# Patient Record
Sex: Female | Born: 1935 | Race: White | Hispanic: No | Marital: Single | Smoking: Never smoker
Health system: Southern US, Community
[De-identification: ages and names within clinical notes are randomized; demographics above are authoritative.]

## PROBLEM LIST (undated history)

## (undated) DIAGNOSIS — IMO0002 Reserved for concepts with insufficient information to code with codable children: Secondary | ICD-10-CM

## (undated) DIAGNOSIS — L982 Febrile neutrophilic dermatosis [Sweet]: Secondary | ICD-10-CM

## (undated) DIAGNOSIS — I4892 Unspecified atrial flutter: Secondary | ICD-10-CM

## (undated) DIAGNOSIS — C9210 Chronic myeloid leukemia, BCR/ABL-positive, not having achieved remission: Secondary | ICD-10-CM

## (undated) DIAGNOSIS — I5032 Chronic diastolic (congestive) heart failure: Secondary | ICD-10-CM

## (undated) DIAGNOSIS — J189 Pneumonia, unspecified organism: Secondary | ICD-10-CM

## (undated) DIAGNOSIS — E871 Hypo-osmolality and hyponatremia: Secondary | ICD-10-CM

## (undated) DIAGNOSIS — J849 Interstitial pulmonary disease, unspecified: Secondary | ICD-10-CM

## (undated) DIAGNOSIS — D75839 Thrombocytosis, unspecified: Secondary | ICD-10-CM

## (undated) DIAGNOSIS — D509 Iron deficiency anemia, unspecified: Secondary | ICD-10-CM

## (undated) DIAGNOSIS — D473 Essential (hemorrhagic) thrombocythemia: Secondary | ICD-10-CM

## (undated) HISTORY — PX: OTHER SURGICAL HISTORY: SHX169

## (undated) HISTORY — DX: Unspecified atrial flutter: I48.92

## (undated) HISTORY — DX: Pneumonia, unspecified organism: J18.9

## (undated) HISTORY — DX: Thrombocytosis, unspecified: D75.839

## (undated) HISTORY — DX: Iron deficiency anemia, unspecified: D50.9

## (undated) HISTORY — DX: Chronic myeloid leukemia, BCR/ABL-positive, not having achieved remission: C92.10

## (undated) HISTORY — DX: Interstitial pulmonary disease, unspecified: J84.9

## (undated) HISTORY — DX: Chronic diastolic (congestive) heart failure: I50.32

## (undated) HISTORY — DX: Essential (hemorrhagic) thrombocythemia: D47.3

## (undated) HISTORY — DX: Febrile neutrophilic dermatosis (sweet): L98.2

## (undated) HISTORY — DX: Hypo-osmolality and hyponatremia: E87.1

## (undated) HISTORY — DX: Reserved for concepts with insufficient information to code with codable children: IMO0002

---

## 2008-07-24 ENCOUNTER — Emergency Department (HOSPITAL_COMMUNITY): Admission: EM | Admit: 2008-07-24 | Discharge: 2008-07-24 | Payer: Self-pay | Admitting: Emergency Medicine

## 2010-12-06 ENCOUNTER — Other Ambulatory Visit: Payer: Self-pay | Admitting: Family Medicine

## 2010-12-06 ENCOUNTER — Ambulatory Visit
Admission: RE | Admit: 2010-12-06 | Discharge: 2010-12-06 | Disposition: A | Discharge status: Home / Self Care | Payer: Medicare Other | Source: Ambulatory Visit | Attending: Family Medicine | Admitting: Family Medicine

## 2010-12-06 DIAGNOSIS — J189 Pneumonia, unspecified organism: Secondary | ICD-10-CM

## 2010-12-08 ENCOUNTER — Inpatient Hospital Stay (HOSPITAL_COMMUNITY): Payer: Medicare Other

## 2010-12-08 ENCOUNTER — Emergency Department (HOSPITAL_COMMUNITY): Payer: Medicare Other

## 2010-12-08 ENCOUNTER — Inpatient Hospital Stay (HOSPITAL_COMMUNITY)
Admission: EM | Admit: 2010-12-08 | Discharge: 2010-12-11 | Disposition: A | Discharge status: Home / Self Care | Payer: Medicare Other | Source: Home / Self Care | Attending: Cardiology | Admitting: Cardiology

## 2010-12-08 DIAGNOSIS — G929 Unspecified toxic encephalopathy: Secondary | ICD-10-CM | POA: Diagnosis present

## 2010-12-08 DIAGNOSIS — Z7901 Long term (current) use of anticoagulants: Secondary | ICD-10-CM

## 2010-12-08 DIAGNOSIS — I4891 Unspecified atrial fibrillation: Secondary | ICD-10-CM | POA: Diagnosis present

## 2010-12-08 DIAGNOSIS — I5033 Acute on chronic diastolic (congestive) heart failure: Secondary | ICD-10-CM | POA: Diagnosis present

## 2010-12-08 DIAGNOSIS — E871 Hypo-osmolality and hyponatremia: Secondary | ICD-10-CM | POA: Diagnosis present

## 2010-12-08 DIAGNOSIS — G92 Toxic encephalopathy: Secondary | ICD-10-CM | POA: Diagnosis present

## 2010-12-08 DIAGNOSIS — I509 Heart failure, unspecified: Secondary | ICD-10-CM | POA: Diagnosis present

## 2010-12-08 DIAGNOSIS — D72829 Elevated white blood cell count, unspecified: Secondary | ICD-10-CM | POA: Diagnosis not present

## 2010-12-08 DIAGNOSIS — J189 Pneumonia, unspecified organism: Secondary | ICD-10-CM | POA: Diagnosis present

## 2010-12-08 DIAGNOSIS — D509 Iron deficiency anemia, unspecified: Secondary | ICD-10-CM | POA: Diagnosis present

## 2010-12-08 DIAGNOSIS — I959 Hypotension, unspecified: Secondary | ICD-10-CM | POA: Diagnosis present

## 2010-12-08 DIAGNOSIS — D473 Essential (hemorrhagic) thrombocythemia: Secondary | ICD-10-CM | POA: Diagnosis present

## 2010-12-08 DIAGNOSIS — J8410 Pulmonary fibrosis, unspecified: Secondary | ICD-10-CM | POA: Diagnosis present

## 2010-12-08 DIAGNOSIS — I4892 Unspecified atrial flutter: Secondary | ICD-10-CM | POA: Diagnosis present

## 2010-12-08 DIAGNOSIS — L538 Other specified erythematous conditions: Secondary | ICD-10-CM | POA: Diagnosis not present

## 2010-12-08 LAB — HEPATIC FUNCTION PANEL
ALT: 13 U/L (ref 0–35)
Alkaline Phosphatase: 82 U/L (ref 39–117)
Indirect Bilirubin: 0.4 mg/dL (ref 0.3–0.9)
Total Protein: 7.4 g/dL (ref 6.0–8.3)

## 2010-12-08 LAB — CBC
HCT: 30.1 % — ABNORMAL LOW (ref 36.0–46.0)
MCV: 80.7 fL (ref 78.0–100.0)
RBC: 3.73 MIL/uL — ABNORMAL LOW (ref 3.87–5.11)
WBC: 23.1 10*3/uL — ABNORMAL HIGH (ref 4.0–10.5)

## 2010-12-08 LAB — POCT CARDIAC MARKERS
CKMB, poc: 1.2 ng/mL (ref 1.0–8.0)
Troponin i, poc: <0.05 ng/mL (ref 0.00–0.09)

## 2010-12-08 LAB — CARDIAC PANEL(CRET KIN+CKTOT+MB+TROPI): Troponin I: <0.3 ng/mL (ref <0.30)

## 2010-12-08 LAB — BASIC METABOLIC PANEL
BUN: 22 mg/dL (ref 6–23)
GFR calc non Af Amer: >60 mL/min (ref >60)
Potassium: 4.8 mEq/L (ref 3.5–5.1)
Sodium: 131 mEq/L — ABNORMAL LOW (ref 135–145)

## 2010-12-08 LAB — APTT: aPTT: 32 seconds (ref 24–37)

## 2010-12-08 LAB — PROTIME-INR: Prothrombin Time: 14.9 seconds (ref 11.6–15.2)

## 2010-12-09 ENCOUNTER — Inpatient Hospital Stay (HOSPITAL_COMMUNITY): Payer: Medicare Other

## 2010-12-09 LAB — BASIC METABOLIC PANEL
CO2: 23 mEq/L (ref 19–32)
Calcium: 8.7 mg/dL (ref 8.4–10.5)
Chloride: 97 mEq/L (ref 96–112)
Glucose, Bld: 101 mg/dL — ABNORMAL HIGH (ref 70–99)
Potassium: 4.4 mEq/L (ref 3.5–5.1)
Sodium: 131 mEq/L — ABNORMAL LOW (ref 135–145)

## 2010-12-09 LAB — CARDIAC PANEL(CRET KIN+CKTOT+MB+TROPI)
CK, MB: 1.3 ng/mL (ref 0.3–4.0)
Relative Index: INVALID (ref 0.0–2.5)
Total CK: 42 U/L (ref 7–177)

## 2010-12-09 LAB — CBC
HCT: 30.4 % — ABNORMAL LOW (ref 36.0–46.0)
Hemoglobin: 9.8 g/dL — ABNORMAL LOW (ref 12.0–15.0)
MCHC: 32.2 g/dL (ref 30.0–36.0)
RBC: 3.75 MIL/uL — ABNORMAL LOW (ref 3.87–5.11)

## 2010-12-09 LAB — PROTIME-INR: Prothrombin Time: 16.4 seconds — ABNORMAL HIGH (ref 11.6–15.2)

## 2010-12-09 LAB — T4, FREE: Free T4: 0.98 ng/dL (ref 0.80–1.80)

## 2010-12-09 LAB — HEPARIN LEVEL (UNFRACTIONATED): Heparin Unfractionated: 0.34 IU/mL (ref 0.30–0.70)

## 2010-12-10 LAB — BASIC METABOLIC PANEL
BUN: 11 mg/dL (ref 6–23)
Chloride: 99 mEq/L (ref 96–112)
Glucose, Bld: 91 mg/dL (ref 70–99)
Potassium: 4 mEq/L (ref 3.5–5.1)

## 2010-12-10 LAB — DIFFERENTIAL
Basophils Relative: 0 % (ref 0–1)
Eosinophils Relative: 1 % (ref 0–5)
Lymphs Abs: 1.6 10*3/uL (ref 0.7–4.0)
Monocytes Absolute: 0.2 10*3/uL (ref 0.1–1.0)

## 2010-12-10 LAB — CBC
HCT: 30.9 % — ABNORMAL LOW (ref 36.0–46.0)
MCHC: 32.7 g/dL (ref 30.0–36.0)
MCV: 81.1 fL (ref 78.0–100.0)
RDW: 15.9 % — ABNORMAL HIGH (ref 11.5–15.5)

## 2010-12-11 DIAGNOSIS — J189 Pneumonia, unspecified organism: Secondary | ICD-10-CM

## 2010-12-11 HISTORY — DX: Pneumonia, unspecified organism: J18.9

## 2010-12-14 ENCOUNTER — Inpatient Hospital Stay (HOSPITAL_COMMUNITY)
Admission: EM | Admit: 2010-12-14 | Discharge: 2010-12-29 | DRG: 193 | Disposition: A | Discharge status: Home Health | Payer: Medicare Other | Attending: Internal Medicine | Admitting: Internal Medicine

## 2010-12-14 ENCOUNTER — Emergency Department (HOSPITAL_COMMUNITY): Payer: Medicare Other

## 2010-12-14 ENCOUNTER — Encounter: Payer: Medicare Other | Admitting: *Deleted

## 2010-12-14 LAB — CK TOTAL AND CKMB (NOT AT ARMC): Relative Index: INVALID (ref 0.0–2.5)

## 2010-12-14 LAB — CBC
HCT: 28.7 % — ABNORMAL LOW (ref 36.0–46.0)
MCV: 80.4 fL (ref 78.0–100.0)
Platelets: 487 10*3/uL — ABNORMAL HIGH (ref 150–400)
RBC: 3.57 MIL/uL — ABNORMAL LOW (ref 3.87–5.11)
WBC: 26.6 10*3/uL — ABNORMAL HIGH (ref 4.0–10.5)

## 2010-12-14 LAB — COMPREHENSIVE METABOLIC PANEL
Albumin: 2.7 g/dL — ABNORMAL LOW (ref 3.5–5.2)
BUN: 12 mg/dL (ref 6–23)
Chloride: 94 mEq/L — ABNORMAL LOW (ref 96–112)
Creatinine, Ser: 0.67 mg/dL (ref 0.4–1.2)
GFR calc non Af Amer: >60 mL/min (ref >60)
Glucose, Bld: 102 mg/dL — ABNORMAL HIGH (ref 70–99)
Total Bilirubin: 0.3 mg/dL (ref 0.3–1.2)

## 2010-12-14 LAB — CARDIAC PANEL(CRET KIN+CKTOT+MB+TROPI)
Relative Index: INVALID (ref 0.0–2.5)
Total CK: 76 U/L (ref 7–177)
Troponin I: <0.3 ng/mL (ref <0.30)

## 2010-12-14 LAB — APTT: aPTT: 62 seconds — ABNORMAL HIGH (ref 24–37)

## 2010-12-14 LAB — TROPONIN I: Troponin I: <0.3 ng/mL (ref <0.30)

## 2010-12-15 DIAGNOSIS — D72829 Elevated white blood cell count, unspecified: Secondary | ICD-10-CM

## 2010-12-15 DIAGNOSIS — I4891 Unspecified atrial fibrillation: Secondary | ICD-10-CM

## 2010-12-15 LAB — CBC
HCT: 34.8 % — ABNORMAL LOW (ref 36.0–46.0)
MCH: 26.7 pg (ref 26.0–34.0)
MCHC: 33 g/dL (ref 30.0–36.0)
MCV: 80.7 fL (ref 78.0–100.0)
Platelets: 521 10*3/uL — ABNORMAL HIGH (ref 150–400)
RDW: 16.4 % — ABNORMAL HIGH (ref 11.5–15.5)

## 2010-12-15 LAB — CULTURE, BLOOD (ROUTINE X 2)
Culture  Setup Time: 201205191652
Culture: NO GROWTH

## 2010-12-15 LAB — COMPREHENSIVE METABOLIC PANEL
ALT: 32 U/L (ref 0–35)
AST: 50 U/L — ABNORMAL HIGH (ref 0–37)
Albumin: 3 g/dL — ABNORMAL LOW (ref 3.5–5.2)
CO2: 25 mEq/L (ref 19–32)
Chloride: 96 mEq/L (ref 96–112)
Creatinine, Ser: 0.71 mg/dL (ref 0.4–1.2)
GFR calc Af Amer: >60 mL/min (ref >60)
GFR calc non Af Amer: >60 mL/min (ref >60)
Sodium: 132 mEq/L — ABNORMAL LOW (ref 135–145)
Total Bilirubin: 0.3 mg/dL (ref 0.3–1.2)

## 2010-12-15 LAB — DIFFERENTIAL
Basophils Absolute: 0.2 10*3/uL — ABNORMAL HIGH (ref 0.0–0.1)
Basophils Relative: 1 % (ref 0–1)
Eosinophils Absolute: 0.1 10*3/uL (ref 0.0–0.7)
Eosinophils Relative: 0 % (ref 0–5)
Monocytes Absolute: 0.1 10*3/uL (ref 0.1–1.0)
Neutro Abs: 24.2 10*3/uL — ABNORMAL HIGH (ref 1.7–7.7)

## 2010-12-15 LAB — TSH: TSH: 2.202 u[IU]/mL (ref 0.350–4.500)

## 2010-12-15 LAB — SAVE SMEAR

## 2010-12-15 LAB — PRO B NATRIURETIC PEPTIDE: Pro B Natriuretic peptide (BNP): 1963 pg/mL — ABNORMAL HIGH (ref 0–450)

## 2010-12-15 LAB — VITAMIN B12: Vitamin B-12: >2000 pg/mL — ABNORMAL HIGH (ref 211–911)

## 2010-12-15 LAB — CARDIAC PANEL(CRET KIN+CKTOT+MB+TROPI): CK, MB: 1.9 ng/mL (ref 0.3–4.0)

## 2010-12-15 LAB — PHOSPHORUS: Phosphorus: 3.3 mg/dL (ref 2.3–4.6)

## 2010-12-16 DIAGNOSIS — I4892 Unspecified atrial flutter: Secondary | ICD-10-CM

## 2010-12-16 LAB — BASIC METABOLIC PANEL
BUN: 14 mg/dL (ref 6–23)
CO2: 28 mEq/L (ref 19–32)
Calcium: 8.4 mg/dL (ref 8.4–10.5)
Creatinine, Ser: 0.95 mg/dL (ref 0.4–1.2)
Glucose, Bld: 101 mg/dL — ABNORMAL HIGH (ref 70–99)

## 2010-12-16 LAB — CBC
HCT: 31.5 % — ABNORMAL LOW (ref 36.0–46.0)
MCHC: 32.1 g/dL (ref 30.0–36.0)
MCV: 80.6 fL (ref 78.0–100.0)
Platelets: 514 10*3/uL — ABNORMAL HIGH (ref 150–400)
RDW: 16.6 % — ABNORMAL HIGH (ref 11.5–15.5)

## 2010-12-16 LAB — DIFFERENTIAL
Basophils Absolute: 0.3 10*3/uL — ABNORMAL HIGH (ref 0.0–0.1)
Basophils Relative: 1 % (ref 0–1)
Eosinophils Absolute: 0.3 10*3/uL (ref 0.0–0.7)
Monocytes Absolute: 0.3 10*3/uL (ref 0.1–1.0)
Neutro Abs: 23.1 10*3/uL — ABNORMAL HIGH (ref 1.7–7.7)
Neutrophils Relative %: 92 % — ABNORMAL HIGH (ref 43–77)

## 2010-12-17 ENCOUNTER — Inpatient Hospital Stay (HOSPITAL_COMMUNITY): Payer: Medicare Other

## 2010-12-17 DIAGNOSIS — J849 Interstitial pulmonary disease, unspecified: Secondary | ICD-10-CM | POA: Insufficient documentation

## 2010-12-17 HISTORY — DX: Interstitial pulmonary disease, unspecified: J84.9

## 2010-12-17 LAB — BASIC METABOLIC PANEL
CO2: 31 mEq/L (ref 19–32)
Chloride: 95 mEq/L — ABNORMAL LOW (ref 96–112)
Creatinine, Ser: 0.98 mg/dL (ref 0.4–1.2)
GFR calc Af Amer: >60 mL/min (ref >60)
Glucose, Bld: 101 mg/dL — ABNORMAL HIGH (ref 70–99)

## 2010-12-17 LAB — CBC
HCT: 31.3 % — ABNORMAL LOW (ref 36.0–46.0)
MCH: 26 pg (ref 26.0–34.0)
MCV: 80.7 fL (ref 78.0–100.0)
RDW: 16.5 % — ABNORMAL HIGH (ref 11.5–15.5)
WBC: 24 10*3/uL — ABNORMAL HIGH (ref 4.0–10.5)

## 2010-12-17 NOTE — H&P (Signed)
[name], LOTTER NO.:  [id number]  MEDICAL RECORD NO.:  [id number]           PATIENT TYPE:  E  LOCATION:  [facility]                         FACILITY:  [facility]  PHYSICIAN:  Marinda Elk, M.D.DATE OF BIRTH:  1922-07-25  DATE OF ADMISSION:  12/14/2010 DATE OF DISCHARGE:                             HISTORY & PHYSICAL   PRIMARY CARE DOCTOR:  Paula Koirala, MD  CARDIOLOGIST:  Rollene Rotunda, MD, [facility]  CHIEF COMPLAINT:  Palpitations.  HISTORY OF PRESENT ILLNESS:  This is an 77 year old female with past medical history of pneumonia that was treated on Dec 11, 2010.  At that time, she had AFib with RVR and admitted by cardiologist at that time and was discharged.  Comes to the emergency room that was sent by her primary care doctor because he got an EKG because she was complaining of palpitations and found her to be tachycardic going up to 130s.  This morning, she noted some lightheadedness with ambulation, but otherwise felt well.  Denies any chest pain, shortness of breath, or swelling. Nausea, vomiting, or diarrhea, so we were asked to admit and further evaluate.  ALLERGIES:  No known drug allergies.  MEDICATIONS:  She is on, 1. Digoxin 0.125 mg p.o. daily. 2. Avelox 400 mg p.o. daily. 3. Diltiazem 240 mg p.o. daily. 4. Warfarin 5 mg daily. 5. Multivitamin 1 tab daily.  PAST MEDICAL HISTORY: 1. Community-acquired pneumonia. 2. AFib.  SOCIAL HISTORY:  The patient is single, lives alone in her house, she has no children.  She is retired Runner, broadcasting/film/video and has no history of tobacco, alcohol, or drugs.  The patient is fully functional with physical activity, eat healthy diet, and no herbal medications.  FAMILY HISTORY:  Healthy siblings with mother with coronary artery disease at the age of 50.  Her father died of heart attack at the age of 70.  REVIEW OF SYSTEMS:  Ten-point review of system done, pertinent positives per HPI.  CODE STATUS:  Full  code.  PHYSICAL EXAMINATION:  VITAL SIGNS:  Temperature 97, pulse of 123-108 with a blood pressure of 114/64, she was satting 96% on room air, deep breathing 18 times per minute. GENERAL:  She is awake, alert, and oriented x3 in no acute distress. HEENT:  Normocephalic, atraumatic.  Pupils are equally round and reactive to light.  Anicteric.  No pallor.  No carotid bruit.  No thyromegaly.  Head is atraumatic, normocephalic. LUNGS:  She has good air movement.  Clear to auscultation. CARDIOVASCULAR:  She has regular rate and rhythm but tachycardic with a positive S1, S2.  No murmurs, rubs, or gallops. ABDOMEN:  Positive bowel sounds, nontender, nondistended.  Soft. EXTREMITIES:  Positive pulses.  No clubbing, cyanosis, or edema. NEUROLOGIC:  She is awake, alert, and oriented x3 in no acute distress, but nonfocal. SKIN:  No rashes or ulcerations.  LABORATORY DATA:  On admission shows a white count of 26, hemoglobin of 9, platelet count of 487, and her INR is 4.8.  Her troponin is less than 0.30.  Her sodium was 129, potassium 4.2, chloride 194, bicarb 22, glucose of 102, BUN of 12,  and creatinine of 0.2.  LFTs are within normal limits except for her AST which is 53, albumin is 2.5.  Her CK-MB is 2.4.  EKG shows a flutter with variable AV block.  No ST segmental changes.  Also, her QRS of 100 with an incomplete right bundle branch block.  Chest x-ray shows interval improvement of congestive heart failure, probably residual bilateral pleural effusion.  ASSESSMENT/PLAN: 1. Atrial flutter with palpitations.  We will continue hydration, we     will start her on IV diltiazem and continue her Coumadin.  Her INR     is high, so we will hold her Coumadin for 1-2 days.  We will check     her dig level, monitor all electrolytes, we will check a TSH and a     B12.  We will order Coumadin per pharmacy.  We will cycle cardiac     enzymes in case she is having an myocardial infarction.  At this      time, she says she is chest painfree and no angina recorded with     acute coronary syndrome along with differential. 2. Hyponatremia.  We will check urinary sodium and urinary creatinine.     We will also check a urine osmolarity and serum osmolarity.  We     will start her on IV fluids.  Recheck her B-MET in the morning.     This is probably secondary to failed outpatient treatment     pneumonia. 3. Failed community-acquired pneumonia treatment.  She has been     complaining of cough.  She has been on Avelox.  Her white counts     continues to be high at 26.  Before she left the hospital, it was     23.  We will d/c her on Avelox and we will start her on     vancomycin and Zosyn.  If there is no improvement, we will get a CT     scan in the morning to further evaluate for any cough, signs of     abscess, or empyema.  She does have bilateral pleural effusions at     this time. Also in the differential Myeliproliferative disorder.     High Plt's and WBC and Low hbg.     Marinda Elk, M.D.     AF/MEDQ  D:  12/14/2010  T:  12/14/2010  Job:  [id number]  cc:   Darrow Bussing, MD  Electronically Signed by Marinda Elk M.D. on 12/17/2010 13:08:65 PM

## 2010-12-18 DIAGNOSIS — J84112 Idiopathic pulmonary fibrosis: Secondary | ICD-10-CM

## 2010-12-18 LAB — PROTIME-INR: INR: 2.56 — ABNORMAL HIGH (ref 0.00–1.49)

## 2010-12-18 LAB — BASIC METABOLIC PANEL
BUN: 18 mg/dL (ref 6–23)
Chloride: 95 mEq/L — ABNORMAL LOW (ref 96–112)
GFR calc Af Amer: >60 mL/min (ref >60)
Potassium: 3.8 mEq/L (ref 3.5–5.1)

## 2010-12-19 ENCOUNTER — Inpatient Hospital Stay (HOSPITAL_COMMUNITY): Payer: Medicare Other

## 2010-12-19 DIAGNOSIS — J84112 Idiopathic pulmonary fibrosis: Secondary | ICD-10-CM

## 2010-12-19 DIAGNOSIS — D72829 Elevated white blood cell count, unspecified: Secondary | ICD-10-CM

## 2010-12-19 LAB — IRON AND TIBC
Iron: 27 ug/dL — ABNORMAL LOW (ref 42–135)
Saturation Ratios: 16 % — ABNORMAL LOW (ref 20–55)
TIBC: 173 ug/dL — ABNORMAL LOW (ref 250–470)
UIBC: 146 ug/dL

## 2010-12-19 LAB — URINALYSIS, ROUTINE W REFLEX MICROSCOPIC
Glucose, UA: NEGATIVE mg/dL
Leukocytes, UA: NEGATIVE
Specific Gravity, Urine: 1.024 (ref 1.005–1.030)
Urobilinogen, UA: 1 mg/dL (ref 0.0–1.0)

## 2010-12-19 LAB — CREATININE, URINE, RANDOM: Creatinine, Urine: 93.32 mg/dL

## 2010-12-19 LAB — URINE MICROSCOPIC-ADD ON

## 2010-12-19 LAB — CBC
Hemoglobin: 9.9 g/dL — ABNORMAL LOW (ref 12.0–15.0)
MCH: 25.9 pg — ABNORMAL LOW (ref 26.0–34.0)
MCHC: 31.8 g/dL (ref 30.0–36.0)
MCV: 81.4 fL (ref 78.0–100.0)
RBC: 3.82 MIL/uL — ABNORMAL LOW (ref 3.87–5.11)

## 2010-12-19 LAB — OSMOLALITY, URINE: Osmolality, Ur: 502 mOsm/kg (ref 390–1090)

## 2010-12-19 LAB — RHEUMATOID FACTOR: Rhuematoid fact SerPl-aCnc: 59 IU/mL — ABNORMAL HIGH (ref <=14)

## 2010-12-19 LAB — PROTIME-INR: Prothrombin Time: 30.4 seconds — ABNORMAL HIGH (ref 11.6–15.2)

## 2010-12-19 LAB — PULMONARY FUNCTION TEST

## 2010-12-19 NOTE — Consult Note (Signed)
  [name], [name]               ACCOUNT NO.:  [id number]  MEDICAL RECORD NO.:  [id number]           PATIENT TYPE:  I  LOCATION:  4729                         FACILITY:  [facility]  PHYSICIAN:  Charlcie Cradle. Delford Field, MD, FCCPDATE OF BIRTH:  07/19/25  DATE OF CONSULTATION:  12/18/2010 DATE OF DISCHARGE:                                CONSULTATION   CHIEF COMPLAINT:  Cough, underlying interstitial disease.  HISTORY OF PRESENT ILLNESS:  An 77 year old previously healthy woman admitted between Dec 10, 2010 and Dec 11, 2010 for community-acquired pneumonia, new-onset atrial fibrillation, rapid ventricular response. She was discharged home and seen in followup on Dec 14, 2010, by her primary care physician with persistent cough.  Because of this, she was readmitted.  There was a question of heart failure on the chest x-ray. CT scan did show emphysema, bronchiectasis, and mild fibrosis.  She currently denies any recurrent respiratory complaints.  Her cough is now improved.  She is referred now for the evaluation of the interstitial disease.  Medications currently digoxin, diltiazem, multivitamins, Coumadin, Zosyn, and vancomycin.  PAST HISTORY:  History of atrial fibrillation, rapid ventricular response, now on Coumadin, history of community-acquired pneumonia, echocardiogram showing EF 60%, pericardial effusion, partial hysterectomy.  She denies chemical exposure, asbestos smoke exposure, or other exposure histories.  SOCIAL HISTORY:  Lives alone in [place], retired Editor, commissioning of middle school, never smoked.  FAMILY HISTORY:  Noncontributory.  REVIEW OF SYSTEMS:  Taken in detail and is negative for all systems reviewed except for aching of the chest wall.  She is a full code.  PHYSICAL EXAMINATION:  VITAL SIGNS:  Temperature 98, blood pressure 116/63, pulse 96, respirations 18, saturation 94% on room air. CHEST:  Dry rales at the bases with a picture of faint Velcro in  nature. NEUROLOGIC:  Awake and alert.  Cranial nerves II through XII intact. Strength 5/5. HEENT:  Normocephalic, atraumatic. NECK:  Supple.  No lymphadenopathy. CARDIOVASCULAR:  Normal S1 and S2.  No S3 or S4.  Irregular regular and rhythm.  No murmur, rub, or gallop. GI:  Abdomen soft, nontender.  Bowel sounds active. MUSCULOSKELETAL:  MAE.  No joint deformities.  RADIOLOGY:  Chest x-ray showed no active infiltrate, but the CT scan shows very fine interstitial changes, which were minimal and bronchiectasis and emphysematous changes.  Sodium 132, potassium 3.8, chloride 95, CO2 of 29, BUN 18, creatinine 0.8, blood sugar 101.  White count 24,000, hemoglobin 10.  Blood cultures were negative.  Impression is that of interstitial disease with emphysematous lung disease.  No real environmental exposure.  No smoking exposure except for perhaps passive exposure when she was younger.  Recent pneumonia now cleared.  May have reflux-induced coughing.  RECOMMENDATIONS:  Check autoimmune panel, check full pulmonary function studies, but no further diagnosis or sampling of tissue was needed, would avoid amiodarone in this patient.     Charlcie Cradle Delford Field, MD, [facility]     PEW/MEDQ  D:  12/18/2010  T:  12/18/2010  Job:  [id number]  cc:   Darrow Bussing, MD  Electronically Signed by Shan Levans MD FCCP on 12/19/2010 08:59:17 PM

## 2010-12-20 ENCOUNTER — Encounter: Payer: Self-pay | Admitting: Physician Assistant

## 2010-12-20 LAB — ANA: Anti Nuclear Antibody(ANA): NEGATIVE

## 2010-12-21 ENCOUNTER — Encounter: Payer: Medicare Other | Admitting: *Deleted

## 2010-12-21 LAB — CBC
HCT: 35.4 % — ABNORMAL LOW (ref 36.0–46.0)
Hemoglobin: 11.5 g/dL — ABNORMAL LOW (ref 12.0–15.0)
MCH: 26.5 pg (ref 26.0–34.0)
MCHC: 33 g/dL (ref 30.0–36.0)
MCV: 81.6 fL (ref 78.0–100.0)
Platelets: 617 10*3/uL — ABNORMAL HIGH (ref 150–400)
RBC: 4.34 MIL/uL (ref 3.87–5.11)
RDW: 16.9 % — ABNORMAL HIGH (ref 11.5–15.5)
WBC: 42.3 10*3/uL — ABNORMAL HIGH (ref 4.0–10.5)
WBC: 42.6 10*3/uL — ABNORMAL HIGH (ref 4.0–10.5)

## 2010-12-21 LAB — CULTURE, BLOOD (ROUTINE X 2)
Culture  Setup Time: 201205250136
Culture: NO GROWTH

## 2010-12-21 LAB — BASIC METABOLIC PANEL
CO2: 26 mEq/L (ref 19–32)
Calcium: 8.5 mg/dL (ref 8.4–10.5)
GFR calc Af Amer: >60 mL/min (ref >60)
GFR calc non Af Amer: 60 mL/min — ABNORMAL LOW (ref >60)
Glucose, Bld: 98 mg/dL (ref 70–99)
Potassium: 3.9 mEq/L (ref 3.5–5.1)
Sodium: 133 mEq/L — ABNORMAL LOW (ref 135–145)

## 2010-12-21 LAB — DIFFERENTIAL
Basophils Absolute: 0 10*3/uL (ref 0.0–0.1)
Basophils Relative: 0 % (ref 0–1)
Basophils Relative: 1 % (ref 0–1)
Eosinophils Absolute: 0 10*3/uL (ref 0.0–0.7)
Eosinophils Relative: 1 % (ref 0–5)
Eosinophils Relative: 0 % (ref 0–5)
Lymphocytes Relative: 3 % — ABNORMAL LOW (ref 12–46)
Lymphocytes Relative: 3 % — ABNORMAL LOW (ref 12–46)
Lymphs Abs: 1.3 10*3/uL (ref 0.7–4.0)
Monocytes Absolute: 0.4 10*3/uL (ref 0.1–1.0)
Monocytes Relative: 0 % — ABNORMAL LOW (ref 3–12)
Neutro Abs: 40.6 10*3/uL — ABNORMAL HIGH (ref 1.7–7.7)
Neutrophils Relative %: 95 % — ABNORMAL HIGH (ref 43–77)

## 2010-12-21 LAB — TECHNOLOGIST SMEAR REVIEW

## 2010-12-21 NOTE — Discharge Summary (Signed)
[name], [name] NO.:  [id number]  MEDICAL RECORD NO.:  [id number]           PATIENT TYPE:  I  LOCATION:  4729                         FACILITY:  [facility]  PHYSICIAN:  Marinda Elk, M.D.DATE OF BIRTH:  1922-12-26  DATE OF ADMISSION:  12/14/2010 DATE OF DISCHARGE:                              DISCHARGE SUMMARY   PRIMARY CARE PHYSICIAN:  Dibas Koirala, MD  CARDIOLOGIST:  Rollene Rotunda, MD, [facility]  PULMONOLOGIST:  Charlcie Cradle. Delford Field, MD, [facility]  ONCOLOGIST:  Rose Phi. Myna Hidalgo, MD  DISCHARGE DIAGNOSES: 1. Atrial fibrillation with atrial flutter, currently rate controlled     with therapeutic INR. 2. Interstitial lung disease. 3. Microcytic anemia. 4. Thrombocytosis. 5. Hyponatremia.  DISCHARGE MEDICATIONS: 1. Diltiazem 180 mg 1 tablet daily. 2. Digoxin 0.25 mg p.o. daily. 3. Multivitamin 1 tablet daily. 4. Warfarin 5 mg at bedtime.  PROCEDURES PERFORMED:  CT scan of the chest that showed chronic interstitial lung disease and T6 fracture.  Chest x-ray showed interval improvement in congestive heart failure, probable tiny residual bilateral pleural effusion.  CONSULTATIONS: 1. Rollene Rotunda, MD, [facility], Cardiology. 2. Charlcie Cradle. Delford Field, MD, [facility], pulmonologist. 3. Josph Macho, MD, hematologist.  BRIEF ADMITTING HISTORY AND PHYSICAL:  This is an 77 year old female with past medical history of pneumonia, treated on Dec 15, 2010.  At that time, she had AFib with RVR, admitted by a cardiologist and discharged home, comes to the emergency room.  She went to see her primary care doctor because he got an EKG and she was complaining of palpitations.  Her heart rate was up to 130 with some lightheadedness, so she was sent here for further evaluation.  Please refer to the dictation of Dec 14, 2010 for details.  ASSESSMENT AND PLAN: 1. Atrial fibrillation with atrial flutter.  At the beginning, she was      put on a diltiazem drip which and well rate  controlled.  Then she      was changed to 300 p.o. daily but this made her blood pressure go low,      then she was changed to a lower dose and switched     back to q.i.d. 30 which she tolerated well.  Regular heart rate     being borderline around 80-90.  She was changed to 180. She was sent      on diltiazem 180mg  once a day and she is currently therapeutic. 2. Interstitial lung disease.  On admission, her white count was 26.     She was started on vancomycin and Zosyn, thinking of failed her     community-acquired ammonia.  Her white count did not come down.  She     did not have any fevers.  A CT scan showed interstitial lung     disease.  Her vancomycin and Zosyn were stopped and     Pulmonology was consulted for autoimmune workup and PFTs which were     pending at the time of this dictation.  She will follow up on the     PFTs and autoimmune panel as an outpatient with [place]  pulmonologist. 3. Microcytic anemia.  Her hemoglobin remained stable.  This at the     beginning was thought to be related to her increasing white count  and platelet count.  Oncology was consulted.  They order labs for     probable myeloproliferative disorder.  These labs were pending at     the time of this dictation.  She will follow up with Dr. Myna Hidalgo as     an outpatient. 4. Thrombocytosis.  Dr. Myna Hidalgo will follow up as an outpatient.  Her     platelets have remained stable.  DISCHARGE VITAL SIGNS:  Temperature 97, pulse of 86, respirations 16, blood pressure 112/66, and she was saturating 94% on room air.  DISCHARGE LABORATORY DATA:  ESR of 90 and PT of 2.9.  Her white count remained high at 25, hemoglobin of 9.9, and platelet count 549.  Her urinary sodium was 69 and her creatinine was 93.  Her UA showed 7-10 red blood cells.     Marinda Elk, M.D.     AF/MEDQ  D:  12/19/2010  T:  12/19/2010  Job:  [id number]  cc:   Rollene Rotunda, MD, [facility] Charlcie Cradle. Delford Field, MD, [facility] Josph Macho, M.D.  Electronically Signed by Marinda Elk M.D. on 12/21/2010 06:51:11 AM

## 2010-12-22 ENCOUNTER — Other Ambulatory Visit: Payer: Self-pay | Admitting: Dermatology

## 2010-12-22 DIAGNOSIS — D72829 Elevated white blood cell count, unspecified: Secondary | ICD-10-CM

## 2010-12-22 LAB — BASIC METABOLIC PANEL
Calcium: 8.5 mg/dL (ref 8.4–10.5)
GFR calc Af Amer: >60 mL/min (ref >60)
GFR calc non Af Amer: 57 mL/min — ABNORMAL LOW (ref >60)
Glucose, Bld: 132 mg/dL — ABNORMAL HIGH (ref 70–99)
Sodium: 131 mEq/L — ABNORMAL LOW (ref 135–145)

## 2010-12-22 LAB — CBC
MCHC: 32.2 g/dL (ref 30.0–36.0)
Platelets: 649 10*3/uL — ABNORMAL HIGH (ref 150–400)
RDW: 17 % — ABNORMAL HIGH (ref 11.5–15.5)
WBC: 38.2 10*3/uL — ABNORMAL HIGH (ref 4.0–10.5)

## 2010-12-22 LAB — DIFFERENTIAL
Basophils Absolute: 0.4 10*3/uL — ABNORMAL HIGH (ref 0.0–0.1)
Eosinophils Absolute: 0.4 10*3/uL (ref 0.0–0.7)
Lymphocytes Relative: 4 % — ABNORMAL LOW (ref 12–46)
Monocytes Absolute: 0 10*3/uL — ABNORMAL LOW (ref 0.1–1.0)
Neutrophils Relative %: 94 % — ABNORMAL HIGH (ref 43–77)

## 2010-12-22 LAB — PROTIME-INR
INR: 2.89 — ABNORMAL HIGH (ref 0.00–1.49)
Prothrombin Time: 30.3 seconds — ABNORMAL HIGH (ref 11.6–15.2)

## 2010-12-22 LAB — PRO B NATRIURETIC PEPTIDE: Pro B Natriuretic peptide (BNP): 1518 pg/mL — ABNORMAL HIGH (ref 0–450)

## 2010-12-22 NOTE — Consult Note (Addendum)
[name], [name] NO.:  [id number]  MEDICAL RECORD NO.:  [id number]  LOCATION:  4729                         FACILITY:  [facility]  PHYSICIAN:  Hillis Range, MD       DATE OF BIRTH:  [birth year]  DATE OF CONSULTATION: DATE OF DISCHARGE:                                CONSULTATION   PRIMARY CARDIOLOGIST:  Rollene Rotunda, MD, [facility].  PRIMARY MEDICAL DOCTOR:  Dibas Koirala, MD  CHIEF COMPLAINT:  Cough.  HISTORY OF PRESENT ILLNESS:  [name] is a pleasant 77 year old fairly functional female with the history pertinent for recent diagnosis of AFib with RVR and setting of community-acquired pneumonia and leukocytosis who was admitted with a general complaint, no feeling well. She saw her primary care provider after having been discharged from the hospital for followup appointment and complained of general fatigue and persistent cough, which brought her in to the hospital the first time round. She was noted to have a heart rate of 153, and subsequently transferred to [facility].  Upon initial evaluation, she was found to be in atrial flutter and was treated with IV diltiazem for rate control.  She was subsequently changed to her home medications of p.o. diltiazem and digoxin with improvement of heart rate to the 70s-80s with some increased in her heart rate for 100-140 with activity.  She feels somewhat better since admission, but endorses good appetite, no chest pain, shortness of breath, palpitation, but so has the significant cough, which is especially aggravated by deep breathing.  She was also being concurrently evaluated by Hem/Onc for elevated white blood cell count. On admission her sodium level was down to 129, improved with IV fluids of 132.  PAST MEDICAL HISTORY: 1. AFib with RVR diagnosed last week, initiated Coumadin at that time. 2. Community acquired pneumonia diagnosed on Dec 02, 2010, and     initially treated with azithromycin,  subsequently discharged on     Avelox, currently on vanc and Zosyn. 3. EF of 60-65% with mild MR and trivial pericardial effusion by echo     on Dec 10, 2010.  MEDICATIONS: 1. Digoxin 0.125 mg daily. 2. Diltiazem 240 mg daily. 3. Multivitamin. 4. Zosyn 3.375 g IV q.8 hours. 5. Vancomycin and Coumadin.  ALLERGIES:  No known drug allergies.  SOCIAL HISTORY:  The patient is single.  She is retired Field seismologist. She denies 1  Patient single. She is retired Runner, broadcasting/film/video. She denies any tobacco, alcohol, or drug abuse.  FAMILY HISTORY:  Positive for coronary artery disease in her mother as well as in her father, who passed away in the 22s with an MI.  Her siblings are healthy.  REVIEW OF SYSTEMS:  No fevers, chills, bright red blood per rectum, melena, or hematemesis.  Please HPI for pertinent positives.  All other systems reviewed and otherwise negative.  LABORATORY DATA:  WBC 262, hemoglobin 11.75, hematocrit 34.8, platelet count 521.  Sodium 132, potassium 4.1, chloride 96, CO2 25, glucose 107, BUN 12, creatinine 0.71, AST 50, albumin 3.0, BNP 1953.  Cardiac enzymes negative x3.  INR 3.94, digoxin 0.5, decreased serum osmolality 273.  EKG, atrial flutter rate of 99 beats per minute  without acute changes.  STUDIES:  Chest x-ray showed interval improvement in CHF, probable findings residual bilateral pleural effusions.  PHYSICAL EXAMINATION:  VITAL SIGNS:  Temperature 97.5, pulse 78, respirations 20, blood pressure 109/63, pulse ox 93% on room air. GENERAL:  This is a pleasant elderly white female, in no acute distress. HEENT:  Normocephalic and atraumatic with extraocular movements intact. Clear sclerae.  Nares without discharge. NECK:  Supple without elevated JVP at 10 cm. HEART:  Auscultation of the heart reveals mostly regular rhythm, occasional regularity, likely due to atrial flutter with variable conduction.  No significant murmurs, rubs, or gallops. LUNGS:  Sounds are  bilateral crackles half way up without wheezes, rales, or rhonchi. ABDOMEN:  Soft, nontender, nondistended.  Positive bowel sounds. EXTREMITIES:  Warm and dry without edema. NEUROLOGICALLY:  She is alert and oriented x3, responds to questions appropriately with normal affect.  ASSESSMENT/PLAN:  The patient was seen and examined by Dr. Johney Frame and myself.  This is an 77 year old female with recent admission for AFib with RVR and the setting of recently diagnosed community-acquired pneumonia who returns with ongoing shortness of breath/chest pain.  She has reasonable rate control of her atrial fibrillation as well as newly diagnosed atrial flutter at this admission, but her heart rate still increases every so often.  At this time, we recommend to increase her Cardizem to 360 mg daily and continue Coumadin for goal INR of 2-3.  On exam, she is mildly volume overloaded with diffuse dry rales and diffuse interstitial opacities and chest x-ray considering for underlying ILD.  We gentle diuresed her for 40 mg IV Lasix and feels she improves.  We would also consider pulmonary consult for chronic lung disease.  Thank you for the opportunity to participate in the care of this patient.     Ronie Spies, P.A.C.   ______________________________ Hillis Range, MD    DD/MEDQ  D:  12/15/2010  T:  12/16/2010  Job:  [id number]  cc:   Rollene Rotunda, MD, [facility] Dibas Docia Chuck, MD  Electronically Signed by Hillis Range MD on 01/14/2011 04:49:21 PM Electronically Signed by Ronie Spies  on 01/16/2011 02:13:01 PM

## 2010-12-23 LAB — BASIC METABOLIC PANEL
CO2: 25 mEq/L (ref 19–32)
GFR calc non Af Amer: >60 mL/min (ref >60)
Glucose, Bld: 128 mg/dL — ABNORMAL HIGH (ref 70–99)
Potassium: 3.9 mEq/L (ref 3.5–5.1)
Sodium: 133 mEq/L — ABNORMAL LOW (ref 135–145)

## 2010-12-23 LAB — DIFFERENTIAL
Eosinophils Relative: 1 % (ref 0–5)
Monocytes Relative: 1 % — ABNORMAL LOW (ref 3–12)

## 2010-12-23 LAB — CBC
HCT: 30.6 % — ABNORMAL LOW (ref 36.0–46.0)
Hemoglobin: 9.7 g/dL — ABNORMAL LOW (ref 12.0–15.0)
RDW: 17.3 % — ABNORMAL HIGH (ref 11.5–15.5)
WBC: 39.3 10*3/uL — ABNORMAL HIGH (ref 4.0–10.5)

## 2010-12-23 LAB — PROTIME-INR
INR: 2.37 — ABNORMAL HIGH (ref 0.00–1.49)
Prothrombin Time: 26 seconds — ABNORMAL HIGH (ref 11.6–15.2)

## 2010-12-24 LAB — CBC
HCT: 30.7 % — ABNORMAL LOW (ref 36.0–46.0)
Hemoglobin: 9.7 g/dL — ABNORMAL LOW (ref 12.0–15.0)
MCHC: 31.6 g/dL (ref 30.0–36.0)
RBC: 3.74 MIL/uL — ABNORMAL LOW (ref 3.87–5.11)

## 2010-12-24 LAB — PROTIME-INR: INR: 1.94 — ABNORMAL HIGH (ref 0.00–1.49)

## 2010-12-25 ENCOUNTER — Encounter: Payer: Medicare Other | Admitting: Physician Assistant

## 2010-12-25 DIAGNOSIS — D469 Myelodysplastic syndrome, unspecified: Secondary | ICD-10-CM

## 2010-12-25 LAB — CBC
HCT: 31.4 % — ABNORMAL LOW (ref 36.0–46.0)
Hemoglobin: 10 g/dL — ABNORMAL LOW (ref 12.0–15.0)
MCV: 83.1 fL (ref 78.0–100.0)
RBC: 3.78 MIL/uL — ABNORMAL LOW (ref 3.87–5.11)
WBC: 52 10*3/uL (ref 4.0–10.5)

## 2010-12-25 LAB — COMPREHENSIVE METABOLIC PANEL
ALT: 15 U/L (ref 0–35)
Alkaline Phosphatase: 74 U/L (ref 39–117)
BUN: 24 mg/dL — ABNORMAL HIGH (ref 6–23)
Chloride: 102 mEq/L (ref 96–112)
Glucose, Bld: 95 mg/dL (ref 70–99)
Potassium: 4 mEq/L (ref 3.5–5.1)
Sodium: 136 mEq/L (ref 135–145)
Total Bilirubin: 0.3 mg/dL (ref 0.3–1.2)

## 2010-12-25 LAB — PROTIME-INR
INR: 2.67 — ABNORMAL HIGH (ref 0.00–1.49)
Prothrombin Time: 28.5 seconds — ABNORMAL HIGH (ref 11.6–15.2)

## 2010-12-25 LAB — DIFFERENTIAL
Blasts: 0 %
Metamyelocytes Relative: 0 %
Monocytes Relative: 3 % (ref 3–12)
Myelocytes: 0 %
Promyelocytes Absolute: 0 %
nRBC: 0 /100 WBC

## 2010-12-26 ENCOUNTER — Inpatient Hospital Stay (HOSPITAL_COMMUNITY): Payer: Medicare Other

## 2010-12-26 LAB — DIFFERENTIAL
Basophils Absolute: 0 10*3/uL (ref 0.0–0.1)
Basophils Relative: 0 % (ref 0–1)
Eosinophils Absolute: 0 10*3/uL (ref 0.0–0.7)
Eosinophils Relative: 0 % (ref 0–5)
Metamyelocytes Relative: 0 %
Myelocytes: 7 %
Neutro Abs: 46.3 10*3/uL — ABNORMAL HIGH (ref 1.7–7.7)
Neutrophils Relative %: 86 % — ABNORMAL HIGH (ref 43–77)
Promyelocytes Absolute: 0 %
nRBC: 0 /100 WBC

## 2010-12-26 LAB — PROTIME-INR: Prothrombin Time: 29.3 seconds — ABNORMAL HIGH (ref 11.6–15.2)

## 2010-12-26 LAB — CBC
Hemoglobin: 9.6 g/dL — ABNORMAL LOW (ref 12.0–15.0)
MCH: 26.1 pg (ref 26.0–34.0)
Platelets: 518 10*3/uL — ABNORMAL HIGH (ref 150–400)
RBC: 3.68 MIL/uL — ABNORMAL LOW (ref 3.87–5.11)
WBC: 49.3 10*3/uL — ABNORMAL HIGH (ref 4.0–10.5)

## 2010-12-27 ENCOUNTER — Other Ambulatory Visit: Payer: Self-pay | Admitting: Interventional Radiology

## 2010-12-27 ENCOUNTER — Inpatient Hospital Stay (HOSPITAL_COMMUNITY): Payer: Medicare Other

## 2010-12-27 LAB — CBC
Hemoglobin: 9.4 g/dL — ABNORMAL LOW (ref 12.0–15.0)
MCH: 26.2 pg (ref 26.0–34.0)
Platelets: 463 10*3/uL — ABNORMAL HIGH (ref 150–400)
RBC: 3.59 MIL/uL — ABNORMAL LOW (ref 3.87–5.11)
WBC: 40.9 10*3/uL — ABNORMAL HIGH (ref 4.0–10.5)

## 2010-12-27 LAB — COMPREHENSIVE METABOLIC PANEL
ALT: 18 U/L (ref 0–35)
AST: 35 U/L (ref 0–37)
Albumin: 2.6 g/dL — ABNORMAL LOW (ref 3.5–5.2)
Alkaline Phosphatase: 69 U/L (ref 39–117)
Calcium: 8.6 mg/dL (ref 8.4–10.5)
GFR calc Af Amer: >60 mL/min (ref >60)
Glucose, Bld: 104 mg/dL — ABNORMAL HIGH (ref 70–99)
Potassium: 4.4 mEq/L (ref 3.5–5.1)
Sodium: 139 mEq/L (ref 135–145)
Total Protein: 6.2 g/dL (ref 6.0–8.3)

## 2010-12-27 LAB — PROTIME-INR
INR: 3.03 — ABNORMAL HIGH (ref 0.00–1.49)
Prothrombin Time: 31.4 seconds — ABNORMAL HIGH (ref 11.6–15.2)

## 2010-12-28 LAB — URINALYSIS, ROUTINE W REFLEX MICROSCOPIC
Glucose, UA: NEGATIVE mg/dL
Ketones, ur: NEGATIVE mg/dL
Leukocytes, UA: NEGATIVE
Protein, ur: NEGATIVE mg/dL
pH: 5.5 (ref 5.0–8.0)

## 2010-12-28 LAB — DIFFERENTIAL
Band Neutrophils: 0 % (ref 0–10)
Basophils Absolute: 0 10*3/uL (ref 0.0–0.1)
Basophils Relative: 0 % (ref 0–1)
Blasts: 0 %
Myelocytes: 0 %
Neutro Abs: 31.8 10*3/uL — ABNORMAL HIGH (ref 1.7–7.7)
Neutrophils Relative %: 84 % — ABNORMAL HIGH (ref 43–77)
Promyelocytes Absolute: 0 %

## 2010-12-28 LAB — CBC
HCT: 28.9 % — ABNORMAL LOW (ref 36.0–46.0)
Hemoglobin: 9.2 g/dL — ABNORMAL LOW (ref 12.0–15.0)
RBC: 3.43 MIL/uL — ABNORMAL LOW (ref 3.87–5.11)

## 2010-12-28 LAB — URINE MICROSCOPIC-ADD ON

## 2010-12-28 LAB — BASIC METABOLIC PANEL
BUN: 33 mg/dL — ABNORMAL HIGH (ref 6–23)
CO2: 27 mEq/L (ref 19–32)
Calcium: 8.8 mg/dL (ref 8.4–10.5)
Creatinine, Ser: 0.72 mg/dL (ref 0.4–1.2)
Glucose, Bld: 124 mg/dL — ABNORMAL HIGH (ref 70–99)

## 2010-12-28 LAB — PROTIME-INR
INR: 2.79 — ABNORMAL HIGH (ref 0.00–1.49)
Prothrombin Time: 29.5 seconds — ABNORMAL HIGH (ref 11.6–15.2)

## 2010-12-28 NOTE — Consult Note (Signed)
NAMESHERALEE, QAZI NO.:  [id number]  MEDICAL RECORD NO.:  [id number]           PATIENT TYPE:  LOCATION:                                 FACILITY:  PHYSICIAN:  Josph Macho, M.D.  DATE OF BIRTH:  12/05/1922  DATE OF CONSULTATION: DATE OF DISCHARGE:                                CONSULTATION   REFERRING PHYSICIAN:  Marinda Elk, MD  REASON FOR CONSULTATION: 1. Chronic leukocytosis. 2. Community-acquired pneumonia. 3. Atrial fibrillation.  HISTORY OF PRESENT ILLNESS:  [name] is a very charming 77 year old white female.  She had been in good health until recently.  She apparently developed community-acquired pneumonia back in early May. She also went into atrial fibrillation.  She was treated with antibiotics with azithromycin.  She was put on anticoagulation.  Cardiology had been seeing her.  She was put on a Cardizem drip during her recent hospitalization.  When she was admitted on Dec 08, 2010, her white cell count was 23,000. Platelet count was 403.  Hemoglobin was 10.  She had a chest x-ray done.  Chest x-ray did not show anything unusual outside other than some interstitial prominence.  She did have a CT of the brain.  This was secondary to acute onset of dysphagia.  CT of the brain was unremarkable.  She was subsequently discharged on Dec 11, 2010.  When she was discharged, her white cell count was 23,000.  Hemoglobin was 10, platelet count 415.  She now is readmitted.  She is readmitted because of palpitations.  She had been on digoxin/Avelox/diltiazem/Coumadin.  She had lab work done which showed a white cell count of 26,000. Hemoglobin was 9 and platelet count was 47.  Her INR was somewhat on the high side of 4.8.  A chest x-ray showed interval improvement of congestive heart failure.  We were asked to see her because of the persistence of her white cell count.  She had blood cultures done back on Dec 09, 2010.  These  were all negative.  A metabolic panel done on Dec 15, 2010, was relatively unrevealing.  Her BUN was 12, creatinine 0.71.  Total protein 7.2 and albumin 3.0.  Her actual CBC on Dec 15, 2010, showed a white cell count of 26, hemoglobin 11.5, hematocrit 34.8, and platelet count of 521.  MCV was 80.  Her digoxin level was low at 0.5.  She had a pro-brain natriuretic peptide of 1963.  She did have TSH which was normal at 2.2.  Vitamin B12 was greater than 2000.  She really cannot give too much in the way of history.  She is really quite charming.  She denies really any past problems of infections.  She denies any abdominal pain.  There are no rashes.  She has had no pruritus.  She has had no double vision or blurred vision.  Her past medical history is remarkable for the new onset atrial fibrillation  Her allergies are none.  Her admission medications were Avelox 400 mg p.o. daily, diltiazem 100 mg IV drip, IV vancomycin per protocol, Zosyn per protocol.  Her social history is negative  for tobacco or alcohol use.  Family history is generally not revealing.  PHYSICAL EXAMINATION:  GENERAL:  This is an elderly white female in no obvious distress. VITAL SIGNS:  Temperature of 99, pulse 82, respiratory rate 18, blood pressure 108/58. HEAD AND NECK:  Normocephalic, atraumatic skull.  She has no ocular or oral lesions.  There is no thrush.  There is no obvious adenopathy in her neck. LUNGS:  With decreased breath sounds throughout in both lung fields. She has some scattered wheezes bilaterally. CARDIAC:  Somewhat tachycardic and regular.  She does have occasional extra beats.  I do not detect any murmur. ABDOMEN:  Soft, good bowel sounds.  There is no focal abdominal mass. There is no fluid wave.  There is no palpable hepatosplenomegaly. EXTREMITIES:  No clubbing, cyanosis, or edema. SKIN:  No rashes, ecchymoses, or petechia. NEUROLOGIC:  No focal neurological deficits.  Her  peripheral smear shows mild anisocytosis.  She has some polychromasia.  She had one nucleated red cell.  She had no teardrop cells.  I saw no schistocytes.  She had no rouleaux formation.  White cells were increased in number.  She had mature polys.  I do not see any immature myeloid cells.  There were no blasts.  I saw no atypical lymphocytes.  Platelets were increased in number.  She did have few large platelets.  IMPRESSION:  [name] is an 77 year old white female with leukocytosis.  She appears have good maturation of her white cell line.  I would really be hard pressed to say that she had a myeloproliferative shoulder.  Her blood smear certainly did not show anything that would suggest a myeloproliferative disorder.  I think that the most significant abnormality of the blood smear was the marked change in her platelets.  She had numerous large platelets.  She had a couple "mega" platelets.  I do not feel the spleen.  I do not feel any palpable lymph glands.  I do not see that she has anything that would represent chronic myeloid leukemia.  There is a very rare and chronic granulocytic or chronic neutrophilic leukemia.  This is a variant of chronic myeloid leukemia. I do not believe you have the same chromosome abnormality (i.e., Philadelphia chromosome) that you have with chronic myeloid leukemia.  [name] certainly is frail.  Performance status is not that good (ECOG 3).  I just would hate to put her through any type of invasive procedure right now.  I will have to believe that her blood counts will improve. Unfortunately, I do not have any old records to see how far back she goes with respect to high white cell count.  She is very pleasant.  She does not seem to be bothered by this.  She is not febrile with this.  We will just follow her along for right now.  I really do not have any specific recommendations for her.  Certainly, I would try to limit doing blood  work on her every day.  I would get a CBC on her every 3 days or so.  There is some degree of anemia.  Again, this might represent some iron deficiency.  Her MCV is a little on the low end of normal. Thrombocytosis is most likely related to iron deficiency anemia in [place].  I had a nice talk with [name] and her sister.  She seems to be old holding up quite well right now.     Josph Macho, M.D.  PRE/MEDQ  D:  12/15/2010  T:  12/16/2010  Job:  [id number]  cc:   Marinda Elk, M.D. Rollene Rotunda, MD, [facility] Docia Chuck, MD  Electronically Signed by Arlan Organ  on 12/28/2010 07:31:47 AM

## 2010-12-29 LAB — CBC
HCT: 29.4 % — ABNORMAL LOW (ref 36.0–46.0)
Hemoglobin: 9.2 g/dL — ABNORMAL LOW (ref 12.0–15.0)
MCHC: 31.3 g/dL (ref 30.0–36.0)
MCV: 85.7 fL (ref 78.0–100.0)
RDW: 20.2 % — ABNORMAL HIGH (ref 11.5–15.5)
WBC: 43 10*3/uL — ABNORMAL HIGH (ref 4.0–10.5)

## 2010-12-29 LAB — DIFFERENTIAL
Blasts: 0 %
Eosinophils Absolute: 0.9 10*3/uL — ABNORMAL HIGH (ref 0.0–0.7)
Eosinophils Relative: 2 % (ref 0–5)
Monocytes Absolute: 0.4 10*3/uL (ref 0.1–1.0)
Monocytes Relative: 1 % — ABNORMAL LOW (ref 3–12)
Neutro Abs: 37.4 10*3/uL — ABNORMAL HIGH (ref 1.7–7.7)
Neutrophils Relative %: 87 % — ABNORMAL HIGH (ref 43–77)
nRBC: 0 /100 WBC

## 2010-12-29 LAB — URINE CULTURE
Culture  Setup Time: 201206070839
Culture: NO GROWTH

## 2010-12-29 LAB — PATHOLOGIST SMEAR REVIEW

## 2010-12-29 LAB — PROTIME-INR: INR: 2.41 — ABNORMAL HIGH (ref 0.00–1.49)

## 2011-01-02 ENCOUNTER — Ambulatory Visit: Payer: Medicare Other | Admitting: Hematology & Oncology

## 2011-01-04 ENCOUNTER — Encounter: Payer: Medicare Other | Admitting: *Deleted

## 2011-01-04 ENCOUNTER — Encounter: Payer: Self-pay | Admitting: Critical Care Medicine

## 2011-01-06 NOTE — Discharge Summary (Signed)
[name], [name] NO.:  [id number]  MEDICAL RECORD NO.:  [id number]  LOCATION:  4729                         FACILITY:  [facility]  PHYSICIAN:  Erick Blinks, MD     DATE OF BIRTH:  1923/01/04  DATE OF ADMISSION:  12/14/2010 DATE OF DISCHARGE:  12/29/2010                              DISCHARGE SUMMARY   PRIMARY CARE PHYSICIAN:  Darrow Bussing, MD  CARDIOLOGIST:  Rollene Rotunda, MD, [facility]  PULMONOLOGIST:  Charlcie Cradle. Delford Field, MD, [facility]  ONCOLOGIST:  Rose Phi. Myna Hidalgo, MD  DISCHARGE DIAGNOSES: 1. Atrial fibrillation, rapid ventricular response, status post TEE-     guided cardioversion on December 26, 2010, anticoagulated with Coumadin. 2. 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 3. Chronic myelomonocytic leukemia, diagnosed on bone marrow biopsy. 4. Toxic-metabolic encephalopathy, improved. 5. Iron-deficiency anemia status post IV iron. 6. Thrombocytosis secondary to iron deficiency, improved. 7. Interstitial lung disease for outpatient followup. 8. Acute-on-chronic diastolic congestive heart failure. 9. Leukocytosis secondary to underlying leukemia.  DISCHARGE MEDICATIONS: 1. Diltiazem CD 240 mg 1 tablet p.o. daily. 2. Prednisone 50 mg p.o. daily. 3. Hydroxyurea 500 mg p.o. daily. 4. Multivitamins 1 tablet p.o. daily. 5. Coumadin 5 mg p.o. at bedtime. 6. Digoxin 0.125 mg p.o. daily. 7. Protonix 40 mg p.o. daily.  ADMISSION HISTORY:  This is an 77 year old female who presents to the emergency room with complaints of palpitation.  She was sent to the emergency room by her primary doctor when she was found to have a heart rate of 130, and was having palpitations.  In the ER she was noted to be in atrial fibrillation without ventricular response.  She was subsequently admitted for further evaluation.  For further details, please refer to the history and physical dictated by Dr. Brien Few on Dec 14, 2010.  HOSPITAL COURSE: 1. Atrial  fibrillation.  The patient was seen in consultation by     [facility] Cardiology.  She underwent transesophageal cardioversion on     December 26, 2010, and is currently maintaining sinus rhythm.  She has     been continued on anticoagulation as well as her diltiazem and     digoxin.  She will follow up with her cardiologist as an outpatient     for further management. 2. Skin rash.  The patient was found to have marked leukocytosis as     well as skin rash.  Initially, this was thought to be secondary to     allergic reaction.  She was seen in consultation by [facility]     Dermatology.  A punch skin biopsy was done, and she was started on     prednisone.  The skin rash continued to get worse.  Results of the     biopsy showed that there was neutrophilic dermatosis or Sweet     syndrome.  Her prednisone was increased from 0.5 mg/kg per day to 1     mg/kg per day.  This has significantly improved her rash.  She will     need to follow up with her fraction with [facility] Dermatology.     An appointment has been set up for her on Monday, likely she  will     need a prolonged taper over the next 4 weeks.  We will defer taper     to her dermatologist.  Due to the association of Sweet syndrome     with underlying malignancy Hematology/Oncology consult was     obtained.  The patient was seen in consultation by Dr. Myna Hidalgo, and     she underwent a bone marrow biopsy on December 27, 2010.  Results of     this were consistent with chronic monocytic leukemia.  The patient     has been started on Hydrea.  She is still having significant     leukocytosis.  She will need to follow up with the     Hematology/Oncology Service in the next 2 weeks at [facility].     She had an appointment with Dr. Arline Asp. 3. Toxic-metabolic encephalopathy.  The patient did have an episode of     confusion and hallucination.  This was thought to be secondary to     steroids, but this has since resolved and the patient is currently      her baseline. 4. Iron-deficiency anemia, thrombocytosis.  The patient was given a     dose of IV iron, and her thrombocytosis is continuing to resolve. 5. Acute-on-chronic diastolic congestive heart failure.  The patient     is clinically seeming to do well without Lasix, which is currently     held.  This can be readdressed by her cardiologist. 6. Interstitial lung disease.  The patient was initially thought to     have a possible pneumonia with an elevated white count, was started     on vancomycin and Zosyn.  She was seen in consultation by the     Pulmonary Service and after reviewing her CT scan, which showed     interstitial lung disease.  Her antibiotics were discontinued.  She     had pulmonary function test done and will follow up with [facility]     Pulmonology as an outpatient.  CONSULTATIONS: 1. Hematology/Oncology, Dr. Myna Hidalgo. 2. Dermatology, Dr. Bufford Buttner. 3. Cardiology, Dr. Antoine Poche. 4. Interventional Radiology.  IMAGING STUDIES: 1. CT chest without contrast showed chronic interstitial lung disease,     old compression fracture.  Chest x-ray of Dec 14, 2010, impression     showing interval improvement in congestive heart failure, probable     tiny residual bilateral pleural effusions. 2. A 2D echocardiogram shows an EF of 60-65%, trivial pericardial     effusion. 3. Transesophageal echocardiogram showed normal LV function with no     evidence of left atrial appendage clot.  DISCHARGE INSTRUCTIONS:  The patient should follow up with her primary care physician in the next 1-2 weeks.  She has scheduled followup appointments with the Cardiology Service, the Hematology/Oncology Service, Dermatology Service, and Pulmonary Service.  She should continue on a heart-healthy low-calorie diet, conduct her activity as tolerated.  She has been set up with home health physical therapy.  She is stable for discharge at this time.  CONDITION AT TIME OF DISCHARGE:   Improved.     Erick Blinks, MD     JM/MEDQ  D:  12/29/2010  T:  12/30/2010  Job:  [id number]  cc:   Darrow Bussing, MD Rollene Rotunda, MD, [facility] Charlcie Cradle. Delford Field, MD, [facility] Josph Macho, M.D.  Electronically Signed by Erick Blinks  on 01/06/2011 03:49:31 PM

## 2011-01-09 ENCOUNTER — Ambulatory Visit (INDEPENDENT_AMBULATORY_CARE_PROVIDER_SITE_OTHER): Payer: Medicare Other | Admitting: *Deleted

## 2011-01-09 ENCOUNTER — Encounter: Payer: Self-pay | Admitting: Physician Assistant

## 2011-01-09 ENCOUNTER — Ambulatory Visit (INDEPENDENT_AMBULATORY_CARE_PROVIDER_SITE_OTHER): Payer: Medicare Other | Admitting: Physician Assistant

## 2011-01-09 VITALS — BP 111/71 | HR 71 | Resp 18 | Ht 61.0 in | Wt 101.0 lb

## 2011-01-09 DIAGNOSIS — Z7901 Long term (current) use of anticoagulants: Secondary | ICD-10-CM

## 2011-01-09 DIAGNOSIS — L538 Other specified erythematous conditions: Secondary | ICD-10-CM

## 2011-01-09 DIAGNOSIS — IMO0002 Reserved for concepts with insufficient information to code with codable children: Secondary | ICD-10-CM

## 2011-01-09 DIAGNOSIS — I509 Heart failure, unspecified: Secondary | ICD-10-CM

## 2011-01-09 DIAGNOSIS — I4891 Unspecified atrial fibrillation: Secondary | ICD-10-CM | POA: Insufficient documentation

## 2011-01-09 DIAGNOSIS — L982 Febrile neutrophilic dermatosis [Sweet]: Secondary | ICD-10-CM | POA: Insufficient documentation

## 2011-01-09 DIAGNOSIS — J84115 Respiratory bronchiolitis interstitial lung disease: Secondary | ICD-10-CM

## 2011-01-09 DIAGNOSIS — I5032 Chronic diastolic (congestive) heart failure: Secondary | ICD-10-CM | POA: Insufficient documentation

## 2011-01-09 DIAGNOSIS — C9210 Chronic myeloid leukemia, BCR/ABL-positive, not having achieved remission: Secondary | ICD-10-CM

## 2011-01-09 DIAGNOSIS — J849 Interstitial pulmonary disease, unspecified: Secondary | ICD-10-CM

## 2011-01-09 LAB — POCT INR: INR: 7.6

## 2011-01-09 NOTE — Assessment & Plan Note (Signed)
She is on prednisone.  She has follow up with dermatology next month.  Her rash is improving.

## 2011-01-09 NOTE — Assessment & Plan Note (Signed)
She sees pulmonology next month.

## 2011-01-09 NOTE — Progress Notes (Signed)
History of Present Illness: Primary Cardiologist:  Dr. Rollene Rotunda  [name] is a 77 y.o. female who was recently admitted 5/18-5/21 with atrial fibrillation with rapid ventricular rate in the setting of community-acquired pneumonia.  She was placed on rate control therapy and Coumadin.  She was readmitted 5/24 with continued symptoms of feeling poorly as well as uncontrolled rate.  She was volume overloaded and diuresed.  She was eventually sent for transesophageal echocardiogram guided cardioversion restoring normal sinus rhythm.  Her other medical problems included interstitial lung disease noted by CT of her chest.  Pulmonary saw the patient and she currently has PFTs that have been done with plans for follow up as an outpatient.  She was also noted to have significantly elevated white blood cell count as well as thrombocytosis in the setting of iron deficiency anemia.  She also had a significant skin rash.  Punch biopsy demonstrated neutrophilic dermatosis or Sweets syndrome.  She was placed on high-dose steroids and did develop toxic metabolic encephalopathy.  Bone marrow biopsy confirmed chronic myelogenous leukemia.  She was seen by hematology/oncology.  Hydrea was started.  Her clinical status improved and she was eventually discharged home 6/8.  She returns today for follow up.  She feels weak.  She is improving.  She denies chest pain.  She denies syncope.  She denies significant shortness of breath.  She is probably NYHA class II-IIb.  She denies orthopnea or PND significant pedal edema.  She denies any palpitations.  She has not seen dermatology or oncology unit.  She has appointments pending next month.  Past Medical History  Diagnosis Date  . Atrial fib/flutter, transient     Coumadin; status post TEE guided cardioversion 6/12  . Interstitial lung disease 12/17/10    CT scan  of chest, showed old compression fracture T6.  . Microcytic anemia     Iron deficient  . Thrombocytosis     . Hyponatremia   . history of Pneumonia 12/11/10  . Chronic diastolic heart failure     Echo 5/12: EF 60-65%, mild MR, mild BAE  . Chronic myelocytic leukemia     Dr. Arline Asp  . Acute neutrophilic dermatosis     Sweet syndrome-prednisone therapy; [facility] dermatology    Current Outpatient Prescriptions  Medication Sig Dispense Refill  . digoxin (LANOXIN) 0.25 MG tablet Take by mouth daily.        Marland Kitchen diltiazem (CARDIZEM CD) 240 MG 24 hr capsule Take 240 mg by mouth daily.        . Multiple Vitamin (MULTIVITAMIN) tablet Take 1 tablet by mouth daily.        . pantoprazole (PROTONIX) 40 MG tablet Take 40 mg by mouth daily.        . predniSONE (DELTASONE) 5 MG tablet Take 5 mg by mouth daily.        Marland Kitchen warfarin (COUMADIN) 5 MG tablet Take 5 mg by mouth at bedtime.        Marland Kitchen DISCONTD: diltiazem (DILACOR XR) 180 MG 24 hr capsule Take by mouth daily.         Allergies: No Known Allergies  Vital Signs: BP 111/71  Pulse 71  Resp 18  Ht 5\' 1"  (1.549 m)  Wt 101 lb (45.813 kg)  BMI 19.08 kg/m2  PHYSICAL EXAM: Well nourished, well developed, in no acute distress HEENT: normal Neck: no JVD Endocrine: No thyromegaly Cardiac:  normal S1, S2; RRR; no murmur Lungs:  clear to auscultation bilaterally, no wheezing, rhonchi or  rales Abd: soft, nontender, no hepatomegaly Ext: Trace bilateral edema Skin: warm and dry; Scattered macular rash about her lower extremities with a few lesions of palpable purpura Neuro:  CNs 2-12 intact, no focal abnormalities noted  EKG:   Normal sinus rhythm, heart rate 71, normal axis, RSR prime in lead V1, no ischemic changes, PAC  ASSESSMENT AND PLAN:

## 2011-01-09 NOTE — Patient Instructions (Signed)
Your physician recommends that you schedule a follow-up appointment in: 6 WEEKS WITH DR. HOCHREIN AS PER SCOTT WEAVER, PA-C  Your physician recommends that you return for lab work in: TODAY BMET, BNP, DIG LEVEL 427.31  Your physician recommends that you schedule a follow-up appointment in: 01/22/11 @ 12:30 PM with Dr. Arline Asp   You have been referred to COUMADIN CLINIC IN OUR OFFICE HERE AT [facility]

## 2011-01-09 NOTE — Assessment & Plan Note (Addendum)
She does not really seem to have a clear understanding of this diagnosis.  She stopped taking hydroxyurea on her own.  I do not feel comfortable answering all of her questions regarding this diagnosis.  I really think she needs to get back to see her oncologist sooner so that all of her questions can be answered.  We will try to arrange sooner follow up for her.

## 2011-01-09 NOTE — Assessment & Plan Note (Signed)
She remains in sinus rhythm.  Her heart rate is controlled.  She has weakness that I think is multifactorial.  No changes to her medications today.  Check a digoxin level.  She has not seen anyone in the Coumadin clinic since discharge from the hospital.  I will make sure that she has Coumadin clinic follow up today.  Follow up with Dr. Antoine Poche in 6 weeks.

## 2011-01-09 NOTE — Assessment & Plan Note (Signed)
Her volume appears stable.  Check a BNP today as well as a basic metabolic panel.

## 2011-01-10 ENCOUNTER — Encounter: Payer: Self-pay | Admitting: Physician Assistant

## 2011-01-10 LAB — BASIC METABOLIC PANEL
BUN: 26 mg/dL — ABNORMAL HIGH (ref 6–23)
CO2: 26 mEq/L (ref 19–32)
Chloride: 103 mEq/L (ref 96–112)
GFR: 88.73 mL/min (ref >60.00)
Glucose, Bld: 105 mg/dL — ABNORMAL HIGH (ref 70–99)
Potassium: 4.7 mEq/L (ref 3.5–5.1)
Sodium: 137 mEq/L (ref 135–145)

## 2011-01-10 LAB — PROTIME-INR
INR: 10.1 ratio (ref 0.8–1.0)
Prothrombin Time: 89 s (ref 10.2–12.4)

## 2011-01-10 MED ORDER — PHYTONADIONE 5 MG PO TABS: 2.5000 mg | ORAL_TABLET | Freq: Once | ORAL | Status: DC

## 2011-01-10 NOTE — Discharge Summary (Signed)
NAMECOLETA, GROSSHANS               ACCOUNT NO.:  [id number]  MEDICAL RECORD NO.:  [id number]           PATIENT TYPE:  I  LOCATION:  3714                         FACILITY:  [facility]  PHYSICIAN:  Rollene Rotunda, MD, FACCDATE OF BIRTH:  [birth year]  DATE OF ADMISSION:  12/08/2010 DATE OF DISCHARGE:  12/11/2010                              DISCHARGE SUMMARY   PRIMARY CARDIOLOGIST:  Dr. Antoine Poche.  DISCHARGE DIAGNOSIS:  Atrial fibrillation with rapid ventricular response.  SECONDARY DIAGNOSES:  Community-acquired pneumonia, diagnosed in Dec 02, 2010.  PROCEDURES: 1. 2-D echocardiogram, Dec 10, 2010, EF of 60% to 65% without regional     wall motion abnormalities.  Mild mitral regurgitation.  Mildly     dilated left atrium.  Trivial pericardial effusion. 2. CT of the head, Dec 09, 2010, showing no acute intracranial     abnormality.  Periventricular white matter lucency consistent with     chronic small vessel ischemic disease.  HISTORY OF PRESENT ILLNESS:  An 78 year old female without significant past medical history, who was recently diagnosed with community-acquired pneumonia by primary care provider prescribed Zithromax 250 mg daily. She had followup with primary care on Dec 08, 2010, during which the patient was found to have an irregular heartbeat.  ECG was performed and showed atrial fibrillation with a rate of 137 beats per minute.  The patient was transported to the [facility] ED for evaluation.  In the ED, the patient had no complaints.  She was admitted for anticoagulation and rate control.  HOSPITAL COURSE:  The patient was initially placed on diltiazem therapy without significant rate improvement and subsequently digoxin was added. It was felt that her pneumonia was likely driving her heart rate and her Zithromax was switched to Avelox.  The patient was placed on heparin as well as Coumadin which is a new medication for her.  The patient remained in atrial fibrillation  with rates in the low 100 at rest although she has had noted rise in rates into the 120s with activity.  We have titrated her medications as much as her blood pressure will tolerate and the patient is insistent on discharge today. She is currently asymptomatic and this seems reasonable.  Of most importance is primary care followup within the next week for pneumonia. We have arranged for Coumadin Clinic followup later this week to reevaluate INR given concomitant antibiotic therapy.  She will be discharged home today in good condition.  DISCHARGE LABORATORY DATA:  Hemoglobin 10.1, hematocrit 30.9, WBC 23.0, and platelets 415.  INR 1.74.  Sodium 134, potassium 4.9, chloride 99, CO2 24, BUN 11, creatinine 0.69, and glucose 91.  Total bilirubin 0.5, alkaline phosphatase 82, AST 29, ALT 13, total protein 7.4, albumin 3.1, calcium 8.7, magnesium 2.4, CK 42, MB 1.3, troponin I less than 0.30. TSH 1.933, free T4 0.98.  MRSA screen was negative.  Blood culture showed no growth to date x2.  DISPOSITION:  The patient was discharged home today in good condition.  FOLLOWUP PLANS AND APPOINTMENT:  The patient is to follow up primary care provider, Dr. Darrow Bussing within the next week.  She is to  follow [facility] on Dec 14, 2010, at 215 p.m.  She is to follow up with Tereso Newcomer physician assistant at [facility] Cardiology on December 25, 2010.  DISCHARGE MEDICATIONS: 1. Digoxin 0.125 mg daily. 2. Diltiazem 240 mg daily. 3. Moxifloxacin 40 mg daily x7 days. 4. Coumadin 5 mg at bedtime. 5. Multivitamin 1 tablet daily.  OUTSTANDING LABORATORY STUDIES:  Followup INR on Thursday, Dec 14, 2010.  DURATION OF DISCHARGE ENCOUNTER:  40 minutes including physician time.     Nicolasa Ducking, ANP   ______________________________ Rollene Rotunda, MD, [facility]    CB/MEDQ  D:  12/11/2010  T:  12/11/2010  Job:  [id number]  cc:   Darrow Bussing, MD  Electronically Signed by  Nicolasa Ducking ANP on 12/14/2010 05:10:40 PM Electronically Signed by Rollene Rotunda MD [facility] on 01/10/2011 11:43:25 AM

## 2011-01-10 NOTE — H&P (Signed)
[name], [name]               ACCOUNT NO.:  [id number]  MEDICAL RECORD NO.:  [id number]           PATIENT TYPE:  I  LOCATION:  2915                         FACILITY:  [facility]  PHYSICIAN:  Rollene Rotunda, MD, FACCDATE OF BIRTH:  02-02-1923  DATE OF ADMISSION:  12/08/2010 DATE OF DISCHARGE:                             HISTORY & PHYSICAL   PRIMARY CARE PHYSICIAN:  Dibas Koirala, MD  CHIEF COMPLAINT:  "I have pneumonia."  HISTORY OF PRESENT ILLNESS:  [name] is an 77 year old very pleasant woman without any known past medical history with an exception of a recently diagnosed community-acquired pneumonia, for which, the patient was evaluated on Dec 02, 2010, by her PCP and was prescribed a Z-Pak. The patient completed 4 out of 5 days.  The patient was seen by her PCP today on Dec 08, 2010, as a followup office visit, during which, the patient was found to have an irregular heart beat.  An EKG was donethat demonstrated an atrial fibrillation with a heart rate in 130's with RVR.  The patient was sent to the emergency department for further evaluation.  The patient denies any dizziness, any syncope, any shortness of breath, any orthopnea, any paroxysmal nocturnal dyspnea, chest pain, palpitations, wheezing, abdominal pain, diarrhea, constipation, dysuria, or leg swelling.  The patient denies any symptoms on exertion or with change of position.  PAST MEDICAL HISTORY:  No prior cardiac catheterization and CABG or echocardiograms.  Community-acquired pneumonia diagnosed on Dec 02, 2010, and treated with azithromycin.  The patient is currently on the day #4.  No history of coronary artery disease, hyperlipidemia, hypertension, or diabetes.  ALLERGIES:  No known drug allergies.  MEDICATIONS: 1. Multivitamin 1 p.o. daily. 2. Azithromycin 250 mg 1 p.o. daily, day 4 out of 5.  Currently in the emergency department, the patient is on Cardizem drip at 10 mg an hour.  SOCIAL  HISTORY:  The patient is single.  She lives alone in her house. She has no children.  She is a retired Editor, commissioning.  No history of tobacco, alcohol, or illicit drug use.  The patient is fully functional, is physically active, eats healthy diet.  No use of herbal medications.  FAMILY MEDICAL HISTORY:  Healthy siblings.  Mother with coronary artery disease at age of 75 years old and father with coronary artery disease at 75 years old.  REVIEW OF SYSTEMS:  Per HPI.  CODE STATUS:  Full.  PHYSICAL EXAMINATION:  VITAL SIGNS:  Temperature of 98.5; pulse rate of 117, currently at 88; respiratory rate of 24, currently at 16; blood pressure of 110/80, currently at 99/61 with Cardizem drip; oxygen saturation is 100% on 2 L via nasal cannula. GENERAL:  The patient is in not apparent distress, alert and oriented x3, watching television. HEENT:  Head is without any palpable lesions.  EOMs are intact bilaterally, PERRL bilaterally.  Sclerae without icterus or conjunctival injection bilaterally.  Oropharynx with moist mucous membranes.  Uvula midline.  No injection with good dentition. NECK:  Supple.  No JVD.  No bruits.  No lymphadenopathy, no thyromegaly. CARDIOVASCULAR:  The patient with irregularly irregular rhythm.  No murmurs or rubs.  No clicks.  Pulses are 2+/4 bilaterally of the upper and lower extremities. LUNGS:  The patient does have significant crackles up to mid thorax bilaterally.  No wheezing with good air movement bilaterally. SKIN:  No rashes or lesions. ABDOMEN:  Nondistended.  No observed pulsatile lesions.  No bruits. Bowel sounds positive, soft.  No rebound or guarding.  No hepatosplenomegaly. GU:  No CVA tenderness bilaterally. EXTREMITIES:  No clubbing, cyanosis, or edema bilaterally with pedal pulses 2+/4 bilaterally. MUSCULOSKELETAL:  No joint deformity or effusions or erythema bilaterally. NEUROLOGIC:  Grossly intact.  Strength in all extremities  5/5 bilaterally.  Chest x-ray as of Dec 06, 2010, demonstrates interstitial changes consistent with pneumonia.  EKG reveals rate of 137, rhythm of AFib, no Q-waves or ST-T changes, axis of 60 degrees.  Normal PR, QRS and QTc interval.  No hypertrophy. No changes from a previous EKG that was provided by Dr. Docia Chuck.  LABORATORY DATA:  White blood count of 23.1, hemoglobin of 10, MCV of 80, platelet count of 403.  Sodium of 131, potassium of 4.8, chloride 97, bicarb 24, BUN of 22, creatinine 0.74, glucose of 106, calcium of 9.1.  Troponin of less than 0.05 with CK of 12.  ASSESSMENT AND PLAN: [name] is an 77 year old white woman with a history of recent community-acquired pneumonia and being admitted with asymptomatic atrial fibrillation with a heart rate in 130s. Initial point-of-care markers and EKG are negative for acute coronary syndrome. There is a question whether the patient's atrial fibrillation is of a new onset versus chronic.   The plan is to admit the patient to a step- down unit, continue with the Cardizem drip with a transition to an oral form of the medication per protocol.  We will cycle cardiac enzymes x2 eight hours apart to rule out ACS.  We also will check TSH and T4 to rule out metabolic etiology.  We will order a 2-D echocardiogram to evaluate for structural heart disease.  We will inititate coumadin.  We will repeat a chest x-ray to reevaluate pulmonary infiltrates.  The patient might need to have a change in antibiotic therapy regimen.  There is a low index of suspicion for pulmonary embolism with a Wells score of zero.  DISPOSITION:  We will transfer the patient to the step-down unit.   The patient's CHADS score is class 1.  She would need a lifetime anticoagulation with Coumadin.  Pros and cons of Coumadin therapy were discussed with the patient, all questions were answered in full.     Deatra Robinson,  MD   ______________________________ Rollene Rotunda, MD, [facility]    NK/MEDQ  D:  12/08/2010  T:  12/09/2010  Job:  [id number]  Electronically Signed by Deatra Robinson  on 12/14/2010 10:38:58 AM Electronically Signed by Rollene Rotunda MD [facility] on 01/10/2011 11:43:20 AM

## 2011-01-15 ENCOUNTER — Inpatient Hospital Stay (HOSPITAL_COMMUNITY)
Admission: EM | Admit: 2011-01-15 | Discharge: 2011-01-25 | DRG: 309 | Disposition: A | Discharge status: Home Health | Payer: Medicare Other | Attending: Internal Medicine | Admitting: Internal Medicine

## 2011-01-15 ENCOUNTER — Ambulatory Visit (INDEPENDENT_AMBULATORY_CARE_PROVIDER_SITE_OTHER): Payer: Medicare Other | Admitting: *Deleted

## 2011-01-15 ENCOUNTER — Emergency Department (HOSPITAL_COMMUNITY): Payer: Medicare Other

## 2011-01-15 DIAGNOSIS — Z7901 Long term (current) use of anticoagulants: Secondary | ICD-10-CM

## 2011-01-15 DIAGNOSIS — E871 Hypo-osmolality and hyponatremia: Secondary | ICD-10-CM | POA: Diagnosis present

## 2011-01-15 DIAGNOSIS — IMO0002 Reserved for concepts with insufficient information to code with codable children: Secondary | ICD-10-CM

## 2011-01-15 DIAGNOSIS — I4891 Unspecified atrial fibrillation: Secondary | ICD-10-CM

## 2011-01-15 DIAGNOSIS — C9210 Chronic myeloid leukemia, BCR/ABL-positive, not having achieved remission: Secondary | ICD-10-CM | POA: Diagnosis present

## 2011-01-15 DIAGNOSIS — R5381 Other malaise: Secondary | ICD-10-CM | POA: Diagnosis present

## 2011-01-15 DIAGNOSIS — D72829 Elevated white blood cell count, unspecified: Secondary | ICD-10-CM | POA: Diagnosis present

## 2011-01-15 DIAGNOSIS — D509 Iron deficiency anemia, unspecified: Secondary | ICD-10-CM | POA: Diagnosis present

## 2011-01-15 DIAGNOSIS — I951 Orthostatic hypotension: Secondary | ICD-10-CM | POA: Diagnosis present

## 2011-01-15 DIAGNOSIS — J8410 Pulmonary fibrosis, unspecified: Secondary | ICD-10-CM | POA: Diagnosis present

## 2011-01-15 DIAGNOSIS — L538 Other specified erythematous conditions: Secondary | ICD-10-CM | POA: Diagnosis present

## 2011-01-15 DIAGNOSIS — Z7982 Long term (current) use of aspirin: Secondary | ICD-10-CM

## 2011-01-15 DIAGNOSIS — K219 Gastro-esophageal reflux disease without esophagitis: Secondary | ICD-10-CM | POA: Diagnosis present

## 2011-01-15 DIAGNOSIS — I509 Heart failure, unspecified: Secondary | ICD-10-CM | POA: Diagnosis present

## 2011-01-15 DIAGNOSIS — R5383 Other fatigue: Secondary | ICD-10-CM | POA: Diagnosis present

## 2011-01-15 DIAGNOSIS — R300 Dysuria: Secondary | ICD-10-CM | POA: Diagnosis present

## 2011-01-15 DIAGNOSIS — F068 Other specified mental disorders due to known physiological condition: Secondary | ICD-10-CM | POA: Diagnosis present

## 2011-01-15 DIAGNOSIS — I5032 Chronic diastolic (congestive) heart failure: Secondary | ICD-10-CM | POA: Diagnosis present

## 2011-01-15 LAB — CBC
Hemoglobin: 11.7 g/dL — ABNORMAL LOW (ref 12.0–15.0)
MCH: 29 pg (ref 26.0–34.0)
Platelets: 178 10*3/uL (ref 150–400)
RBC: 4.03 MIL/uL (ref 3.87–5.11)

## 2011-01-15 LAB — POCT I-STAT, CHEM 8
HCT: 35 % — ABNORMAL LOW (ref 36.0–46.0)
Hemoglobin: 11.9 g/dL — ABNORMAL LOW (ref 12.0–15.0)
Potassium: 3.8 mEq/L (ref 3.5–5.1)
Sodium: 133 mEq/L — ABNORMAL LOW (ref 135–145)
TCO2: 21 mmol/L (ref 0–100)

## 2011-01-15 LAB — POCT INR: INR: 1.1

## 2011-01-15 LAB — URINALYSIS, ROUTINE W REFLEX MICROSCOPIC
Bilirubin Urine: NEGATIVE
Specific Gravity, Urine: 1.016 (ref 1.005–1.030)
pH: 6 (ref 5.0–8.0)

## 2011-01-15 LAB — CK TOTAL AND CKMB (NOT AT ARMC)
CK, MB: 3 ng/mL (ref 0.3–4.0)
Total CK: 52 U/L (ref 7–177)

## 2011-01-15 LAB — PROTIME-INR
INR: 1.12 (ref 0.00–1.49)
INR: 1.02 (ref 0.00–1.49)
Prothrombin Time: 14.6 seconds (ref 11.6–15.2)
Prothrombin Time: 13.6 seconds (ref 11.6–15.2)

## 2011-01-15 LAB — DIFFERENTIAL
Basophils Absolute: 0 10*3/uL (ref 0.0–0.1)
Eosinophils Absolute: 0 10*3/uL (ref 0.0–0.7)
Lymphocytes Relative: 5 % — ABNORMAL LOW (ref 12–46)
Monocytes Relative: 2 % — ABNORMAL LOW (ref 3–12)
Neutro Abs: 14.7 10*3/uL — ABNORMAL HIGH (ref 1.7–7.7)
Neutrophils Relative %: 93 % — ABNORMAL HIGH (ref 43–77)

## 2011-01-15 LAB — URINE MICROSCOPIC-ADD ON

## 2011-01-16 DIAGNOSIS — I4891 Unspecified atrial fibrillation: Secondary | ICD-10-CM

## 2011-01-16 LAB — BASIC METABOLIC PANEL
Calcium: 8.1 mg/dL — ABNORMAL LOW (ref 8.4–10.5)
Creatinine, Ser: 0.67 mg/dL (ref 0.50–1.10)
GFR calc Af Amer: >60 mL/min (ref >60)

## 2011-01-16 LAB — CBC
MCH: 28.6 pg (ref 26.0–34.0)
MCV: 87.5 fL (ref 78.0–100.0)
Platelets: 166 10*3/uL (ref 150–400)
RDW: 25 % — ABNORMAL HIGH (ref 11.5–15.5)

## 2011-01-16 LAB — MAGNESIUM: Magnesium: 2 mg/dL (ref 1.5–2.5)

## 2011-01-17 LAB — BASIC METABOLIC PANEL
BUN: 16 mg/dL (ref 6–23)
Calcium: 7.8 mg/dL — ABNORMAL LOW (ref 8.4–10.5)
GFR calc Af Amer: >60 mL/min (ref >60)
GFR calc non Af Amer: >60 mL/min (ref >60)
Potassium: 4.1 mEq/L (ref 3.5–5.1)
Sodium: 128 mEq/L — ABNORMAL LOW (ref 135–145)

## 2011-01-17 LAB — CBC
HCT: 30.8 % — ABNORMAL LOW (ref 36.0–46.0)
MCHC: 32.5 g/dL (ref 30.0–36.0)
RDW: 24.8 % — ABNORMAL HIGH (ref 11.5–15.5)

## 2011-01-17 LAB — URINE CULTURE: Culture  Setup Time: 201206260134

## 2011-01-18 LAB — BASIC METABOLIC PANEL
CO2: 25 mEq/L (ref 19–32)
Calcium: 6.9 mg/dL — ABNORMAL LOW (ref 8.4–10.5)
Chloride: 97 mEq/L (ref 96–112)
Glucose, Bld: 94 mg/dL (ref 70–99)
Sodium: 130 mEq/L — ABNORMAL LOW (ref 135–145)

## 2011-01-19 LAB — OSMOLALITY, URINE: Osmolality, Ur: 472 mOsm/kg (ref 390–1090)

## 2011-01-19 LAB — BASIC METABOLIC PANEL
BUN: 14 mg/dL (ref 6–23)
Chloride: 98 mEq/L (ref 96–112)
GFR calc Af Amer: >60 mL/min (ref >60)
GFR calc non Af Amer: >60 mL/min (ref >60)
Potassium: 3.3 mEq/L — ABNORMAL LOW (ref 3.5–5.1)
Sodium: 131 mEq/L — ABNORMAL LOW (ref 135–145)

## 2011-01-19 LAB — OSMOLALITY: Osmolality: 266 mOsm/kg — ABNORMAL LOW (ref 275–300)

## 2011-01-20 LAB — BASIC METABOLIC PANEL
CO2: 20 mEq/L (ref 19–32)
Chloride: 99 mEq/L (ref 96–112)
GFR calc Af Amer: >60 mL/min (ref >60)
Potassium: 3.2 mEq/L — ABNORMAL LOW (ref 3.5–5.1)
Sodium: 131 mEq/L — ABNORMAL LOW (ref 135–145)

## 2011-01-21 ENCOUNTER — Inpatient Hospital Stay (HOSPITAL_COMMUNITY): Payer: Medicare Other

## 2011-01-21 LAB — CORTISOL-AM, BLOOD: Cortisol - AM: 25.1 ug/dL — ABNORMAL HIGH (ref 4.3–22.4)

## 2011-01-21 LAB — BASIC METABOLIC PANEL
CO2: 20 mEq/L (ref 19–32)
Glucose, Bld: 89 mg/dL (ref 70–99)
Potassium: 4 mEq/L (ref 3.5–5.1)
Sodium: 133 mEq/L — ABNORMAL LOW (ref 135–145)

## 2011-01-22 NOTE — Consult Note (Signed)
[name], [name] NO.:  [id number]  MEDICAL RECORD NO.:  [id number]  LOCATION:  2037                         FACILITY:  [facility]  PHYSICIAN:  Rollene Rotunda, MD, FACCDATE OF BIRTH:  Aug 22, 1922  DATE OF CONSULTATION: DATE OF DISCHARGE:                                CONSULTATION   PRIMARY CARE PHYSICIAN:  Theatre stage manager.  REASON FOR CONSULTATION:  Evaluate the patient with atrial fibrillation.  HISTORY OF PRESENT ILLNESS:  The patient is an 77 year old.  I have seen her a couple of times recently predominantly for management of atrial fibrillation.  She has also had hypoxemia.  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  During this hospitalization, she has had problems with fibrillation with a very rapid rates in the 140s. Management was difficult because of low blood pressures.  She is readmitted now with weakness and hypotension.  She was seen in our office and has had Cardizem held.  She was followed for Coumadin and initially from the hospital in our office, her INR was 10.1.  After reversing this with vitamin K and holding at, she was then subtherapeutic when last seen.  She actually is unclear of her medications.  I think when I have talked to her in the past and currently that she is confused, but she seems to cover well for this. She was brought to the hospital because of weakness.  Apparently, there was a presyncopal episode.  No frank syncope.  She has been noted in the hospital to be hypotensive.  She has had atrial fibrillation.  When she left the hospital last time, she was in sinus rhythm after a TEE-guided cardioversion.  She is not sure when she might have converted back.  She does not really notice palpitations.  She has had no chest pressure, neck or arm discomfort.  She does have some dyspnea with exertion, but she is not describing any PND or orthopnea.  She does not report weight gain or edema.  PAST MEDICAL HISTORY:   Atrial fibrillation, recent diagnosis, interstitial lung disease, microcytic anemia, thrombocytopenia, pneumonia, diastolic heart failure, CML, Sweet's syndrome.  ALLERGIES/INTOLERANCES:  None.  MEDICATIONS: 1. Multivitamin. 2. Digoxin 0.125 mg daily (the patient seems to have been unclear     about other medications, but was on Cardizem and Coumadin at     discharge previously).  SOCIAL HISTORY:  The patient is single.  She was living by herself, but now lives with her sister.  She is a retired Runner, broadcasting/film/video.  She does not smoke cigarettes.  FAMILY HISTORY:  Contributory for early coronary artery disease in her mother.  REVIEW OF SYSTEMS:  As stated in the HPI and positive for unsteady gait. Negative for all other systems.  PHYSICAL EXAMINATION:  GENERAL:  The patient is pleasant and in no distress though I think she has some confusion. VITAL SIGNS:  Blood pressure 89/57, heart rate 101 and irregular, respiratory rate 16, 92% saturation on room air, afebrile. HEENT:  Eyelids are unremarkable, pupils equal, round and reactive to light, fundi not visualized, oral mucosa unremarkable. NECK:  No jugular distention at 45 degrees, carotid upstroke brisk and symmetric, no bruits, no thyromegaly.  Lymphatics no cervical, axillary, inguinal. LUNGS:  Clear to auscultation bilaterally. BACK:  No costovertebral tenderness. CHEST:  Unremarkable. HEART:  PMI not displaced or sustained, S1, S2 within normal.  No S3, no clicks, no rubs, no murmurs. ABDOMEN:  Flat, positive bowel sounds, normal frequency, pitch, no bruits, no rebound or guarding.  No midline pulsatile mass, hepatomegaly, splenomegaly. SKIN:  No rashes, no masses. EXTREMITIES:  2+ pulses, no edema, no cyanosis or clubbing. NEURO:  She is oriented to person and place and time, but I still think she has some mild confusion, cranial nerves grossly intact, motor grossly intact.  LABS:  WBC 15.1, hemoglobin 10.3, platelets 166.  BNP  101, sodium 131, potassium 4.2, BUN 22, creatinine 0.7, TSH 2.194.  ASSESSMENT AND PLAN: 1. Atrial fibrillation.  The patient has recurrent atrial     fibrillation.  However, throughout this hospitalization, a rate     actually has been reasonably controlled which I would consider     anything less than 110 beats per minute.  This has been controlled     despite the fact that we have been holding her diltiazem.  Her dig     level has been therapeutic.  At this point, I would suggest that     she is not a candidate for further cardioversion as she has been     off her Coumadin.  I am not clear that she can take Coumadin.     Given her advanced age and comorbidities, I would not start with     Pradaxa or rivaroxaban.  I think it best be served to have her just     on aspirin.  For rate control perhaps we can get away with digoxin     only.  She is not really tolerating the Cardizem because of her     blood pressure.  I think we should try to avoid more involved     therapies such as AV node ablation and a pacemaker.  I am not going     to start amiodarone given her lung disease.  Our options are     somewhat limited. 2. Diastolic heart failure.  Her pro-BNP was quite low.  She is     actually anything volume depleted and currently is not on diuretic.     I wonder about her p.o. intake at home.  Home health nursing to     evaluate her might be reasonable if this is not in place.     Rollene Rotunda, MD, [facility]     JH/MEDQ  D:  01/16/2011  T:  01/16/2011  Job:  [id number]  Electronically Signed by Rollene Rotunda MD [facility] on 01/22/2011 02:29:33 PM

## 2011-01-23 NOTE — Group Therapy Note (Signed)
[name], [name] NO.:  [id number]  MEDICAL RECORD NO.:  [id number]  LOCATION:  4729                         FACILITY:  [facility]  PHYSICIAN:  Marcellus Scott, MD     DATE OF BIRTH:  10-15-1922                                PROGRESS NOTE   DATE OF DISCHARGE: To be determined.  PRIMARY CARE PHYSICIAN: Dibas Koirala, MD  CARDIOLOGIST: Rollene Rotunda, MD, [facility]  This is a progress note that outlines inpatient care from Dec 19, 2010, to date.  For events prior to Dec 19, 2010, please refer to the progress note dictated by Dr. Marinda Elk on Dec 19, 2010.  CURRENT DIAGNOSES: 1. Atrial fibrillation with rapid ventricular rate, status post TEE-     guided cardioversion on December 26, 2010.  Anticoagulated with     Coumadin. 2. 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     Rule out underlying malignancy. 3. Toxic metabolic encephalopathy, question secondary to steroids. 4. Iron-deficiency anemia, status post IV iron. 5. Thrombocytosis, possibly from iron-deficiency anemia. 6. Interstitial lung disease. 7. Acute on chronic diastolic congestive heart failure.  Lasix     currently held. 8. Marked leukocytosis secondary to Sweet's syndrome. 9. An episode of relative hypotension and presyncope, resolved. 10.Interstitial lung disease.  DISCHARGE MEDICATIONS: To be dictated by discharging MD.  PROCEDURES: TEE-guided cardioversion done today by Dr. Bevelyn Buckles. Bensimhon.  IMAGING STUDIES: 1. CT of the chest without contrast on Dec 17, 2010, impression,     a.     Chronic interstitial lung disease.     b.     Old compression fracture of T6. 2. Chest x-ray on Dec 14, 2010, impression, interval improvement in     congestive heart failure.  Probable tiny residual bilateral pleural     effusions. 3. A 2-D echocardiogram on Dec 10, 2010, left ventricular ejection     fraction was 60-65%.  Trivial pericardial effusion. 4. Transesophageal  echocardiogram showed normal LV function with no     evidence of left atrial appendage clot.  PERTINENT LABS: 1. Punch biopsy of the skin, impression is neutrophilic dermatosis. 2. Blood test shows hemoglobin 9.6, hematocrit 31, white blood cell     49,000 with 86% neutrophils, and platelets of 518.  INR is 2.76.     Comprehensive metabolic panel on December 25, 2010, was only significant     for BUN 24, AST 38, and albumin of 2.6.  ProBNP on December 22, 2010,     was 1518.  Blood cultures x2 from Dec 14, 2010, were no growth,     final report.  JAK2 genotype was not detected.  ANA was negative.     Iron was 27, total iron binding capacity 173.  Rheumatoid factor     was 59.  ESR 90.  CONSULTATIONS: 1. Hematology/Oncology, Dr. Josph Macho. 2. Dermatology, Dr. Bufford Buttner. 3. Cardiology, Dr. Rollene Rotunda. 4. Interventional Radiology.  COMPLAINTS TODAY: The patient is status post TEE-guided cardioversion and under the effects of sedation.  She is somnolent, arousable with briefly opens her eyes, mumbles and drifts back to sleep.  Apparently she was confused this morning.  PHYSICAL EXAMINATION: GENERAL:  The patient is in no obvious distress. VITAL SIGNS:  Temperature 97.4 degrees Fahrenheit, pulse 81 per minute, respirations 18 per minute, blood pressure 109/70 mmHg, and saturating at 92% on room air. RESPIRATORY:  Poor effort, but clear to auscultation and no increased work of breathing. CARDIOVASCULAR:  First and second heart sounds heard, regular.  No JVD. ABDOMEN:  Nondistended, nontender, soft and bowel sounds present. CENTRAL NERVOUS SYSTEM:  The patient is currently sedated, but no focal neurological deficits. SKIN:  Significantly improved skin rash with almost resolution of her skin rash on the back and the face and much reduced rash on the chest and the abdomen.  She still has a few spots of rashes on her lower and upper extremities.  HOSPITAL COURSE SINCE Dec 19, 2010: 1. Atrial fibrillation with rapid ventricular rate.  Cardiology     continued to follow the patient in the hospital.  They had     difficulty controlling her heart rate.  She was anticoagulated.     After careful consideration, they performed a TEE-guided     cardioversion today and the patient will need to continue on her     Coumadin.  Further management as outlined by the cardiologist. 2. Marked leukocytosis and skin rash.  Initially, it was thought to be     secondary to allergic drug reaction.  Dermatology were consulted.     They performed a punch skin biopsy.  She was started on prednisone     of 0.5 mg/kg per day.  However, the skin rash actually continued to     get worse.  Finally, her biopsy report returned yesterday which was     suggestive of neutrophilic dermatosis or Sweet's syndrome.     Hematology/Oncology have also been consulted.  They have reviewed     this report and are working her up for a myeloproliferative     disorder.  The patient at this time is scheduled to have a bone     marrow biopsy tomorrow by the interventional radiologist.     According to the Dermatology followup, the patient is to be on     prednisone at 0.5 to 1 mg/kg and may be require for approximately 4     weeks pending response.  This skin rash seems to be significantly     better compared to yesterday and of note, her prednisone dose was     doubled from 30-60 mg daily yesterday. 3. Toxic metabolic encephalopathy.  This may be secondary to her high-     dose steroids.  There are no focal neurological deficits and she     has no obvious focus of sepsis at this time.  We will need to     monitor her carefully and reevaluate her when she is awake and     alert. 4. Iron-deficiency anemia and thrombocytosis possibly secondary to the     iron-deficiency anemia.  The patient has received a dose of IV iron     and the platelet count may take 3 weeks to improve. 5. Acute on chronic diastolic  congestive heart failure.  The patient     clinically seems to be doing well without the Lasix, which is     currently held.  DISPOSITION: The patient is not ready yet for discharge.     Marcellus Scott, MD     AH/MEDQ  D:  12/26/2010  T:  12/26/2010  Job:  [id number]  Electronically Signed  by Marcellus Scott MD on 01/23/2011 08:22:27 AM

## 2011-01-25 DIAGNOSIS — F329 Major depressive disorder, single episode, unspecified: Secondary | ICD-10-CM

## 2011-01-25 LAB — BASIC METABOLIC PANEL
BUN: 13 mg/dL (ref 6–23)
CO2: 26 mEq/L (ref 19–32)
Chloride: 101 mEq/L (ref 96–112)
Creatinine, Ser: 0.88 mg/dL (ref 0.50–1.10)
GFR calc Af Amer: >60 mL/min (ref >60)
Potassium: 3.6 mEq/L (ref 3.5–5.1)

## 2011-01-25 LAB — CBC
HCT: 28.5 % — ABNORMAL LOW (ref 36.0–46.0)
MCV: 86.9 fL (ref 78.0–100.0)
Platelets: 393 10*3/uL (ref 150–400)
RBC: 3.28 MIL/uL — ABNORMAL LOW (ref 3.87–5.11)
RDW: 23.6 % — ABNORMAL HIGH (ref 11.5–15.5)
WBC: 7 10*3/uL (ref 4.0–10.5)

## 2011-01-25 NOTE — H&P (Signed)
[name], [name] NO.:  [id number]  MEDICAL RECORD NO.:  [id number]  LOCATION:  [facility]                         FACILITY:  [facility]  PHYSICIAN:  Benson Setting, MD    DATE OF BIRTH:  [birth year]  DATE OF ADMISSION:  01/15/2011 DATE OF DISCHARGE:                             HISTORY & PHYSICAL   PRIMARY CARE PHYSICIAN:  [facility].  REASON FOR HOSPITAL ADMISSION:  Generalized weakness.  HISTORY OF PRESENT ILLNESS:  This is a 77 year old pleasant weak appearing elderly Caucasian female with known past medical history of: 1. Recent admission for pneumonia and atrial fibrillation. 2. History of atrial fibrillation. 3. History of interstitial lung disease. 4. History of Sweet syndrome. 5. History of iron deficiency anemia. 6. History of chronic diastolic CHF.  Last known EF of 60% few months     ago. 7. No major surgeries.  This is a 77 year old female who has been recently admitted twice in the last month and a half to this hospital initially for pneumonia and then for AFib with RVR.  The patient appears to have mild memory issues and is unable to recall the exact details of the admission, her medical history, or her home medications.  However, through chart review, it appears that the patient was recently cardioverted by Dr. Gala Romney for atrial fibrillation with RVR.  Thereafter, she was discharged on digoxin, Cardizem, and Coumadin, however, the patient does not recall taking these medications.  According to the patient who has been recently living with her sister, phone number [phone] (no response upon calling went to answering machine).  She states that this morning when she woke up, she was feeling generally weak all over.  She attempted to get to the breakfast table where she got lightheaded and had the sensation of passing out.  She thereafter sat down, she denies actually any events where she passed out, no falls, no head injuries.  She  denies any recent headache, nausea, vomiting.  Denies any recent fever or chills.  Denies any recent cough, phlegm, or shortness of breath. Denies any sensation of palpitations.  Despite recent antibiotic treatment, denies any history of diarrhea.  She does say that she might have some mild dysuria.  Denies any focal weakness, tingling, numbness in any extremity per se.  Denies noticing any blood in stool or urine. She does not recall taking Coumadin, although her recent discharge summary from 2 weeks ago shows that she was discharged on Coumadin 5 mg p.o. at bedtime.  She is unsure of what medications she is taking.  Upon arriving to the ER, the patient was found to be hypotensive.  Her blood pressure was 85/60.  She was in AFib with RVR with heart rate between 100 and 110.  Her initial EKG showed AFib with RVR with no acute ST-T wave changes.  Her chest x-ray was unremarkable.  Her UA appeared unremarkable too.  On her basic blood work except for mild leukocytosis of 15.8, there were no frank abnormalities.  Her digoxin level was 0.8. Her troponin was 0.30.  I was called for admission for atrial fibrillation with RVR and generalized weakness.  PAST MEDICAL AND SURGICAL:  As  above.  REVIEW OF SYSTEMS:  Full 10-point review of systems was obtained except as dictated in the HPI.  All other review of systems are negative.  FAMILY HISTORY:  Mother had coronary artery disease at the age of 30. Father had coronary artery disease at the age of 55.  SOCIAL HISTORY:  She has no children.  She is a retired Runner, broadcasting/film/video.  No history of alcohol or drug use, currently living with her sister.  HOME MEDICATIONS:  Unclear, however, last discharge summary June 19 shows that she is on: 1. Cardizem CD 240 mg p.o. daily. 2. Prednisone 50 mg p.o. daily. 3. Hydroxyurea 500 mg p.o. daily. 4. Multivitamin 1 pill p.o. daily. 5. Coumadin 5 mg p.o. at bedtime. 6. Digoxin 0.125 mg p.o. daily. 7. Protonix 40  mg p.o. daily.  ALLERGIES:  She denies any known allergies.  PHYSICAL EXAMINATION:  LATEST VITAL SIGNS:  Temperature 98, pulse 118, respirations 14, blood pressure 82/65, pulse ox 94% on room air. GENERAL:  Frail elderly Caucasian female lying in hospital bed in no apparent discomfort. HEENT:  Normocephalic, atraumatic head.  Pupils are equal and reactive to light.  Pink and moist tongue and throat.  External examination of eyes and ears unremarkable.  No scleral icterus.  No conjunctival injection. NECK:  No JVD.  Neck is supple. PSYCH:  Insight is clear.  She is alert, awake, oriented x2. CNS:  All cranial nerves intact.  The patient did get lightheaded upon sitting up in the bed.  No focal neurological deficits. CHEST:  Symmetrical chest wall movement.  Good air movement bilaterally. No rhonchi.  No wheezes. CVS: Irregularly irregular rate and rhythm.  Normal S1, S2.  No gallops or murmurs. ABDOMEN:  Soft, positive bowel sounds, nontender. MUSCULOSKELETAL:  Good muscle tone.  No digital clubbing. SKIN:  No cyanosis or bruises noted. NECK:  No palpable cervical lymph nodes.  LATEST LABS:  White count 15.8, hemoglobin 11.7, hematocrit 35.7, platelets 178,000.  Sodium 133, potassium 3.8, chloride 105.  BUN 27, creatinine 0.8.  Digoxin level 0.8.  Troponin-I 0.30.  UA unremarkable. Chest x-ray, no acute cardiopulmonary process.  EKG, atrial fibrillation with RVR, rate 106 beats per minute, QTC 393 milliseconds, no acute ST-T wave changes.  ASSESSMENT AND PLAN: 1. Generalized weakness secondary to combination of hypotension and     atrial fibrillation with rapid ventricular response.  Plan is to     admit the patient, gently hydrate her with IV fluids.  Hold her     Cardizem for now until her blood pressure is above 110.  Continue     digoxin.  If needed extra IV dose digoxin will be given.  The     patient is not a candidate for amiodarone as she has underlying     interstitial  lung disease.  We will check her TSH.  Cardiology     consult if needed.  Of note, the patient was recently cardioverted     by Dr. Gala Romney few weeks ago. 2. Leukocytosis most likely secondary to patient being on steroid.     Last discharge summary of 2 weeks ago shows that she was discharged     on 50 mg of prednisone most likely secondary to her interstitial     lung disease and history of Sweet syndrome, however, the patient     does not recall her present dose.  For now, I will continue 50 mg     of prednisone 1 pill now.  Her  blood pressures after IV fluid have     improved.  If she remains hypotensive, she will be converted to     stress dose steroids.  However, for now her blood pressure seems to     have stabilized with gentle hydration. 3. History of interstitial lung disease and Sweet syndrome.     Outpatient followup. 4. Mild dysuria.  Urinalysis is unremarkable.  We will monitor for any     fever, chills, etc.  No antibiotics for now. 5. History of atrial fibrillation with rapid ventricular response as     #1 above.  Of note, I do not think the patient is a good candidate     for Coumadin.  She appears to be frail, has early signs of dementia     versus encephalopathy.  Is a high fall risk candidate.  However, we     will defer this to her primary cardiologist. 6. History of chronic diastolic congestive heart failure.  Last known     ejection fraction 60%, currently clinically compensated, gentle     hydration.  Monitor Is and Os.  Monitor for edema, rales, and     dropping pulse ox.  No Lasix for now secondary to hypotension.  The     patient is full code. 7. We will check INR.  If INR is under 1.8, she will get deep venous     thrombosis prophylaxis dose of heparin. 8. Protonix 40 mg p.o. daily will be continued.          ______________________________ Benson Setting, MD     PS/MEDQ  D:  01/15/2011  T:  01/15/2011  Job:  [id number]  Electronically Signed by  Susa Raring MD on 01/25/2011 04:48:46 PM

## 2011-01-25 NOTE — Discharge Summary (Signed)
  [name], [name]               ACCOUNT NO.:  [id number]  MEDICAL RECORD NO.:  [id number]  LOCATION:  4729                         FACILITY:  [facility]  PHYSICIAN:  Bevelyn Buckles. Sohana Austell, MDDATE OF BIRTH:  [birth year]  DATE OF ADMISSION:  12/14/2010 DATE OF DISCHARGE:                        DISCHARGE SUMMARY - REFERRING   Cardioversion report.  [name] is an 77 year old woman who was admitted with pneumonia and had atrial fibrillation with rapid ventricular response, which was quite symptomatic.  She was referred for TEE-guided cardioversion.  Her INR was therapeutic greater than 2.  TEE showed normal LV function with no evidence of left atrial appendage clot.  She underwent further sedation by Dr. Jacklynn Bue, anesthesia, with 15 mg of IV propofol and once appropriately sedated she received 75-joule biphasic synchronized shock delivered through AP pads with prompt conversion to sinus rhythm.  There are no apparent complications.     Bevelyn Buckles. Summers Buendia, MD     DRB/MEDQ  D:  12/26/2010  T:  12/26/2010  Job:  [id number]  Electronically Signed by Arvilla Meres MD on 01/25/2011 03:23:43 PM

## 2011-01-26 NOTE — Discharge Summary (Signed)
[name], [name] NO.:  [id number]  MEDICAL RECORD NO.:  [id number]  LOCATION:                                 FACILITY:  PHYSICIAN:  Rosanna Randy, MDDATE OF BIRTH:  Mar 15, 1923  DATE OF ADMISSION: DATE OF DISCHARGE:                              DISCHARGE SUMMARY   ADDENDUM  Please refer to discharge summary, job number [id number], for further details.  This update is in order to provide date of discharge, updated discharge medications, and also disposition and followup for this patient.  DISCHARGE DIAGNOSES: 1. Atrial fibrillation with rapid ventricular response. 2. Hyponatremia. 3. Orthostatic hypotension. 4. Chronic myelocytic leukemia. 5. Interstitial lung disease. 6. Gastroesophageal reflux disease. 7. Anemia, iron deficiency. 8. Mild dementia. 9. Chronic diastolic congestive heart failure. 10.Allergy rhinitis.  DISCHARGE MEDICATIONS: 1. Amiodarone 200 mg every 8 hours. 2. Aspirin 81 mg by mouth daily. 3. Midodrine 2.5 mg at 7 a.m., 11 a.m., and 3:00 p.m. 4. Protonix 40 mg by mouth daily. 5. Digoxin 1.25 mg to take half tablet by mouth daily. 6. Multivitamin 1 tablet by mouth daily. 7. Hydroxyurea 500 mg 1 tablet by mouth daily. 8. Fluticasone 50 mcg nasal spray, one spray nasally twice a day.  DISPOSITION AND FOLLOWUP:  The patient had been discharged in a stable and improved condition, still with some weakness and physical deconditioning, but physical therapy and occupational therapy have been arranged to continue working with the patient at home.  She had been instructed to arrange a followup appointment with the patient's primary care physician, Dr. Docia Chuck, in 7 days and also to arrange followup with Dr. Antoine Poche, cardiologist, in 2-3 weeks.  She will also arrange followup with Dr. Arline Asp, who is the patient's Hematology/Oncology, in order to review her CML and anemia in about 3 weeks.  The patient had been instructed to be  compliant with her medications and to follow a low sodium heart-healthy diet.  PROCEDURES PERFORMED DURING THIS ADMISSION:  The patient has a chest x- ray done on January 15, 2011, that demonstrated no acute cardiopulmonary process with a followup x-ray two views on January 21, 2011, that demonstrated increased patchy opacities at both lung bases superimposed on chronic interstitial lung disease that might reflect atelectasis.  No other procedures were performed during this admission.  Psychiatry and Cardiology were consulted during this hospitalization, please refer to their consultations now for details and recommendations.  BRIEF HISTORY OF PRESENT ILLNESS:  The patient is an 77 year old female with a history of atrial fibrillation, interstitial lung disease, and chronic myelocytic leukemia/myeloproliferative disease/myeloproliferative disorder.  The patient came into the hospital because of generalized weakness.  The patient was admitted twice in the last month because of pneumonia with atrial fibrillation and rapid ventricular response.  The patient appears to have mild memory issue and unable to recall the exact details of those admissions, her medical history, her home medications, however, through the chart review, it appears that the patient recently was cardioverted by Dr. Gala Romney for atrial fibrillation with RVR.  Thereafter, she was discharged on digoxin, Cardizem, and Coumadin; however, the patient does not recall taking this medications.  The patient was brought to the hospital  by her sister stating that the morning that she woke up she was feeling really weak.  She attempted to get breakfast at the table but she got lightheaded and had the sensation of passing out and thereafter sat down.  She denies actually any event of passing out or having any falls. There are no head injuries.  The patient was admitted to the hospital for further evaluation.  HOSPITAL COURSE BY  PROBLEM: 1. The patient's atrial fibrillation with RVR.  The patient was     admitted and was found to be asymptomatic with no palpitations or     chest pain but she had positive orthostatic hypotension.  The     patient's medication was switched to amiodarone per Cardiology     recommendation and was instructed to continue digoxin with a new     adjusted dose.  The Cardizem was discontinued secondary to the     orthostasis.  The patient had been doing a lot much better with     heart rate from 81-100.  She is currently not symptomatic and     because of her hypotension was started on midodrine.  The plan is     for her to follow up with Dr. Antoine Poche in 2-3 weeks for further     adjustment of her medications.  At this point, she is not a     candidate for cardioversion and the plan is to put her on aspirin.     She does not qualify for Coumadin due to her age, comorbidities. 2. Chronic hyponatremia.  At discharge, the patient's sodium was in     the normal range.  Cortisol level and TSH are also normal.  The     hyponatremia was treated with normal saline and slowly was     repleted. 3. Chronic myelocytic leukemia.  She is going to continue using     hydroxyurea and is going to follow up with Dr. Arline Asp for further     followup and treatment. 4. Hypotension.  She received fluid resuscitation gently due to the     history of congestive heart failure, diastolic, and was started on     midodrine in order to help with the blood pressure.  She is going     to take this medication three times a day and is going to follow up     with primary care physician and also with the patient's     cardiologist as an outpatient. 5. Interstitial lung disease.  This was a recent diagnosis made upon     radiological studies.  She is going to follow up with Dr. Shan Levans as an outpatient.  She is currently breathing okay and not     having any troubles or concerns. 6. Gastroesophageal reflux  disease.  We are going to continue     Protonix. 7. Anemia, iron deficiency.  She is going to follow up with Dr.     Arline Asp, Hematology/Oncology, for her [facility] and at that moment, it     will be important to recommend any iron by mouth or IV if needed. 8. Mild dementia.  At this point, no treatment is required.  The     patient will continue following up with primary care physician for     further medications/treatment recommendation about this issue.  At     discharge, the patient's mental status and capacity were pretty     much at baseline.  She was evaluated by Psychiatry, and they have     recommended that she is okay to take her own decisions, specially     with support of her sister.  At this moment, the patient is going     to be discharged home with home health PT and OT and is going to     continue receiving 24 hours care by her sister.  PHYSICAL EXAMINATION AT DISCHARGE:  VITAL SIGNS:  Temperature 98.3, blood pressure 97/64, heart rate 102, respiratory rate 18, oxygen saturation 95% on room air. GENERAL:  The patient was in no acute distress.  She is alert, awake, and oriented x3, not having any acute complaints. RESPIRATORY:  Clear to auscultation. HEART:  Irregular with normal S1 and S2 but her rate was controlled at 96 on physical exam. ABDOMEN:  Soft, nontender, nondistended with positive bowel sounds. EXTREMITIES:  Without any edema. NEUROLOGIC:  Nonfocal.  Mini-mental status exam demonstrated a value of 24/30 which categorize her for mild dementia.     Rosanna Randy, MD     CEM/MEDQ  D:  01/25/2011  T:  01/26/2011  Job:  [id number]  cc:   Rollene Rotunda, MD, [facility] [place] Docia Chuck, MD  Electronically Signed by Vassie Loll MD on 01/26/2011 02:08:16 PM

## 2011-02-02 ENCOUNTER — Inpatient Hospital Stay: Payer: Medicare Other | Admitting: Critical Care Medicine

## 2011-02-14 NOTE — Discharge Summary (Signed)
Paula, [name]               ACCOUNT NO.:  [id number]  MEDICAL RECORD NO.:  [id number]  LOCATION:  2037                         FACILITY:  [facility]  PHYSICIAN:  Clydia Llano, MD       DATE OF BIRTH:  1922-12-26  DATE OF ADMISSION:  01/15/2011 DATE OF DISCHARGE:                        DISCHARGE SUMMARY - REFERRING   PRIMARY CARE PHYSICIAN:  Dr. Darrow Bussing.  CARDIOLOGIST:  Dr. Rollene Rotunda.  REASON FOR ADMISSION:  Generalized weakness.  DISCHARGE DIAGNOSES: 1. Atrial fibrillation. 2. Hyponatremia. 3. Orthostatic hypotension. 4. Interstitial lung disease. 5. Chronic myelocytic leukemia diagnosed on bone marrow biopsy. 6. Iron deficiency anemia. 7. Chronic diastolic congestive heart failure. 8. Neutrophilic dermatosis. 9. 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  DISCHARGE MEDICATIONS: 1. Amiodarone 200 mg every 8 hours. 2. Aspirin 81 mg p.o. daily. 3. Midodrine 2.5 mg at 7 a.m., 11 a.m. and 1500 hours. 4. Protonix 40 mg p.o. daily. 5. Digoxin 1.25 mg, take half tablet p.o. daily. 6. Multivitamin p.o. daily. 7. Hydroxyurea 500 mg p.o. daily.  BRIEF HISTORY:  [name] is an 77 year old female with history of atrial fibrillation, interstitial lung disease, and chronic myelocytic leukemia/myeloproliferative disease/myeloproliferative disorder.  The patient came into the hospital because of weakness.  The patient was admitted twice in the last months and to the hospital because of pneumonia with atrial fibrillation with rapid ventricular response.  The patient appears to have mild memory issues and unable to recall the exact details of admission, her medical history, her home medication. However, through the chart reviewed, it appears that the patient recently cardioverted by Dr. Gala Romney for atrial fibrillation with RVR. Thereafter, she was discharged on digoxin, Cardizem and Coumadin. However, the patient does not recall taking these medications.   The patient was brought to the hospital by her sister.  She stated that morning when she woke up, she was feeling generally weak.  She attempted to get breakfast table where she got lightheaded and had sensation of passing out and thereafter sat down.  She denies actually any events or pass out.  No falls.  No head injuries.  The patient admitted to the hospital for further evaluation.  PROCEDURE:  Recent transesophageal echocardiogram showed ejection fraction of 63%.  RADIOLOGY:  Chest x-ray July 01 showed increased patchy opacity of the lung disease in the bases with superimposed and chronic interstitial lung disease.  Finding may reflect atelectasis or developing pneumonia. No acute cardiopulmonary process.  BRIEF HOSPITAL STAY: 1. Atrial fibrillation with rapid ventricular response.  The patient     was admitted to the hospital.  She was asymptomatic with no     palpitation or chest pain.  But, she did have orthostatic     hypotension.  The patient's medication was switched to amiodarone     and the digoxin dose was adjusted.  The Cardizem was discontinued.     The patient was doing better with that with heart rate from 80-120     but the patient is still is asymptomatic.  Cardiology was during     the medication changes. 2. Chronic hyponatremia.  The patient has chronic hyponatremia which     has been improved.  Her  cortisol level is normal as well as TSH.     This was treated with IV normal saline and sodium level is 133 at     the time of discharge.  The patient has been in the low side since     May 2012.  But, it has been better in the beginning of June in the     [facility]. 3. Chronic myelocytic leukemia.  The patient did have leukocytosis for     some time last time she was in the hospital.  Bone marrow biopsy     was obtained and now was seen by Hem/Onc.  The patient was put on     hydroxyurea 500 mg p.o. daily and prednisone.  The patient seems     like she  is not taking her medications at home and have shown some     memory issues.  I did restart the Hydrea and I did not restart the     prednisone.  The patient needs follow-up with her oncologist, Dr.     Myna Hidalgo, for further evaluation. 4. Hypotension.  The hypotension seems to be orthostatic.  The patient     is started on midodrine and it seems to help with the blood     pressure for now.  The dose should be at 0700 hours, 1100 hours,     and 1500 hours.  The patient was seen by PT and OT and they both     recommended SNF because of her functional status. 5. Dementia with paranoid features.  The patient has probably dementia     with paranoid features.  The patient cannot remember to have short-     term memory issues.  She needs probably further evaluation as     outpatient. 6. Interstitial lung disease.  This was diagnosed recently upon     radiological study.  She is following with Dr. Shan Levans.  The     patient on room air having no issues with breathing.  DISCHARGE INSTRUCTIONS: 1. Activity as tolerated and indicated by PT, OT. 2. Disposition is skilled nursing facility. 3. Diet, regular as tolerated.     Clydia Llano, MD     ME/MEDQ  D:  01/23/2011  T:  01/23/2011  Job:  [id number]  cc:   Darrow Bussing, MD  Electronically Signed by Clydia Llano  on 02/14/2011 07:59:11 PM

## 2011-08-07 ENCOUNTER — Ambulatory Visit: Payer: Self-pay | Admitting: Pharmacist

## 2011-08-07 DIAGNOSIS — Z7901 Long term (current) use of anticoagulants: Secondary | ICD-10-CM

## 2011-08-07 DIAGNOSIS — IMO0002 Reserved for concepts with insufficient information to code with codable children: Secondary | ICD-10-CM

## 2011-10-03 ENCOUNTER — Telehealth: Payer: Self-pay | Admitting: Cardiology

## 2011-10-03 NOTE — Telephone Encounter (Signed)
New Msg: pt calling wanting to speak with nurse/MD about pt needing a PCP. Pt would like Dr. Antoine Poche to recommend a PCP to follow pt. Pt currently c/o sore throat. Please return pt call to discuss further.

## 2011-10-03 NOTE — Telephone Encounter (Signed)
Per pt call - stating she has been having a sore throat and cough for several days now.  She would like to be given the name of a PCP to see.  Recommended pt to call [facility] primary care to schedule an appointment.  Pt does have an appointment scheduled for tomorrow with Dr Sherene Sires.  Pt seemed confused and didn't know about the appointment which her sister had made.  She will be seen there as scheduled

## 2011-10-04 ENCOUNTER — Ambulatory Visit (INDEPENDENT_AMBULATORY_CARE_PROVIDER_SITE_OTHER): Payer: Medicare Other | Admitting: Internal Medicine

## 2011-10-04 ENCOUNTER — Encounter: Payer: Self-pay | Admitting: Internal Medicine

## 2011-10-04 VITALS — BP 144/84 | HR 136 | Temp 97.5°F | Ht 62.0 in | Wt 127.4 lb

## 2011-10-04 DIAGNOSIS — I4891 Unspecified atrial fibrillation: Secondary | ICD-10-CM

## 2011-10-04 DIAGNOSIS — J8410 Pulmonary fibrosis, unspecified: Secondary | ICD-10-CM

## 2011-10-04 DIAGNOSIS — J310 Chronic rhinitis: Secondary | ICD-10-CM

## 2011-10-04 DIAGNOSIS — J849 Interstitial pulmonary disease, unspecified: Secondary | ICD-10-CM

## 2011-10-04 MED ORDER — AMOXICILLIN-POT CLAVULANATE 875-125 MG PO TABS: 1.0000 | ORAL_TABLET | Freq: Two times a day (BID) | ORAL | Status: AC

## 2011-10-04 NOTE — Patient Instructions (Addendum)
Mucinex 600mg   1-2 one to two every 12 hours  Augmentin 875 mg one twice daily(large glass of water to wash it down)  and call [phone] if not better for sinus CT if not better after 10 days   Eat yogurt for lunch   You will need follow up with Dr Myna Hidalgo and Dr Antoine Poche and for sure your primary doctor but no pulmonary follow up is needed

## 2011-10-04 NOTE — Progress Notes (Signed)
  Subjective:    Patient ID: [name], female    DOB: 1922-12-28  MRN: [id number]  HPI  7 yowf never smoked but problems with recurrent sinus problems pretty much every winter admitted with rapid afib in June of 2012   Admit 01/15/12 1. Atrial fibrillation with rapid ventricular response.  2. Hyponatremia.  3. Orthostatic hypotension.  4. Chronic myelocytic leukemia.  5. Interstitial lung disease.  6. Gastroesophageal reflux disease.  7. Anemia, iron deficiency.  8. Mild dementia.  9. Chronic diastolic congestive heart failure.  10.Allergy rhinitis.  DISCHARGE MEDICATIONS:  1. Amiodarone 200 mg every 8 hours.  2. Aspirin 81 mg by mouth daily.  3. Midodrine 2.5 mg at 7 a.m., 11 a.m., and 3:00 p.m.  4. Protonix 40 mg by mouth daily.  5. Digoxin 1.25 mg to take half tablet by mouth daily.  6. Multivitamin 1 tablet by mouth daily.  7. Hydroxyurea 500 mg 1 tablet by mouth daily.  8. Fluticasone 50 mcg nasal spray, one spray nasally twice a day   10/04/2011 1st pulmonary ov cc nasal congestion and cough on no medications worse x 10 days with purulent nasal secrtions but no sob taking aspirin feels getting better on day of ov. Denied sob "who told you that???"  Patient failed to answer a single question asked in a straightforward manner, tending to go off on tangents or answer questions with ambiguous medical terms or diagnoses and seemed aggravated to point of hostility  when asked the same question more than once for clarification. No additional specific hx obtainable    Review of Systems  Constitutional: Negative for fever and unexpected weight change.  HENT: Positive for rhinorrhea and sneezing. Negative for ear pain, nosebleeds, congestion, sore throat, trouble swallowing, dental problem, postnasal drip and sinus pressure.   Eyes: Negative for redness and itching.  Respiratory: Positive for cough. Negative for chest tightness, shortness of breath and wheezing.     Cardiovascular: Negative for palpitations and leg swelling.  Gastrointestinal: Negative for nausea and vomiting.  Genitourinary: Negative for dysuria.  Musculoskeletal: Negative for joint swelling.  Skin: Negative for rash.  Neurological: Negative for headaches.  Hematological: Does not bruise/bleed easily.  Psychiatric/Behavioral: Negative for dysphoric mood. The patient is not nervous/anxious.        Objective:   Physical Exam   Chronically ill pale elderly wf nad Wt  127 10/04/2011  HEENT: nl dentition, turbinates, and orophanx. Nl external ear canals without cough reflex   NECK :  without JVD/Nodes/TM/ nl carotid upstrokes bilaterally   LUNGS: no acc muscle use, clear to A and P bilaterally without cough on insp or exp maneuvers   CV:  IRR no s3 or murmur or increase in P2, no edema - apical pulse 110-120  ABD:  soft and nontender with nl excursion in the supine position. No bruits or organomegaly, bowel sounds nl  MS:  warm without deformities, calf tenderness, cyanosis or clubbing  SKIN: warm and dry without lesions    NEURO:  alert, approp, no deficits   01/21/2011 Increased patchy opacities at both lung bases superimposed on  chronic interstitial lung disease. Findings may reflect  atelectasis or developing pneumonia.      Assessment & Plan:

## 2011-10-05 DIAGNOSIS — J310 Chronic rhinitis: Secondary | ICD-10-CM | POA: Insufficient documentation

## 2011-10-05 NOTE — Assessment & Plan Note (Addendum)
CT scan  of chest 01/21/11 1. Chronic interstitial lung disease.  2. Old compression fracture of T6.   DDx for pulmonary fibrosis  includes idiopathic pulmonary fibrosis, pulmonary fibrosis associated with rheumatologic diseases (which have a relatively benign course in most cases) , adverse effect from  drugs such as chemotherapy or amiodarone exposure, nonspecific interstitial pneumonia which is typically steroid responsive, and chronic hypersensitivity pneumonitis.   I would be very concerned here with any further exposure to chemotherapeutic agents or amiodarone in this lady who doesn't take her medications correctly anyway or get f/u recommended using the rule of "first do no harm"  Spent extra time with her and sister trying to help them understand the various dx's on the chart and physicians of record but got nowhere (no one ever tells [place] anything)  See instructions for specific recommendations which were reviewed directly with the patient who was given a copy with highlighter outlining the key components.

## 2011-10-05 NOTE — Assessment & Plan Note (Signed)
Poorly controlled but not likely to be compliant > f/u cardiology rec in writing

## 2011-10-05 NOTE — Assessment & Plan Note (Signed)
Since she has underlying cml and reported purulent drainage I rec augmentin x 10 days and f/u sinus ct/ent eval if not improving

## 2012-04-02 IMAGING — CT CT CHEST W/O CM
2 of 3 series · 15 of 36 positions shown, 18 images · non-contrast
Comparison: Chest x-ray dated 12/14/2010

CLINICAL DATA: Cough.  Atrial fibrillation.

CT CHEST WITHOUT CONTRAST
TECHNIQUE: Multidetector CT imaging of the chest was performed
following the standard protocol without IV contrast.

[Series 2: routine chest 5.0 st · axial · 0.57mm/px · z∈[-288,-54]mm · 12 of 57 slices shown, 15 images]
[im 5/57  mediastinal]
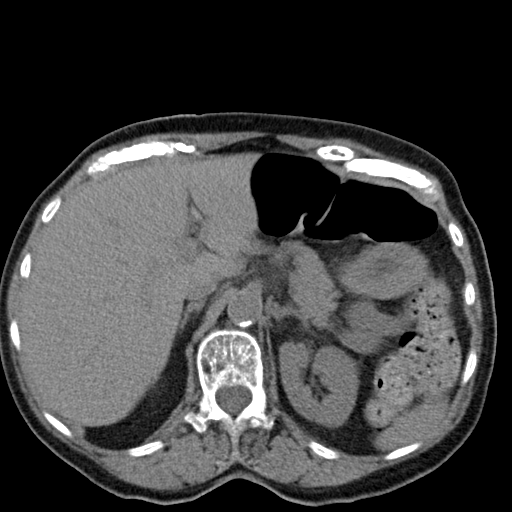
[im 5/57  lung]
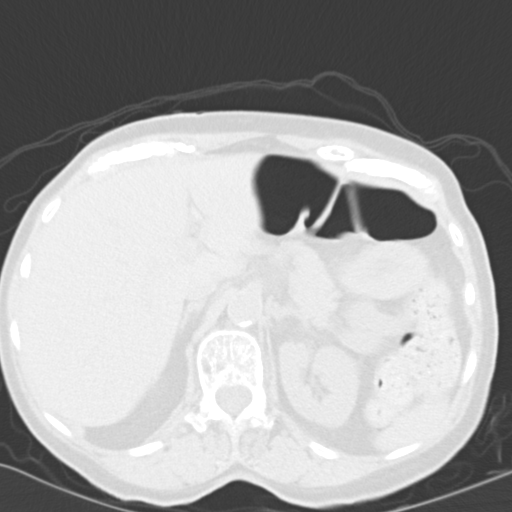
[im 9/57  lung]
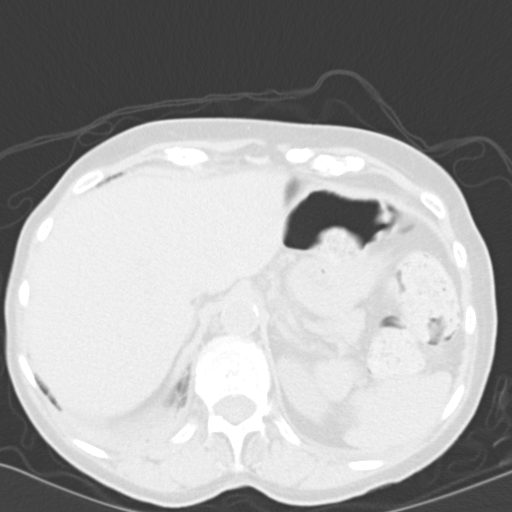
[im 13/57  lung]
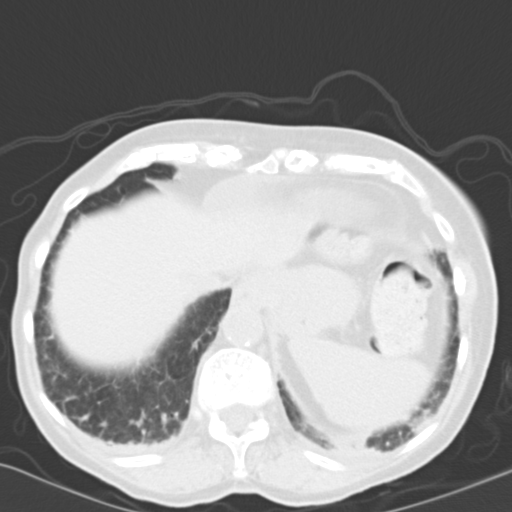
[im 17/57  lung]
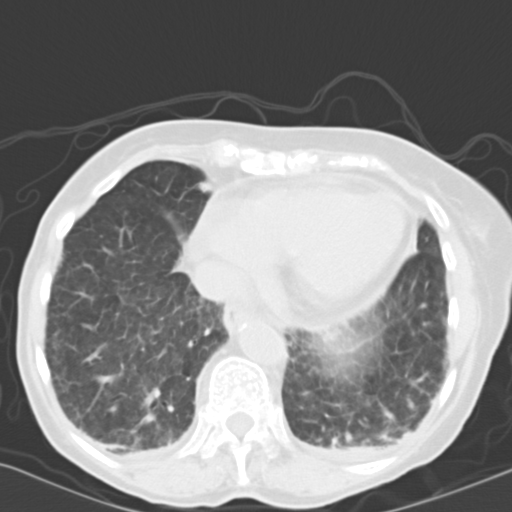
[im 21/57  mediastinal]
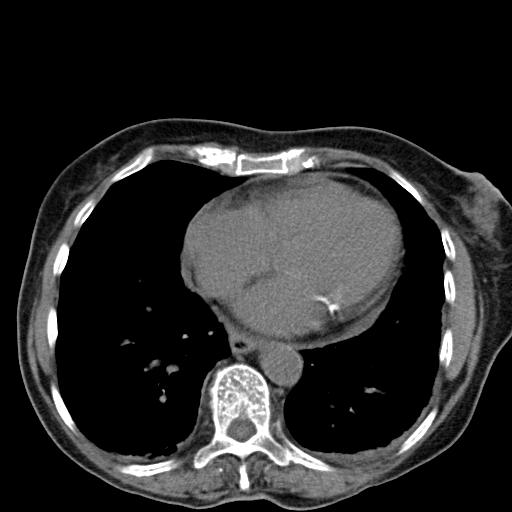
[im 21/57  lung]
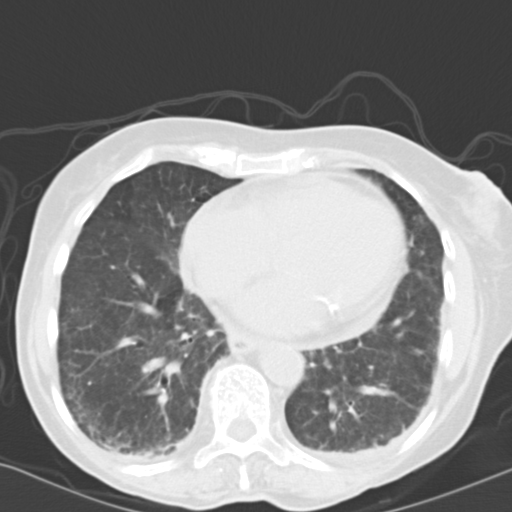
[im 25/57  lung]
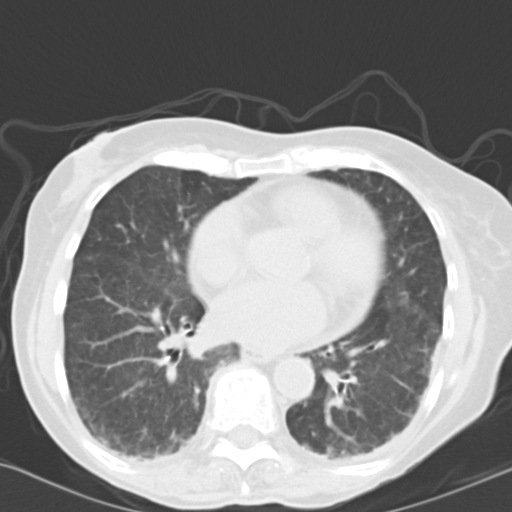
[im 32/57  lung]
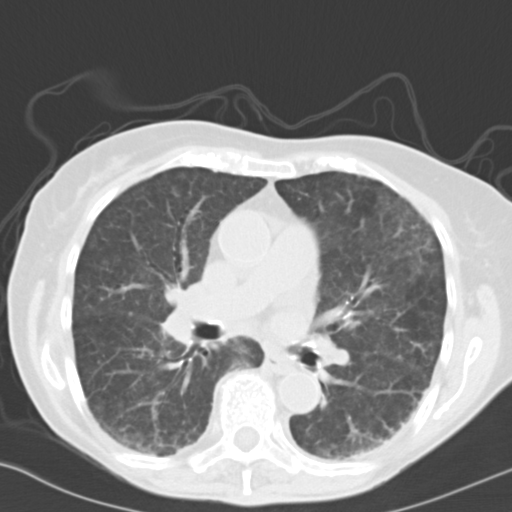
[im 36/57  lung]
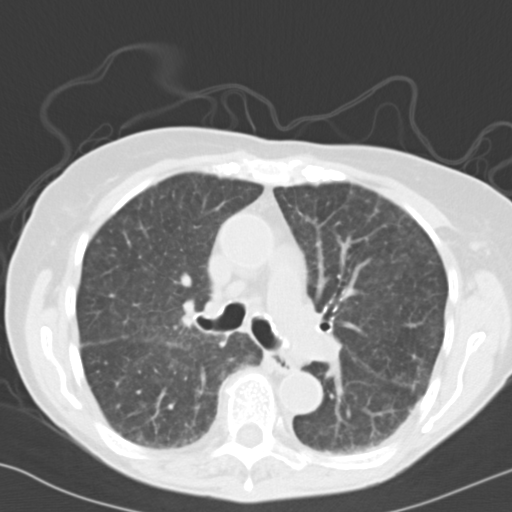
[im 40/57  mediastinal]
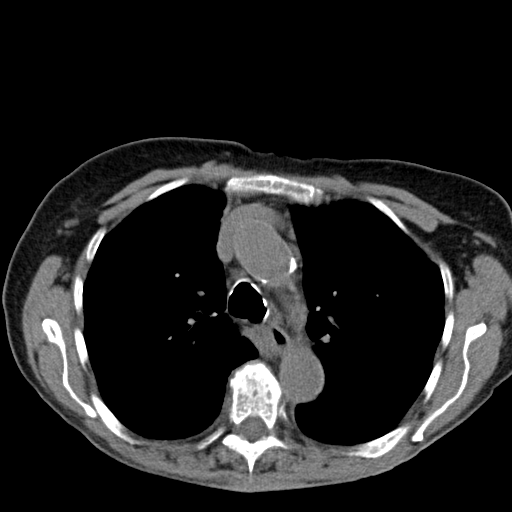
[im 40/57  lung]
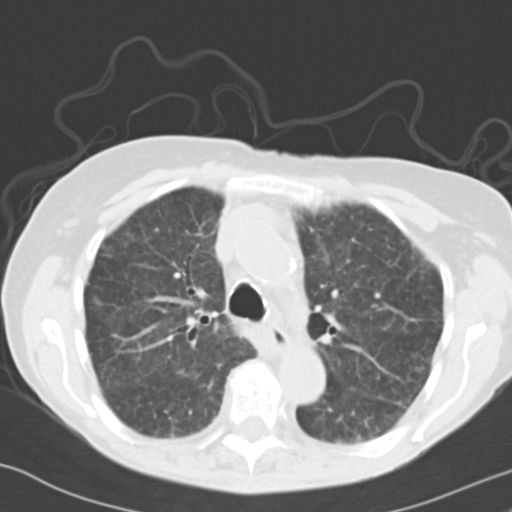
[im 44/57  lung]
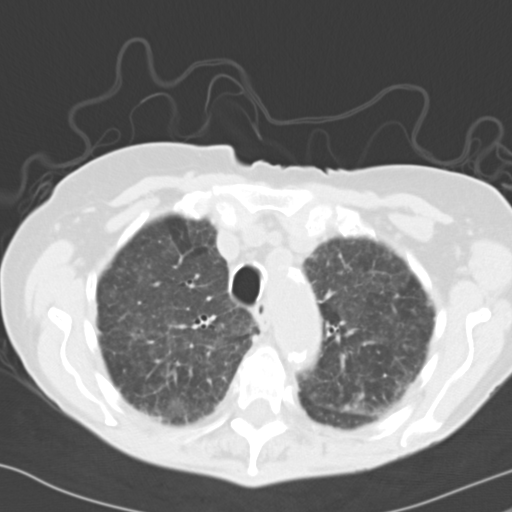
[im 48/57  lung]
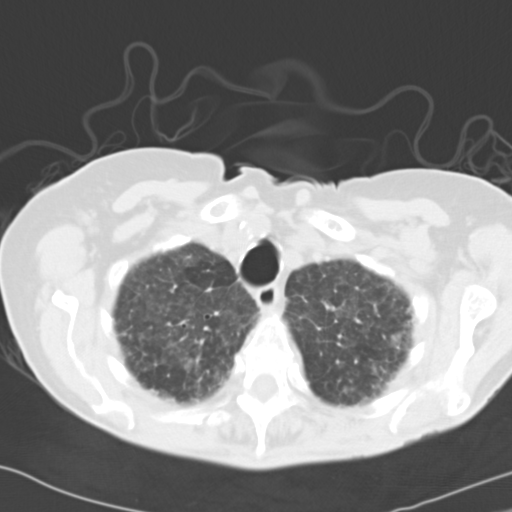
[im 52/57  lung]
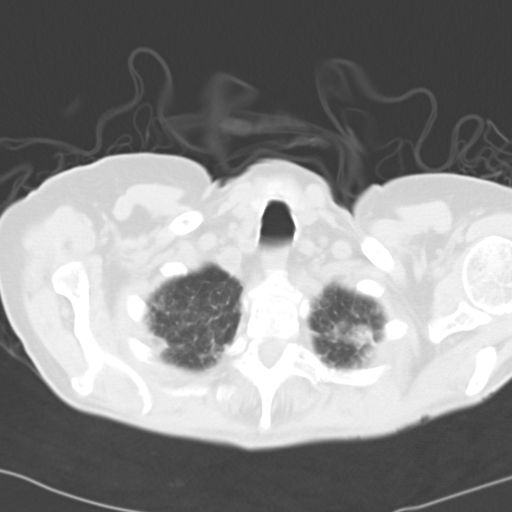

[Series 5: routine chest 2.0 cor · coronal · 0.65mm/px · 3 of 107 slices shown]
[im 22/107  lung]
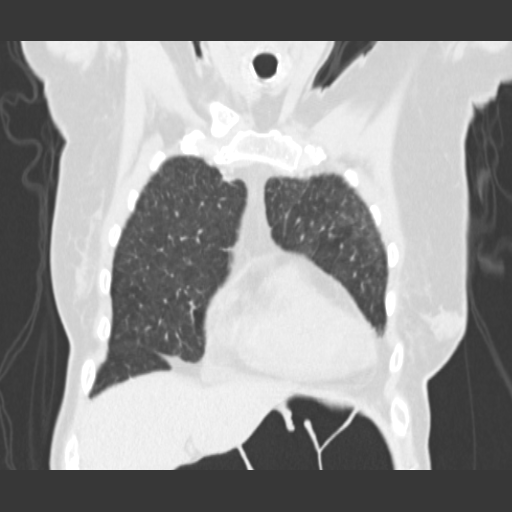
[im 43/107  lung]
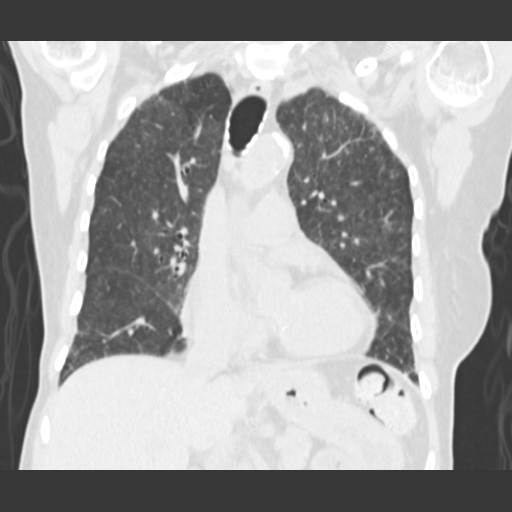
[im 64/107  lung]
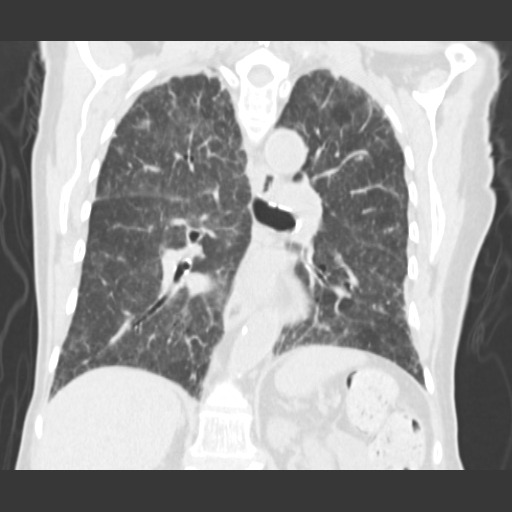

[15 of 36 positions shown; findings below may reference images not displayed]

FINDINGS: Heart size is normal.  There are no effusions.  There is
minimal atelectasis at the lung bases.  There are a few scattered
lymph nodes in the mediastinum, probably reactive.

There is peribronchial thickening diffusely with accentuation of
the interstitial markings consistent with chronic lung disease.
There is a slight compression fracture of T6, unchanged since
12/06/2010.  There is no paravertebral soft tissue prominence or
discrete acute fracture.  I feel this is an old compression
fracture.
IMPRESSION: 1.  Chronic interstitial lung disease.
2.  Old compression fracture of T6.

## 2012-04-12 IMAGING — CT CT BIOPSY
1 series · 15 of 25 positions shown, 19 images · non-contrast
Comparison: none

CLINICAL DATA: Leukocytosis, anemia

[Series 2: bone marrow bx · axial · 0.47mm/px · z∈[-179,-114]mm · 15 of 25 slices shown, 19 images]
[im 2/25  soft-tissue]
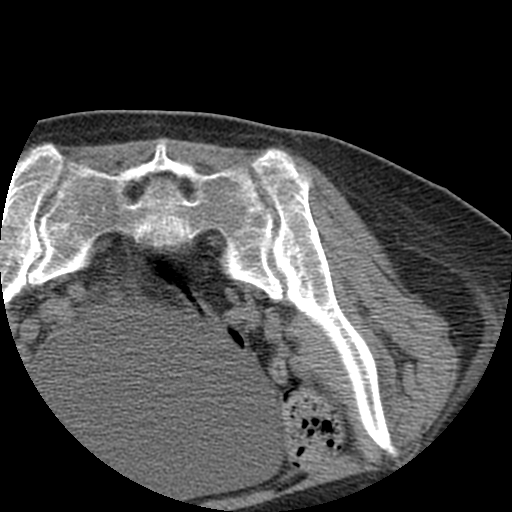
[im 2/25  bone]
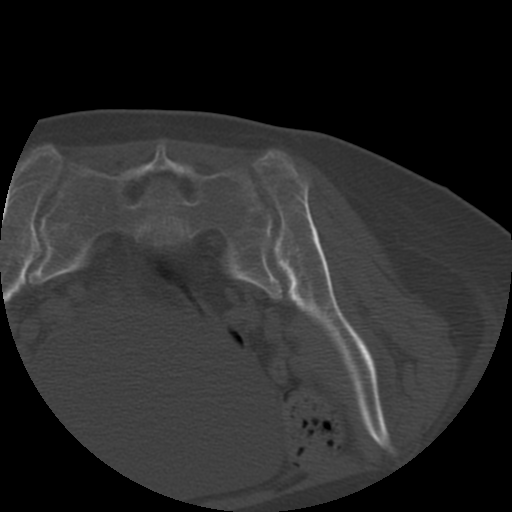
[im 4/25  soft-tissue]
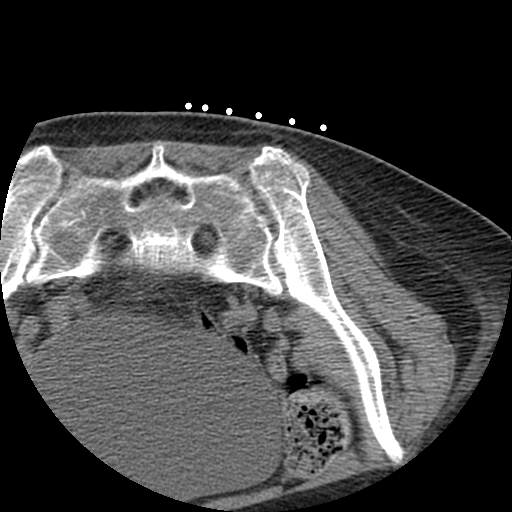
[im 6/25  soft-tissue]
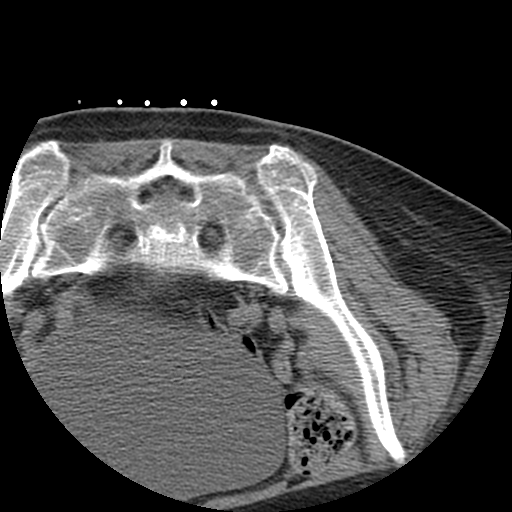
[im 8/25  soft-tissue]
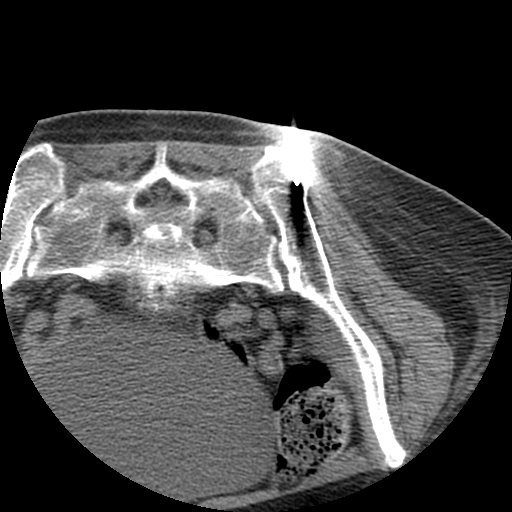
[im 9/25  soft-tissue]
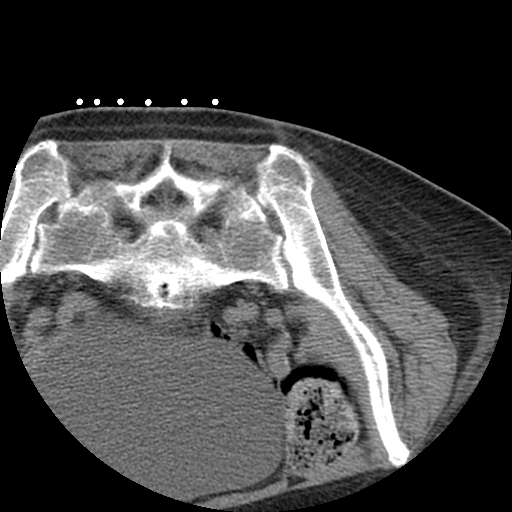
[im 11/25  soft-tissue]
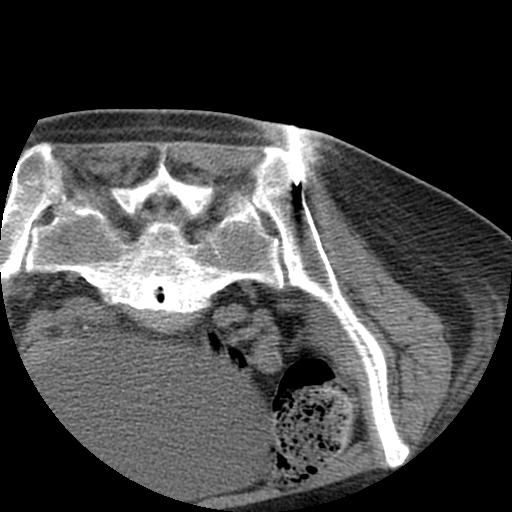
[im 13/25  soft-tissue]
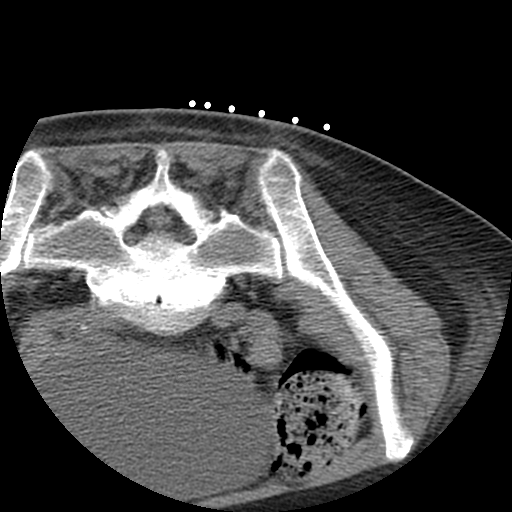
[im 15/25  soft-tissue]
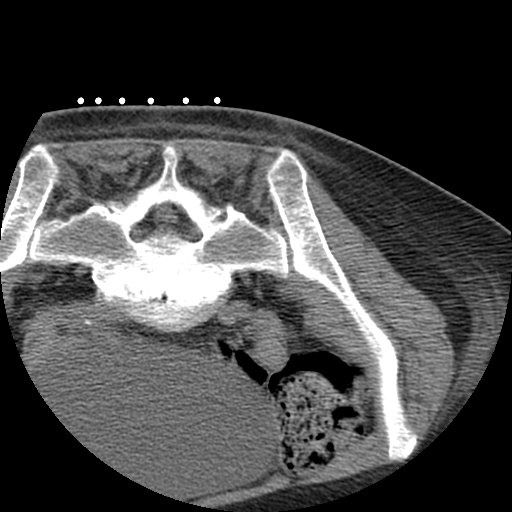
[im 17/25  soft-tissue]
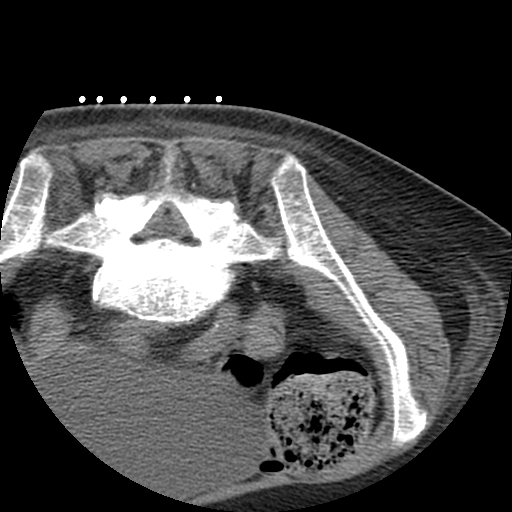
[im 17/25  bone]
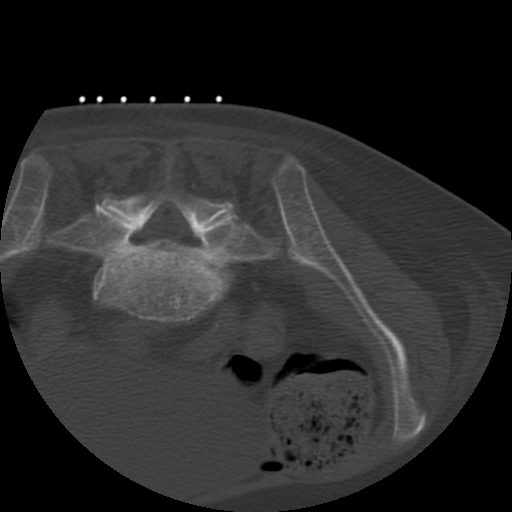
[im 18/25  soft-tissue]
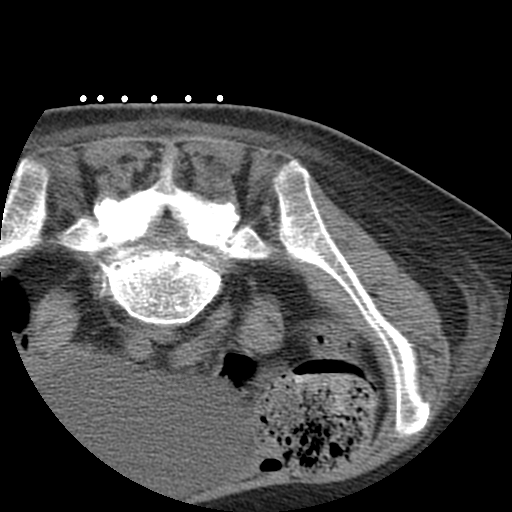
[im 20/25  soft-tissue]
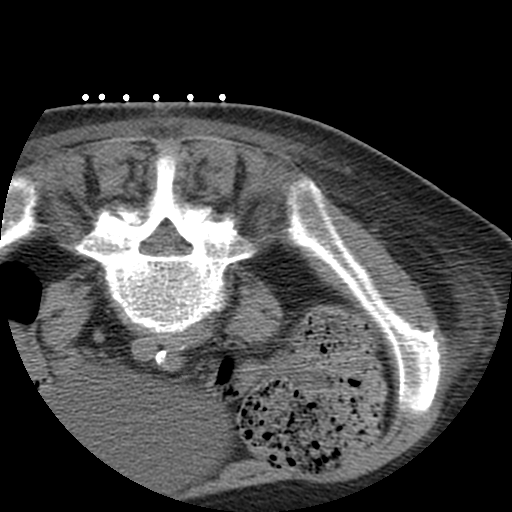
[im 21/25  lung]
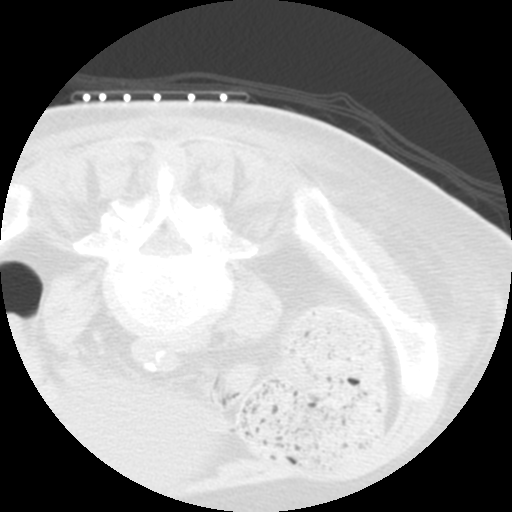
[im 22/25  soft-tissue]
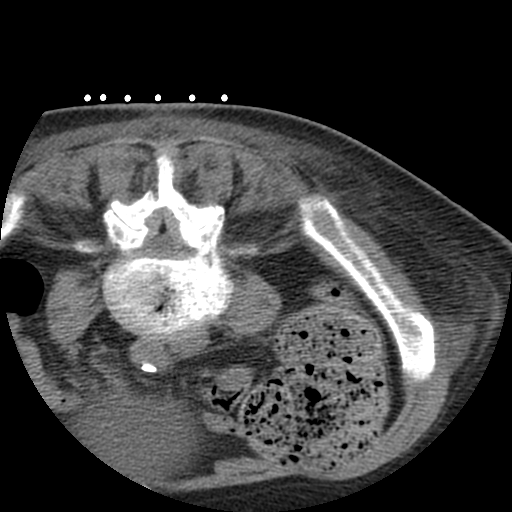
[im 22/25  lung]
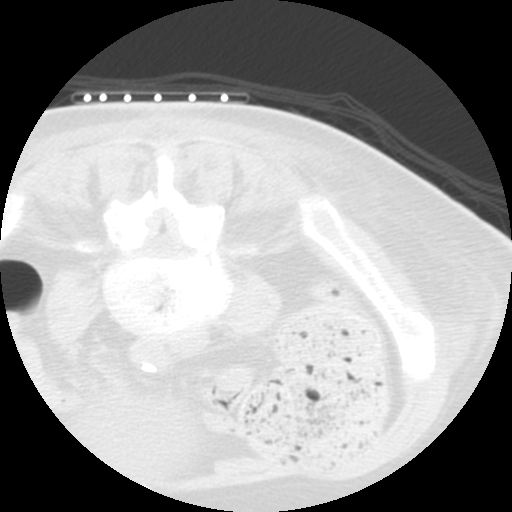
[im 23/25  lung]
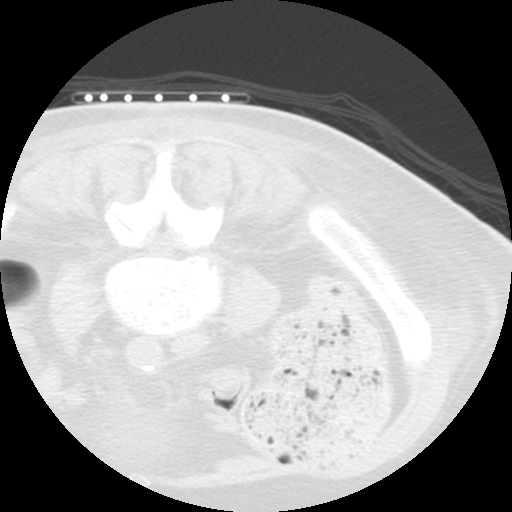
[im 24/25  soft-tissue]
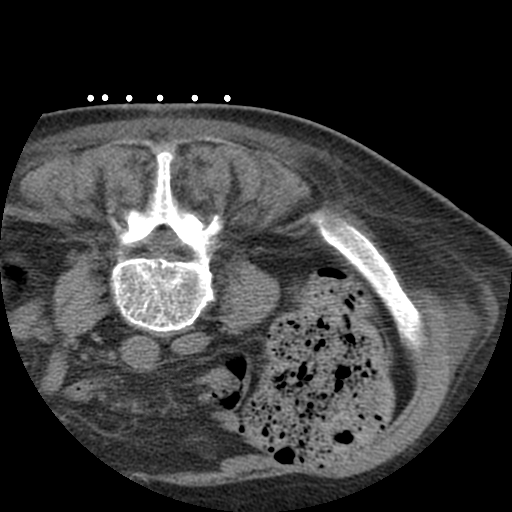
[im 24/25  lung]
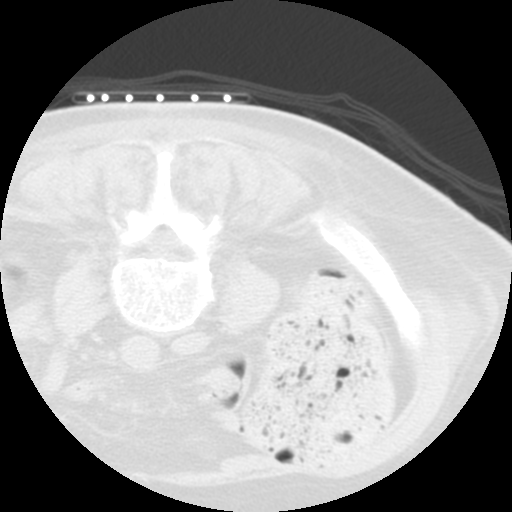

[15 of 25 positions shown; findings below may reference images not displayed]

CT GUIDED RIGHT ILIAC BONE MARROW ASPIRATION AND CORE BIOPSY

Date:  12/27/2010 [DATE]

Radiologist:  Sydnie Kumari, M.D.

Medications:  1 mg Versed, 50 mcg Fentanyl

Guidance:  CT

Sedation time:  15 minutes

Contrast volume:  None.

Complications:  No immediate

PROCEDURE/FINDINGS:

Informed consent was obtained from the patient following
explanation of the procedure, risks, benefits and alternatives.
The patient understands, agrees and consents for the procedure.
All questions were addressed.  A time out was performed.

The patient was positioned prone and noncontrast localization CT
was performed of the pelvis to demonstrate the iliac marrow spaces.

Maximal barrier sterile technique utilized including caps, mask,
sterile gowns, sterile gloves, large sterile drape, hand hygiene,
and betadine prep.

Under sterile conditions and local anesthesia, an 11 gauge coaxial
bone biopsy needle was advanced into the right iliac marrow space.
Needle position was confirmed with CT imaging. Initially, bone
marrow aspiration was performed. Next, the 11 gauge outer cannula
was utilized to obtain a right iliac bone marrow core biopsy.
Needle was removed. Hemostasis was obtained with compression. The
patient tolerated the procedure well. Samples were prepared with
the cytotechnologist. No immediate complications.
IMPRESSION: CT guided right iliac bone marrow aspiration and core biopsy.

## 2013-03-25 ENCOUNTER — Ambulatory Visit: Payer: Medicare Other | Admitting: Family Medicine

## 2013-04-16 ENCOUNTER — Ambulatory Visit: Payer: Medicare Other | Admitting: Internal Medicine

## 2014-07-14 ENCOUNTER — Encounter (HOSPITAL_COMMUNITY): Payer: Self-pay | Admitting: Emergency Medicine

## 2014-07-14 ENCOUNTER — Emergency Department (HOSPITAL_COMMUNITY)
Admission: EM | Admit: 2014-07-14 | Discharge: 2014-07-14 | Disposition: A | Discharge status: Home / Self Care | Payer: Medicare Other | Attending: Emergency Medicine | Admitting: Emergency Medicine

## 2014-07-14 DIAGNOSIS — Z8701 Personal history of pneumonia (recurrent): Secondary | ICD-10-CM | POA: Insufficient documentation

## 2014-07-14 DIAGNOSIS — Z8639 Personal history of other endocrine, nutritional and metabolic disease: Secondary | ICD-10-CM | POA: Diagnosis not present

## 2014-07-14 DIAGNOSIS — Z872 Personal history of diseases of the skin and subcutaneous tissue: Secondary | ICD-10-CM | POA: Insufficient documentation

## 2014-07-14 DIAGNOSIS — Z79899 Other long term (current) drug therapy: Secondary | ICD-10-CM | POA: Insufficient documentation

## 2014-07-14 DIAGNOSIS — I4891 Unspecified atrial fibrillation: Secondary | ICD-10-CM | POA: Insufficient documentation

## 2014-07-14 DIAGNOSIS — R040 Epistaxis: Secondary | ICD-10-CM | POA: Insufficient documentation

## 2014-07-14 DIAGNOSIS — Z856 Personal history of leukemia: Secondary | ICD-10-CM | POA: Diagnosis not present

## 2014-07-14 DIAGNOSIS — I5032 Chronic diastolic (congestive) heart failure: Secondary | ICD-10-CM | POA: Insufficient documentation

## 2014-07-14 DIAGNOSIS — D509 Iron deficiency anemia, unspecified: Secondary | ICD-10-CM | POA: Diagnosis not present

## 2014-07-14 DIAGNOSIS — Z8709 Personal history of other diseases of the respiratory system: Secondary | ICD-10-CM | POA: Diagnosis not present

## 2014-07-14 MED ORDER — OXYMETAZOLINE HCL 0.05 % NA SOLN
1.0000 | Freq: Once | NASAL | Status: AC
  Administered 2014-07-14: 1 via NASAL
  Filled 2014-07-14: qty 15

## 2014-07-14 MED ORDER — BACITRACIN 500 UNIT/GM EX OINT
1.0000 "application " | TOPICAL_OINTMENT | CUTANEOUS | Status: AC
  Administered 2014-07-14: 1 via TOPICAL
  Filled 2014-07-14: qty 0.9

## 2014-07-14 NOTE — Discharge Instructions (Signed)
Nosebleed Nosebleeds can be caused by many conditions, including trauma, infections, polyps, foreign bodies, dry mucous membranes or climate, medicines, and air conditioning. Most nosebleeds occur in the front of the nose. Because of this location, most nosebleeds can be controlled by pinching the nostrils gently and continuously for at least 10 to 20 minutes. The long, continuous pressure allows enough time for the blood to clot. If pressure is released during that 10 to 20 minute time period, the process may have to be started again. The nosebleed may stop by itself or quit with pressure, or it may need concentrated heating (cautery) or pressure from packing. HOME CARE INSTRUCTIONS   If your nose was packed, try to maintain the pack inside until your health care provider removes it. If a gauze pack was used and it starts to fall out, gently replace it or cut the end off. Do not cut if a balloon catheter was used to pack the nose. Otherwise, do not remove unless instructed.  Avoid blowing your nose for 12 hours after treatment. This could dislodge the pack or clot and start the bleeding again.  If the bleeding starts again, sit up and bend forward, gently pinching the front half of your nose continuously for 20 minutes.  If bleeding was caused by dry mucous membranes, use over-the-counter saline nasal spray or gel. This will keep the mucous membranes moist and allow them to heal. If you must use a lubricant, choose the water-soluble variety. Use it only sparingly and not within several hours of lying down.  Do not use petroleum jelly or mineral oil, as these may drip into the lungs and cause serious problems.  Maintain humidity in your home by using less air conditioning or by using a humidifier.  Do not use aspirin or medicines which make bleeding more likely. Your health care provider can give you recommendations on this.  Resume normal activities as you are able, but try to avoid straining,  lifting, or bending at the waist for several days.  If the nosebleeds become recurrent and the cause is unknown, your health care provider may suggest laboratory tests. SEEK MEDICAL CARE IF: You have a fever. SEEK IMMEDIATE MEDICAL CARE IF:   Bleeding recurs and cannot be controlled.  There is unusual bleeding from or bruising on other parts of the body.  Nosebleeds continue.  There is any worsening of the condition which originally brought you in.  You become light-headed, feel faint, become sweaty, or vomit blood. MAKE SURE YOU:   Understand these instructions.  Will watch your condition.  Will get help right away if you are not doing well or get worse. Document Released: 04/18/2005 Document Revised: 11/23/2013 Document Reviewed: 06/09/2009 [facility] Patient Information 2015 [place], [place]. This information is not intended to replace advice given to you by your health care provider. Make sure you discuss any questions you have with your health care provider.   Use a petroleum based jelly to lubricate the nose twice daily.

## 2014-07-14 NOTE — ED Notes (Signed)
Patient states when she woke up this morning she felt something wet on her face and realized her nose was bleeding.  Patient states her nose bled on an off for @ 45 minutes.  Patient complains of sense of fullness and congestion in her nose.  Patient denies weakness, lethargy or N/V.  Patient's nose has stopped bleeding.

## 2014-07-14 NOTE — ED Notes (Signed)
Pt here due to nose bleed tat started about 0100 this morning, stating it will stop but then start back again. Pt states when it started it was a lot but now it is just little. Pt is no ton blood thinners. Pt denies pain at the moment pt states while her nose was bleeding she had a sore throat.

## 2014-07-14 NOTE — ED Provider Notes (Signed)
CSN: [id number]     Arrival date & time 07/14/14  8921 History   First MD Initiated Contact with Patient 07/14/14 [phone]     Chief Complaint  Patient presents with  . Epistaxis     (Consider location/radiation/quality/duration/timing/severity/associated sxs/prior Treatment) Patient is a 80 y.o. female presenting with nosebleeds. The history is provided by the patient (a relative).  Epistaxis Location:  Bilateral Severity:  Mild Duration:  30 minutes Timing:  Intermittent Progression:  Resolved Chronicity:  New Context: weather change   Relieved by:  Applying pressure Worsened by:  Nothing tried Ineffective treatments:  None tried Associated symptoms: sore throat (resolved w/ nosebleed)   Associated symptoms: no congestion, no cough, no dizziness, no fever and no headaches     Past Medical History  Diagnosis Date  . Atrial fib/flutter, transient     Coumadin; status post TEE guided cardioversion 6/12  . Interstitial lung disease 12/17/10    CT scan  of chest, showed old compression fracture T6.  . Microcytic anemia     Iron deficient  . Thrombocytosis   . Hyponatremia   . history of Pneumonia 12/11/10  . Chronic diastolic heart failure     Echo 5/12: EF 60-65%, mild MR, mild BAE  . Chronic myelocytic leukemia     Dr. Ralene Ok  . Acute neutrophilic dermatosis     Sweet syndrome-prednisone therapy; [facility] dermatology   Past Surgical History  Procedure Laterality Date  . Hysterectomy      PARTIAL    Family History  Problem Relation Age of Onset  . Heart disease Mother 35  . Heart attack Father 46    died of heart attack.   History  Substance Use Topics  . Smoking status: Never Smoker   . Smokeless tobacco: Never Used  . Alcohol Use: No   OB History    No data available     Review of Systems  Constitutional: Negative for fever and fatigue.  HENT: Positive for nosebleeds and sore throat (resolved w/ nosebleed). Negative for congestion and drooling.    Eyes: Negative for pain.  Respiratory: Negative for cough and shortness of breath.   Cardiovascular: Negative for chest pain.  Gastrointestinal: Negative for nausea, vomiting, abdominal pain and diarrhea.  Genitourinary: Negative for dysuria and hematuria.  Musculoskeletal: Negative for back pain, gait problem and neck pain.  Skin: Negative for color change.  Neurological: Negative for dizziness and headaches.  Hematological: Negative for adenopathy.  Psychiatric/Behavioral: Negative for behavioral problems.  All other systems reviewed and are negative.     Allergies  Review of patient's allergies indicates no known allergies.  Home Medications   Prior to Admission medications   Medication Sig Start Date End Date Taking? Authorizing Provider  Multiple Vitamin (MULTIVITAMIN) tablet Take 1 tablet by mouth daily.      Historical Provider, MD   BP 145/100 mmHg  Pulse 89  Temp(Src) 97.7 F (36.5 C) (Oral)  Resp 20  SpO2 96% Physical Exam  Constitutional: She is oriented to person, place, and time. She appears well-developed and well-nourished.  HENT:  Head: Normocephalic.  Mouth/Throat: Oropharynx is clear and moist. No oropharyngeal exudate.  Normal-appearing nares bilaterally.  Eyes: Conjunctivae and EOM are normal. Pupils are equal, round, and reactive to light.  Neck: Normal range of motion. Neck supple.  Cardiovascular: Normal rate, regular rhythm, normal heart sounds and intact distal pulses.  Exam reveals no gallop and no friction rub.   No murmur heard. Pulmonary/Chest: Effort normal and breath sounds  normal. No respiratory distress. She has no wheezes.  Abdominal: Soft. Bowel sounds are normal. There is no tenderness. There is no rebound and no guarding.  Musculoskeletal: Normal range of motion. She exhibits no edema or tenderness.  Neurological: She is alert and oriented to person, place, and time.  Skin: Skin is warm and dry.  Psychiatric: She has a normal mood  and affect. Her behavior is normal.  Nursing note and vitals reviewed.   ED Course  Procedures (including critical care time) Labs Review Labs Reviewed - No data to display  Imaging Review No results found.   EKG Interpretation None      MDM   Final diagnoses:  Epistaxis    9:21 AM 80 y.o. female who presents with epistaxis which occurred around 1 AM this morning. She states that her nose bled for about 30 minutes. She is not sure if it was greater out of one nare or the other. She was able to stop it with compression. She notes that it has recurred since then but again stopped with compression. She is afebrile and vital signs are unremarkable here. She is currently asymptomatic on exam. Normal-appearing nares bilaterally. Will apply 2 sprays of Afrin and cut her anterior nares with bacitracin. I educated the patient and the family about epistaxis and how to stop it. Pt not on blood thinners.   9:55 AM: Pt remains asx. Will send home w/ afrin and rec petroleum based jelly to nose bid.  I have discussed the diagnosis/risks/treatment options with the patient and family and believe the pt to be eligible for discharge home to follow-up with her pcp as needed. We also discussed returning to the ED immediately if new or worsening sx occur. We discussed the sx which are most concerning (e.g., recurrent uncontrolled bleeding) that necessitate immediate return. Medications administered to the patient during their visit and any new prescriptions provided to the patient are listed below.  Medications given during this visit Medications  oxymetazoline (AFRIN) 0.05 % nasal spray 1 spray (1 spray Each Nare Given 07/14/14 0926)  bacitracin ointment 1 application (1 application Topical Given 07/14/14 0927)    New Prescriptions   No medications on file     Pamella Pert, MD 07/14/14 [phone]

## 2014-07-17 ENCOUNTER — Encounter (HOSPITAL_COMMUNITY): Payer: Self-pay | Admitting: Emergency Medicine

## 2014-07-17 ENCOUNTER — Emergency Department (HOSPITAL_COMMUNITY)
Admission: EM | Admit: 2014-07-17 | Discharge: 2014-07-17 | Disposition: A | Discharge status: Home / Self Care | Payer: Medicare Other | Attending: Emergency Medicine | Admitting: Emergency Medicine

## 2014-07-17 DIAGNOSIS — Z862 Personal history of diseases of the blood and blood-forming organs and certain disorders involving the immune mechanism: Secondary | ICD-10-CM | POA: Diagnosis not present

## 2014-07-17 DIAGNOSIS — I4891 Unspecified atrial fibrillation: Secondary | ICD-10-CM | POA: Insufficient documentation

## 2014-07-17 DIAGNOSIS — Z872 Personal history of diseases of the skin and subcutaneous tissue: Secondary | ICD-10-CM | POA: Insufficient documentation

## 2014-07-17 DIAGNOSIS — Z8639 Personal history of other endocrine, nutritional and metabolic disease: Secondary | ICD-10-CM | POA: Insufficient documentation

## 2014-07-17 DIAGNOSIS — Z8709 Personal history of other diseases of the respiratory system: Secondary | ICD-10-CM | POA: Insufficient documentation

## 2014-07-17 DIAGNOSIS — Z856 Personal history of leukemia: Secondary | ICD-10-CM | POA: Diagnosis not present

## 2014-07-17 DIAGNOSIS — Z8701 Personal history of pneumonia (recurrent): Secondary | ICD-10-CM | POA: Diagnosis not present

## 2014-07-17 DIAGNOSIS — R040 Epistaxis: Secondary | ICD-10-CM

## 2014-07-17 DIAGNOSIS — I5032 Chronic diastolic (congestive) heart failure: Secondary | ICD-10-CM | POA: Insufficient documentation

## 2014-07-17 LAB — CBC WITH DIFFERENTIAL/PLATELET
Basophils Absolute: 0.2 10*3/uL — ABNORMAL HIGH (ref 0.0–0.1)
Basophils Relative: 1 % (ref 0–1)
EOS PCT: 1 % (ref 0–5)
Eosinophils Absolute: 0.2 10*3/uL (ref 0.0–0.7)
HCT: 37.9 % (ref 36.0–46.0)
HEMOGLOBIN: 11.6 g/dL — AB (ref 12.0–15.0)
LYMPHS ABS: 1.6 10*3/uL (ref 0.7–4.0)
Lymphocytes Relative: 10 % — ABNORMAL LOW (ref 12–46)
MCH: 24.9 pg — ABNORMAL LOW (ref 26.0–34.0)
MCHC: 30.6 g/dL (ref 30.0–36.0)
MCV: 81.5 fL (ref 78.0–100.0)
Monocytes Absolute: 1 10*3/uL (ref 0.1–1.0)
Monocytes Relative: 6 % (ref 3–12)
Neutro Abs: 13.3 10*3/uL — ABNORMAL HIGH (ref 1.7–7.7)
Neutrophils Relative %: 82 % — ABNORMAL HIGH (ref 43–77)
Platelets: 317 10*3/uL (ref 150–400)
RBC: 4.65 MIL/uL (ref 3.87–5.11)
RDW: 17.4 % — ABNORMAL HIGH (ref 11.5–15.5)
WBC: 16.3 10*3/uL — ABNORMAL HIGH (ref 4.0–10.5)

## 2014-07-17 LAB — COMPREHENSIVE METABOLIC PANEL
ALK PHOS: 86 U/L (ref 39–117)
ALT: 11 U/L (ref 0–35)
AST: 25 U/L (ref 0–37)
Albumin: 4 g/dL (ref 3.5–5.2)
Anion gap: 7 (ref 5–15)
BUN: 27 mg/dL — ABNORMAL HIGH (ref 6–23)
CO2: 24 mmol/L (ref 19–32)
Calcium: 9.1 mg/dL (ref 8.4–10.5)
Chloride: 105 mEq/L (ref 96–112)
Creatinine, Ser: 0.85 mg/dL (ref 0.50–1.10)
GFR calc Af Amer: 68 mL/min — ABNORMAL LOW (ref >90)
GFR calc non Af Amer: 59 mL/min — ABNORMAL LOW (ref >90)
GLUCOSE: 100 mg/dL — AB (ref 70–99)
POTASSIUM: 4.4 mmol/L (ref 3.5–5.1)
SODIUM: 136 mmol/L (ref 135–145)
TOTAL PROTEIN: 7.2 g/dL (ref 6.0–8.3)
Total Bilirubin: 0.5 mg/dL (ref 0.3–1.2)

## 2014-07-17 LAB — PROTIME-INR
INR: 1.07 (ref 0.00–1.49)
Prothrombin Time: 14 seconds (ref 11.6–15.2)

## 2014-07-17 MED ORDER — OXYMETAZOLINE HCL 0.05 % NA SOLN
1.0000 | Freq: Once | NASAL | Status: AC
  Administered 2014-07-17: 1 via NASAL
  Filled 2014-07-17: qty 15

## 2014-07-17 NOTE — ED Provider Notes (Signed)
CSN: [id number]     Arrival date & time 07/17/14  1634 History   First MD Initiated Contact with Patient 07/17/14 1841     Chief Complaint  Patient presents with  . Epistaxis  . eye bleeding      (Consider location/radiation/quality/duration/timing/severity/associated sxs/prior Treatment) Patient is a 80 y.o. female presenting with nosebleeds.  Epistaxis Location:  R nare Severity:  Mild Duration: 10 hours. Timing:  Constant Progression:  Unchanged Chronicity:  New Context: not anticoagulants and not bleeding disorder   Relieved by:  Applying pressure Worsened by:  Nothing tried Ineffective treatments:  None tried Associated symptoms: no blood in oropharynx   Associated symptoms comment:  Bleeding from medial canthus   Past Medical History  Diagnosis Date  . Atrial fib/flutter, transient     Coumadin; status post TEE guided cardioversion 6/12  . Interstitial lung disease 12/17/10    CT scan  of chest, showed old compression fracture T6.  . Microcytic anemia     Iron deficient  . Thrombocytosis   . Hyponatremia   . history of Pneumonia 12/11/10  . Chronic diastolic heart failure     Echo 5/12: EF 60-65%, mild MR, mild BAE  . Chronic myelocytic leukemia     Dr. Ralene Ok  . Acute neutrophilic dermatosis     Sweet syndrome-prednisone therapy; [facility] dermatology   Past Surgical History  Procedure Laterality Date  . Hysterectomy      PARTIAL    Family History  Problem Relation Age of Onset  . Heart disease Mother 23  . Heart attack Father 65    died of heart attack.   History  Substance Use Topics  . Smoking status: Never Smoker   . Smokeless tobacco: Never Used  . Alcohol Use: No   OB History    No data available     Review of Systems  HENT: Positive for nosebleeds.   All other systems reviewed and are negative.     Allergies  Review of patient's allergies indicates no known allergies.  Home Medications   Prior to Admission medications    Not on File   BP 103/85 mmHg  Pulse 88  Temp(Src) 97.2 F (36.2 C) (Oral)  Resp 18  SpO2 99% Physical Exam  Constitutional: She is oriented to person, place, and time. She appears well-developed and well-nourished.  HENT:  Head: Normocephalic and atraumatic.  Right Ear: External ear normal.  Left Ear: External ear normal.  Nose: Epistaxis (dried in R nare, obvious clotted anterior source) is observed.  Eyes: Conjunctivae and EOM are normal. Pupils are equal, round, and reactive to light.  Neck: Normal range of motion. Neck supple.  Cardiovascular: Normal rate, regular rhythm, normal heart sounds and intact distal pulses.   Pulmonary/Chest: Effort normal and breath sounds normal.  Abdominal: Soft. Bowel sounds are normal. There is no tenderness.  Musculoskeletal: Normal range of motion.  Neurological: She is alert and oriented to person, place, and time.  Skin: Skin is warm and dry.  Vitals reviewed.   ED Course  Procedures (including critical care time) Labs Review Labs Reviewed  COMPREHENSIVE METABOLIC PANEL - Abnormal; Notable for the following:    Glucose, Bld 100 (*)    BUN 27 (*)    GFR calc non Af Amer 59 (*)    GFR calc Af Amer 68 (*)    All other components within normal limits  CBC WITH DIFFERENTIAL - Abnormal; Notable for the following:    WBC 16.3 (*)  Hemoglobin 11.6 (*)    MCH 24.9 (*)    RDW 17.4 (*)    Neutrophils Relative % 82 (*)    Lymphocytes Relative 10 (*)    Neutro Abs 13.3 (*)    Basophils Absolute 0.2 (*)    All other components within normal limits  PROTIME-INR    Imaging Review No results found.   EKG Interpretation None      MDM   Final diagnoses:  Epistaxis  Recurrent epistaxis    80 y.o. female with pertinent PMH of recent epistaxis and visit for the same, afib not on medication, dCHF presents with recurrent right-sided epistaxis. No signs of posterior bleeding. No systemic symptoms. Bleeding mild and controlled with  pressure. On my examination bleeding is absent. She does have an obvious right anterior clot which is hemostatic.  Discussed use of Afrin in standard control techniques for epistaxis with patient. Also discussed possibility of nasal packing, with shared decision-making decided to defer at this time. The patient will call for ENT follow-up in 2 days. Lab work checked today with coagulopathy. Discharged home in stable condition.   I have reviewed all laboratory and imaging studies if ordered as above  1. Epistaxis   2. Recurrent epistaxis         Debby Freiberg, MD 07/17/14 2249

## 2014-07-17 NOTE — ED Notes (Signed)
ICE Pack provided

## 2014-07-17 NOTE — ED Notes (Signed)
Bleeding controlled at this time.

## 2014-07-17 NOTE — Discharge Instructions (Signed)

## 2014-07-17 NOTE — ED Notes (Signed)
Pt. Denied pain, alert and oriented x3, no bleeding noted.

## 2014-07-17 NOTE — ED Notes (Signed)
MD Gentry at bedside.

## 2014-07-17 NOTE — ED Notes (Signed)
Pt states that she has had nose bleeds intermittently since Wed. Pt states that this episode started this morning.  Pt is also bleeding from her right eye. Pt denies being on any blood thinners.

## 2014-07-17 NOTE — ED Notes (Signed)
Small amount of blood to right eye and right nostril bleeding. Pt reports bleeding intermittently since 0900

## 2015-02-09 ENCOUNTER — Ambulatory Visit (HOSPITAL_COMMUNITY): Payer: Medicare Other

## 2015-02-09 ENCOUNTER — Inpatient Hospital Stay (HOSPITAL_COMMUNITY)
Admission: EM | Admit: 2015-02-09 | Discharge: 2015-02-23 | DRG: 308 | Disposition: A | Discharge status: Skilled Nursing Facility | Payer: Medicare Other | Attending: Family Medicine | Admitting: Family Medicine

## 2015-02-09 ENCOUNTER — Encounter (HOSPITAL_COMMUNITY): Payer: Self-pay | Admitting: Emergency Medicine

## 2015-02-09 DIAGNOSIS — L989 Disorder of the skin and subcutaneous tissue, unspecified: Secondary | ICD-10-CM | POA: Diagnosis present

## 2015-02-09 DIAGNOSIS — R531 Weakness: Secondary | ICD-10-CM | POA: Diagnosis not present

## 2015-02-09 DIAGNOSIS — R627 Adult failure to thrive: Secondary | ICD-10-CM | POA: Diagnosis present

## 2015-02-09 DIAGNOSIS — T380X5A Adverse effect of glucocorticoids and synthetic analogues, initial encounter: Secondary | ICD-10-CM | POA: Diagnosis not present

## 2015-02-09 DIAGNOSIS — Z781 Physical restraint status: Secondary | ICD-10-CM | POA: Diagnosis not present

## 2015-02-09 DIAGNOSIS — R197 Diarrhea, unspecified: Secondary | ICD-10-CM | POA: Diagnosis present

## 2015-02-09 DIAGNOSIS — Z806 Family history of leukemia: Secondary | ICD-10-CM | POA: Diagnosis not present

## 2015-02-09 DIAGNOSIS — D62 Acute posthemorrhagic anemia: Secondary | ICD-10-CM | POA: Diagnosis present

## 2015-02-09 DIAGNOSIS — D72829 Elevated white blood cell count, unspecified: Secondary | ICD-10-CM | POA: Diagnosis not present

## 2015-02-09 DIAGNOSIS — C9290 Myeloid leukemia, unspecified, not having achieved remission: Secondary | ICD-10-CM

## 2015-02-09 DIAGNOSIS — D469 Myelodysplastic syndrome, unspecified: Secondary | ICD-10-CM

## 2015-02-09 DIAGNOSIS — I5033 Acute on chronic diastolic (congestive) heart failure: Secondary | ICD-10-CM | POA: Diagnosis present

## 2015-02-09 DIAGNOSIS — W06XXXA Fall from bed, initial encounter: Secondary | ICD-10-CM | POA: Diagnosis not present

## 2015-02-09 DIAGNOSIS — K921 Melena: Secondary | ICD-10-CM | POA: Insufficient documentation

## 2015-02-09 DIAGNOSIS — N390 Urinary tract infection, site not specified: Secondary | ICD-10-CM | POA: Diagnosis not present

## 2015-02-09 DIAGNOSIS — Y95 Nosocomial condition: Secondary | ICD-10-CM | POA: Diagnosis present

## 2015-02-09 DIAGNOSIS — Z8249 Family history of ischemic heart disease and other diseases of the circulatory system: Secondary | ICD-10-CM | POA: Diagnosis not present

## 2015-02-09 DIAGNOSIS — Z8701 Personal history of pneumonia (recurrent): Secondary | ICD-10-CM

## 2015-02-09 DIAGNOSIS — H547 Unspecified visual loss: Secondary | ICD-10-CM | POA: Diagnosis present

## 2015-02-09 DIAGNOSIS — C9310 Chronic myelomonocytic leukemia not having achieved remission: Secondary | ICD-10-CM | POA: Diagnosis present

## 2015-02-09 DIAGNOSIS — D47Z9 Other specified neoplasms of uncertain behavior of lymphoid, hematopoietic and related tissue: Secondary | ICD-10-CM | POA: Diagnosis not present

## 2015-02-09 DIAGNOSIS — I482 Chronic atrial fibrillation, unspecified: Secondary | ICD-10-CM

## 2015-02-09 DIAGNOSIS — L982 Febrile neutrophilic dermatosis [Sweet]: Secondary | ICD-10-CM | POA: Diagnosis not present

## 2015-02-09 DIAGNOSIS — Z66 Do not resuscitate: Secondary | ICD-10-CM | POA: Diagnosis present

## 2015-02-09 DIAGNOSIS — F0390 Unspecified dementia without behavioral disturbance: Secondary | ICD-10-CM | POA: Diagnosis present

## 2015-02-09 DIAGNOSIS — D473 Essential (hemorrhagic) thrombocythemia: Secondary | ICD-10-CM | POA: Diagnosis present

## 2015-02-09 DIAGNOSIS — I4891 Unspecified atrial fibrillation: Secondary | ICD-10-CM | POA: Diagnosis present

## 2015-02-09 DIAGNOSIS — R0902 Hypoxemia: Secondary | ICD-10-CM

## 2015-02-09 DIAGNOSIS — N12 Tubulo-interstitial nephritis, not specified as acute or chronic: Secondary | ICD-10-CM | POA: Diagnosis present

## 2015-02-09 DIAGNOSIS — J811 Chronic pulmonary edema: Secondary | ICD-10-CM

## 2015-02-09 DIAGNOSIS — I481 Persistent atrial fibrillation: Secondary | ICD-10-CM | POA: Diagnosis not present

## 2015-02-09 DIAGNOSIS — I951 Orthostatic hypotension: Secondary | ICD-10-CM | POA: Diagnosis present

## 2015-02-09 DIAGNOSIS — K624 Stenosis of anus and rectum: Secondary | ICD-10-CM | POA: Diagnosis present

## 2015-02-09 DIAGNOSIS — C9210 Chronic myeloid leukemia, BCR/ABL-positive, not having achieved remission: Secondary | ICD-10-CM | POA: Diagnosis present

## 2015-02-09 DIAGNOSIS — D509 Iron deficiency anemia, unspecified: Secondary | ICD-10-CM | POA: Diagnosis present

## 2015-02-09 DIAGNOSIS — K5909 Other constipation: Secondary | ICD-10-CM | POA: Diagnosis not present

## 2015-02-09 DIAGNOSIS — R339 Retention of urine, unspecified: Secondary | ICD-10-CM | POA: Diagnosis not present

## 2015-02-09 DIAGNOSIS — I5032 Chronic diastolic (congestive) heart failure: Secondary | ICD-10-CM | POA: Diagnosis present

## 2015-02-09 DIAGNOSIS — R8271 Bacteriuria: Secondary | ICD-10-CM | POA: Diagnosis present

## 2015-02-09 DIAGNOSIS — K5649 Other impaction of intestine: Secondary | ICD-10-CM | POA: Diagnosis present

## 2015-02-09 DIAGNOSIS — Z515 Encounter for palliative care: Secondary | ICD-10-CM

## 2015-02-09 DIAGNOSIS — Z7982 Long term (current) use of aspirin: Secondary | ICD-10-CM | POA: Diagnosis not present

## 2015-02-09 DIAGNOSIS — G92 Toxic encephalopathy: Secondary | ICD-10-CM | POA: Diagnosis not present

## 2015-02-09 DIAGNOSIS — K922 Gastrointestinal hemorrhage, unspecified: Secondary | ICD-10-CM | POA: Diagnosis not present

## 2015-02-09 DIAGNOSIS — J189 Pneumonia, unspecified organism: Secondary | ICD-10-CM | POA: Insufficient documentation

## 2015-02-09 DIAGNOSIS — I5031 Acute diastolic (congestive) heart failure: Secondary | ICD-10-CM | POA: Diagnosis not present

## 2015-02-09 DIAGNOSIS — K5904 Chronic idiopathic constipation: Secondary | ICD-10-CM | POA: Diagnosis present

## 2015-02-09 DIAGNOSIS — I4892 Unspecified atrial flutter: Secondary | ICD-10-CM | POA: Diagnosis present

## 2015-02-09 LAB — COMPREHENSIVE METABOLIC PANEL
ALBUMIN: 3.8 g/dL (ref 3.5–5.0)
ALK PHOS: 67 U/L (ref 38–126)
ALT: 13 U/L — ABNORMAL LOW (ref 14–54)
AST: 27 U/L (ref 15–41)
Anion gap: 8 (ref 5–15)
BILIRUBIN TOTAL: 0.8 mg/dL (ref 0.3–1.2)
BUN: 35 mg/dL — ABNORMAL HIGH (ref 6–20)
CALCIUM: 8.9 mg/dL (ref 8.9–10.3)
CHLORIDE: 107 mmol/L (ref 101–111)
CO2: 21 mmol/L — AB (ref 22–32)
CREATININE: 0.96 mg/dL (ref 0.44–1.00)
GFR, EST AFRICAN AMERICAN: 59 mL/min — AB (ref >60)
GFR, EST NON AFRICAN AMERICAN: 51 mL/min — AB (ref >60)
Glucose, Bld: 110 mg/dL — ABNORMAL HIGH (ref 65–99)
POTASSIUM: 4.3 mmol/L (ref 3.5–5.1)
Sodium: 136 mmol/L (ref 135–145)
Total Protein: 7.2 g/dL (ref 6.5–8.1)

## 2015-02-09 LAB — URINALYSIS, ROUTINE W REFLEX MICROSCOPIC
Bilirubin Urine: NEGATIVE
Glucose, UA: NEGATIVE mg/dL
KETONES UR: 15 mg/dL — AB
NITRITE: POSITIVE — AB
Protein, ur: 30 mg/dL — AB
Specific Gravity, Urine: 1.024 (ref 1.005–1.030)
Urobilinogen, UA: 0.2 mg/dL (ref 0.0–1.0)
pH: 5.5 (ref 5.0–8.0)

## 2015-02-09 LAB — URINE MICROSCOPIC-ADD ON

## 2015-02-09 LAB — TSH: TSH: 4.298 u[IU]/mL (ref 0.350–4.500)

## 2015-02-09 LAB — CBC
HCT: 40.1 % (ref 36.0–46.0)
Hemoglobin: 12.6 g/dL (ref 12.0–15.0)
MCH: 24.6 pg — ABNORMAL LOW (ref 26.0–34.0)
MCHC: 31.4 g/dL (ref 30.0–36.0)
MCV: 78.3 fL (ref 78.0–100.0)
PLATELETS: 287 10*3/uL (ref 150–400)
RBC: 5.12 MIL/uL — AB (ref 3.87–5.11)
RDW: 19.3 % — AB (ref 11.5–15.5)
WBC: 31.8 10*3/uL — ABNORMAL HIGH (ref 4.0–10.5)

## 2015-02-09 LAB — PROTIME-INR
INR: 1.21 (ref 0.00–1.49)
Prothrombin Time: 15.4 seconds — ABNORMAL HIGH (ref 11.6–15.2)

## 2015-02-09 LAB — MRSA PCR SCREENING: MRSA by PCR: NEGATIVE

## 2015-02-09 LAB — I-STAT CG4 LACTIC ACID, ED: Lactic Acid, Venous: 1.75 mmol/L (ref 0.5–2.0)

## 2015-02-09 LAB — APTT: aPTT: 32 seconds (ref 24–37)

## 2015-02-09 LAB — LIPASE, BLOOD: LIPASE: 23 U/L (ref 22–51)

## 2015-02-09 LAB — POC OCCULT BLOOD, ED: Fecal Occult Bld: POSITIVE — AB

## 2015-02-09 LAB — CLOSTRIDIUM DIFFICILE BY PCR: CDIFFPCR: NEGATIVE

## 2015-02-09 LAB — MAGNESIUM: Magnesium: 1.8 mg/dL (ref 1.7–2.4)

## 2015-02-09 MED ORDER — SODIUM CHLORIDE 0.9 % IV SOLN
INTRAVENOUS | Status: DC
  Administered 2015-02-09 – 2015-02-11 (×4): via INTRAVENOUS

## 2015-02-09 MED ORDER — ONDANSETRON HCL 4 MG/2ML IJ SOLN
4.0000 mg | Freq: Four times a day (QID) | INTRAMUSCULAR | Status: DC | PRN
  Administered 2015-02-19: 4 mg via INTRAVENOUS
  Filled 2015-02-09: qty 2

## 2015-02-09 MED ORDER — LIDOCAINE HCL 2 % EX GEL
1.0000 "application " | Freq: Once | CUTANEOUS | Status: AC
  Administered 2015-02-09: 1 via TOPICAL
  Filled 2015-02-09: qty 10

## 2015-02-09 MED ORDER — CIPROFLOXACIN IN D5W 400 MG/200ML IV SOLN
400.0000 mg | Freq: Once | INTRAVENOUS | Status: AC
  Administered 2015-02-09: 400 mg via INTRAVENOUS
  Filled 2015-02-09: qty 200

## 2015-02-09 MED ORDER — ACETAMINOPHEN 325 MG PO TABS: 650.0000 mg | ORAL_TABLET | ORAL | Status: DC | PRN

## 2015-02-09 MED ORDER — SORBITOL 70 % SOLN
30.0000 mL | Freq: Every day | Status: DC
  Administered 2015-02-09 – 2015-02-13 (×5): 30 mL via ORAL
  Filled 2015-02-09 (×5): qty 30

## 2015-02-09 MED ORDER — DILTIAZEM HCL 100 MG IV SOLR
5.0000 mg/h | Freq: Once | INTRAVENOUS | Status: AC
  Administered 2015-02-09: 5 mg/h via INTRAVENOUS
  Filled 2015-02-09: qty 100

## 2015-02-09 MED ORDER — SODIUM CHLORIDE 0.9 % IV BOLUS (SEPSIS)
1000.0000 mL | Freq: Once | INTRAVENOUS | Status: AC
  Administered 2015-02-09: 1000 mL via INTRAVENOUS

## 2015-02-09 MED ORDER — HEPARIN BOLUS VIA INFUSION
2550.0000 [IU] | Freq: Once | INTRAVENOUS | Status: AC
  Administered 2015-02-09: 2550 [IU] via INTRAVENOUS
  Filled 2015-02-09: qty 2550

## 2015-02-09 MED ORDER — SODIUM CHLORIDE 0.9 % IV SOLN: INTRAVENOUS | Status: AC

## 2015-02-09 MED ORDER — METRONIDAZOLE IN NACL 5-0.79 MG/ML-% IV SOLN
500.0000 mg | Freq: Once | INTRAVENOUS | Status: DC
  Filled 2015-02-09: qty 100

## 2015-02-09 MED ORDER — IOHEXOL 300 MG/ML  SOLN
100.0000 mL | Freq: Once | INTRAMUSCULAR | Status: AC | PRN
Start: 2015-02-09 — End: 2015-02-09
  Administered 2015-02-09: 80 mL via INTRAVENOUS

## 2015-02-09 MED ORDER — IOHEXOL 300 MG/ML  SOLN
50.0000 mL | Freq: Once | INTRAMUSCULAR | Status: AC | PRN
  Administered 2015-02-09: 50 mL via ORAL

## 2015-02-09 MED ORDER — DILTIAZEM HCL 100 MG IV SOLR
5.0000 mg/h | INTRAVENOUS | Status: DC
  Administered 2015-02-09 – 2015-02-11 (×2): 5 mg/h via INTRAVENOUS
  Administered 2015-02-11: 10 mg/h via INTRAVENOUS
  Filled 2015-02-09 (×6): qty 100

## 2015-02-09 MED ORDER — HEPARIN (PORCINE) IN NACL 100-0.45 UNIT/ML-% IJ SOLN
750.0000 [IU]/h | INTRAMUSCULAR | Status: DC
  Administered 2015-02-09: 750 [IU]/h via INTRAVENOUS
  Filled 2015-02-09: qty 250

## 2015-02-09 NOTE — ED Notes (Signed)
Pt c/o constipation x 8 days, rectal soreness, followed by diarrhea x 3 days after taking laxatives.. Denies nausea, emesis, abdominal pain.

## 2015-02-09 NOTE — ED Provider Notes (Signed)
CSN: [id number]     Arrival date & time 02/09/15  1139 History   First MD Initiated Contact with Patient 02/09/15 1250     Chief Complaint  Patient presents with  . Diarrhea     (Consider location/radiation/quality/duration/timing/severity/associated sxs/prior Treatment) Patient is a 81 y.o. female presenting with diarrhea. The history is provided by the patient and a relative. The history is limited by the condition of the patient.  Diarrhea Quality:  Watery Severity:  Moderate Number of episodes:  3 Timing:  Constant Progression:  Unchanged Relieved by:  Nothing Worsened by:  Nothing tried Ineffective treatments:  None tried Associated symptoms: no abdominal pain, no arthralgias, no chills, no recent cough, no diaphoresis, no fever, no headaches, no myalgias and no vomiting   Associated symptoms comment:  Was initially constipated for 5 day Risk factors: no recent antibiotic use, no sick contacts, no suspicious food intake and no travel to endemic areas     Past Medical History  Diagnosis Date  . Atrial fib/flutter, transient     Coumadin; status post TEE guided cardioversion 6/12  . Interstitial lung disease 12/17/10    CT scan  of chest, showed old compression fracture T6.  . Microcytic anemia     Iron deficient  . Thrombocytosis   . Hyponatremia   . history of Pneumonia 12/11/10  . Chronic diastolic heart failure     Echo 5/12: EF 60-65%, mild MR, mild BAE  . Chronic myelocytic leukemia     Dr. Ralene Ok  . 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 Hysterectomy      PARTIAL    Family History  Problem Relation Age of Onset  . Heart disease Mother 72  . Heart attack Father 36    died of heart attack.   History  Substance Use Topics  . Smoking status: Never Smoker   . Smokeless tobacco: Never Used  . Alcohol Use: No   OB History    No data available      Review of Systems  Constitutional: Negative for fever, chills, diaphoresis, activity change, appetite change and fatigue.  HENT: Negative for congestion, facial swelling, rhinorrhea and sore throat.   Eyes: Negative for photophobia and discharge.  Respiratory: Negative for cough, chest tightness and shortness of breath.   Cardiovascular: Negative for chest pain, palpitations and leg swelling.  Gastrointestinal: Positive for diarrhea. Negative for nausea, vomiting and abdominal pain.  Endocrine: Negative for polydipsia and polyuria.  Genitourinary: Negative for dysuria, frequency, difficulty urinating and pelvic pain.  Musculoskeletal: Negative for myalgias, back pain, arthralgias, neck pain and neck stiffness.  Skin: Negative for color change and wound.  Allergic/Immunologic: Negative for immunocompromised state.  Neurological: Negative for facial asymmetry, weakness, numbness and headaches.  Hematological: Does not bruise/bleed easily.  Psychiatric/Behavioral: Positive for confusion. Negative for agitation.      Allergies  Review of patient's allergies indicates no known allergies.  Home Medications   Prior to Admission medications   Medication Sig Start Date End Date Taking? Authorizing Provider  Sennosides (EX-LAX PO) Take 2 tablets by mouth daily as needed (constipation).   Yes Historical Provider, MD   BP 124/73 mmHg  Pulse 131  Temp(Src) 97.6 F (36.4 C) (Oral)  Resp 20  SpO2 96% Physical Exam  Constitutional: She is oriented to person, place, and time. She appears well-developed and well-nourished. No distress.  HENT:  Head: Normocephalic and atraumatic.  Mouth/Throat: No oropharyngeal exudate.  Eyes: Pupils are equal, round, and reactive to light.  Neck: Normal range of motion. Neck supple.  Cardiovascular: Normal rate, regular rhythm and normal heart sounds.  Exam reveals no gallop and no friction rub.   No murmur heard. Pulmonary/Chest: Effort normal and  breath sounds normal. No respiratory distress. She has no wheezes. She has no rales.  Abdominal: Soft. Bowel sounds are normal. She exhibits no distension and no mass. There is tenderness in the right lower quadrant, suprapubic area and left lower quadrant. There is no rebound and no guarding.  Musculoskeletal: Normal range of motion. She exhibits no edema or tenderness.  Neurological: She is alert and oriented to person, place, and time.  Skin: Skin is warm and dry.  Psychiatric: She has a normal mood and affect.    ED Course  Procedures (including critical care time) Labs Review Labs Reviewed  COMPREHENSIVE METABOLIC PANEL - Abnormal; Notable for the following:    CO2 21 (*)    Glucose, Bld 110 (*)    BUN 35 (*)    ALT 13 (*)    GFR calc non Af Amer 51 (*)    GFR calc Af Amer 59 (*)    All other components within normal limits  CBC - Abnormal; Notable for the following:    WBC 31.8 (*)    RBC 5.12 (*)    MCH 24.6 (*)    RDW 19.3 (*)    All other components within normal limits  URINALYSIS, ROUTINE W REFLEX MICROSCOPIC (NOT AT [facility]) - Abnormal; Notable for the following:    APPearance CLOUDY (*)    Hgb urine dipstick LARGE (*)    Ketones, ur 15 (*)    Protein, ur 30 (*)    Nitrite POSITIVE (*)    Leukocytes, UA MODERATE (*)    All other components within normal limits  URINE MICROSCOPIC-ADD ON - Abnormal; Notable for the following:    Bacteria, UA MANY (*)    All other components within normal limits  POC OCCULT BLOOD, ED - Abnormal; Notable for the following:    Fecal Occult Bld POSITIVE (*)    All other components within normal limits  CLOSTRIDIUM DIFFICILE BY PCR (NOT AT [facility])  LIPASE, BLOOD  I-STAT CG4 LACTIC ACID, ED    Imaging Review No results found.   EKG Interpretation   Date/Time:  Wednesday February 09 2015 13:05:16 EDT Ventricular Rate:  128 PR Interval:  64 QRS Duration: 81 QT Interval:  327 QTC Calculation: 477 R Axis:   24 Text Interpretation:   atrial fibrillarion.  Low voltage, precordial leads  RSR' in V1 or V2, probably normal variant Confirmed by Samar Venneman  MD, Der Gagliano  [phone]) on 02/09/2015 1:10:00 PM      MDM   Final diagnoses:  Chronic atrial fibrillation  Diarrhea  Leukocytosis  UTI (lower urinary tract infection)    Pt is a 81 y.o. female with Pmhx as above who presents with 1 week of constipation followed by 3-4 days of watery d/a, question with blood in stool today as well as fatigue, weakness.  Patient has known history of A. Fib/A flutter and has chosen to not be treated as patient family states that she felt poorly when she was on medication for treatment.  She uses no anticoagulants.  She's had no recent hospitalizations or recent antibiotics.  No sick contacts.  She denies abdominal pain, nausea, vomiting or urinary symptoms.  On exam, she is tachycardic in  A. Fib, she has lower abdominal tenderness with normal bowel sounds.  No rebound or guarding.  She has heme positive brown stool on rectal exam.  Initial C. difficile is negative.  However, white blood cell count is 31.8.  UA appears infected.  Suspect colitis.  CT abdomen pelvis is been ordered.  Patient be given Cipro and Flagyl.  1 L normal saline, also been ordered. Dr. Darl Householder will follow CT, suspect pt will meet admission requirements.      Ernestina Patches, MD 02/09/15 [phone]

## 2015-02-09 NOTE — ED Notes (Signed)
Pt complaint of diarrhea and rectal soreness post use of laxatives/constipation for 2 weeks.

## 2015-02-09 NOTE — ED Provider Notes (Signed)
Physical Exam  BP 129/85 mmHg  Pulse 118  Temp(Src) 97.6 F (36.4 C) (Oral)  Resp 18  SpO2 96%  Physical Exam  ED Course  Procedures  MDM Care assumed at sign out. Patient has constipation for several days then diarrhea. WBC 31, UA + UTI. Pending CT. CT showed rectal stricture, possible mass with constipation. Rectal exam showed rectal impaction. I performed digital disimpaction. Still in rapid afib so started cardizem drip. Consulted [facility] (Dr. Penelope Coop) to perform colonoscopy as she has never had colonoscopy in the past. Will admit.   CRITICAL CARE Performed by: Darl Householder, Jakaiden Fill   Total critical care time: 30 min   Critical care time was exclusive of separately billable procedures and treating other patients.  Critical care was necessary to treat or prevent imminent or life-threatening deterioration.  Critical care was time spent personally by me on the following activities: development of treatment plan with patient and/or surrogate as well as nursing, discussions with consultants, evaluation of patient's response to treatment, examination of patient, obtaining history from patient or surrogate, ordering and performing treatments and interventions, ordering and review of laboratory studies, ordering and review of radiographic studies, pulse oximetry and re-evaluation of patient's condition.  Digital Disimpaction I numbed her rectum up with urojet. Digital disimpaction performed. No complications.   Results for orders placed or performed during the hospital encounter of 02/09/15  Clostridium Difficile by PCR (not at [facility])  Result Value Ref Range   C difficile by pcr NEGATIVE NEGATIVE  Lipase, blood  Result Value Ref Range   Lipase 23 22 - 51 U/L  Comprehensive metabolic panel  Result Value Ref Range   Sodium 136 135 - 145 mmol/L   Potassium 4.3 3.5 - 5.1 mmol/L   Chloride 107 101 - 111 mmol/L   CO2 21 (L) 22 - 32 mmol/L   Glucose, Bld 110 (H) 65 - 99 mg/dL   BUN 35 (H) 6 -  20 mg/dL   Creatinine, Ser 0.96 0.44 - 1.00 mg/dL   Calcium 8.9 8.9 - 10.3 mg/dL   Total Protein 7.2 6.5 - 8.1 g/dL   Albumin 3.8 3.5 - 5.0 g/dL   AST 27 15 - 41 U/L   ALT 13 (L) 14 - 54 U/L   Alkaline Phosphatase 67 38 - 126 U/L   Total Bilirubin 0.8 0.3 - 1.2 mg/dL   GFR calc non Af Amer 51 (L) >60 mL/min   GFR calc Af Amer 59 (L) >60 mL/min   Anion gap 8 5 - 15  CBC  Result Value Ref Range   WBC 31.8 (H) 4.0 - 10.5 K/uL   RBC 5.12 (H) 3.87 - 5.11 MIL/uL   Hemoglobin 12.6 12.0 - 15.0 g/dL   HCT 40.1 36.0 - 46.0 %   MCV 78.3 78.0 - 100.0 fL   MCH 24.6 (L) 26.0 - 34.0 pg   MCHC 31.4 30.0 - 36.0 g/dL   RDW 19.3 (H) 11.5 - 15.5 %   Platelets 287 150 - 400 K/uL  Urinalysis, Routine w reflex microscopic (not at [facility])  Result Value Ref Range   Color, Urine YELLOW YELLOW   APPearance CLOUDY (A) CLEAR   Specific Gravity, Urine 1.024 1.005 - 1.030   pH 5.5 5.0 - 8.0   Glucose, UA NEGATIVE NEGATIVE mg/dL   Hgb urine dipstick LARGE (A) NEGATIVE   Bilirubin Urine NEGATIVE NEGATIVE   Ketones, ur 15 (A) NEGATIVE mg/dL   Protein, ur 30 (A) NEGATIVE mg/dL   Urobilinogen, UA  0.2 0.0 - 1.0 mg/dL   Nitrite POSITIVE (A) NEGATIVE   Leukocytes, UA MODERATE (A) NEGATIVE  Urine microscopic-add on  Result Value Ref Range   WBC, UA 21-50 <3 WBC/hpf   RBC / HPF 3-6 <3 RBC/hpf   Bacteria, UA MANY (A) RARE  POC occult blood, ED Provider will collect  Result Value Ref Range   Fecal Occult Bld POSITIVE (A) NEGATIVE   Ct Abdomen Pelvis W Contrast  02/09/2015   CLINICAL DATA:  81 year old female with a history of constipation for 8 days.  EXAM: CT ABDOMEN AND PELVIS WITH CONTRAST  TECHNIQUE: Multidetector CT imaging of the abdomen and pelvis was performed using the standard protocol following bolus administration of intravenous contrast.  CONTRAST:  79mL OMNIPAQUE IOHEXOL 300 MG/ML  SOLN  COMPARISON:  None.  FINDINGS: Lower chest:  Unremarkable appearance of the soft tissues of the chest wall.  Heart  size within normal limits.  No pericardial fluid/thickening.  No lower mediastinal adenopathy.  Unremarkable appearance of the distal esophagus.  No hiatal hernia.  No confluent airspace disease, pleural fluid, or pneumothorax within visualized lung. Chronic changes of the lung bases.  Abdomen/pelvis:  Unremarkable appearance of liver and spleen.  Unremarkable appearance of bilateral adrenal glands.  No peripancreatic or pericholecystic fluid or inflammatory changes.  No radio-opaque gallstones.  No intrahepatic or extrahepatic biliary ductal dilatation.  No intra-peritoneal free air or significant free-fluid.  Very large stool burden with formed stool throughout the length of the colon. Formed stool involves the rectum. There is a narrowed segment of rectum. Hyper enhancement of the mucosa of the rectal wall with inflammatory changes and the meso rectum. No evidence of focal fluid collection or abscess. Thickening of the fascia planes within the anatomic pelvis.  Formed stool/fecalization of the distal small bowel.  No free air or significant free fluid within the abdomen.  Right Kidney/Ureter:  No hydronephrosis. No nephrolithiasis. No perinephric stranding. Unremarkable course of the right ureter.  Left Kidney/Ureter:  No hydronephrosis. No nephrolithiasis. No perinephric stranding.  Unremarkable course of the left ureter.  Unremarkable appearance of the urinary bladder.  Surgical changes of hysterectomy.  No significant vascular calcification. No aneurysm or periaortic fluid identified.  Musculoskeletal:  No displaced fracture identified.  Advanced degenerative changes of the visualized spine. Compression fracture of L2 and L4 of indeterminate age. Each of these levels demonstrates approximately 30% height loss.  Osteopenia.  No displaced fracture identified.  IMPRESSION: Evidence of stercoral colitis of the rectum, with very large formed stool burden of the rectum and the entire length of the colon, with  redundancy of the sigmoid colon. Recommend correlation with a history of obstipation/constipation. No evidence of perforation or abscess.  There is a narrowed segment/stricture of the rectum, just above the anorectal junction. Correlation with colonoscopy is recommended to rule out either a benign or malignant stricture.  Signed,  Dulcy Fanny. Earleen Newport, DO  [facility]  [facility]   Electronically Signed   By: Corrie Mckusick D.O.   On: 02/09/2015 16:06      Wandra Arthurs, MD 02/09/15 [phone]

## 2015-02-09 NOTE — ED Notes (Signed)
Pt aware need of urine sample; pt states unable to provide at this time.

## 2015-02-09 NOTE — H&P (Signed)
[facility] History and Physical  [name] LFY:[id number] DOB: Jan 07, 1923 DOA: 02/09/2015  Referring physician: ED PCP: [name], MD  Specialists: Gastroenterology/Cardiology/might call Oncology   81 y/o ? known A.fib Chad2Vasc2 ~5 not on AC nor Rate controlling agent, chronic myelocytic leukemia previously seen by 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 She presented to the emergency room with apparent one week constipation and was found to be in rapid A. fib with lower abdominal tenderness heme-positive brown stool on rectal and C. difficile was negative UA performed  Patient tells me that she was told that she had a blood disorder and on reading the package insert for anticoagulation was told that there was a risk of anemia and hence she did not one to take any medications at that time  Relatives relate that patient also has had a 6 month period of time wherein she has had slight slowing of mentation and increasing forgetfulness  - Dysuria, - frequency, - cough, - sputum, - lower extremity edema, - chest pain, - fever, - Chills, - falls - Rash, - headache  CT abdomen pelvis was performed showing very large stool burden throughout length: With hyperenhancement of the mucosa as well as narrowed segments/stricturing of the rectum just above the anorectal junction concerning for benign stricture/malignant stricture  EKG performed showed atrial fibrillation with rapid ventricular response, QRS axis 45, appears to be 4-15/flutter interspersed, QTC 477   Past Medical History  Diagnosis Date  . Atrial fib/flutter, transient     Coumadin; status post TEE guided cardioversion 6/12  . Interstitial lung disease 12/17/10    CT scan  of chest, showed  old compression fracture T6.  . Microcytic anemia     Iron deficient  . Thrombocytosis   . Hyponatremia   . history of Pneumonia 12/11/10  . Chronic diastolic heart failure     Echo 5/12: EF 60-65%, mild MR, mild BAE  . Chronic myelocytic leukemia     Dr. Ralene Ok  . Acute neutrophilic dermatosis     Sweet syndrome-prednisone therapy; [facility] dermatology   Past Surgical History  Procedure Laterality Date  . Hysterectomy      PARTIAL    Social History:  History   Social History Narrative   Prior used to be music and Conservation officer, nature   Independent of ADLs and IADLs   Last control 2014-has poor vision so stopped   Next of kin is his niece       No Known Allergies  Family History  Problem Relation Age of Onset  . Heart disease Mother 75  . Heart attack Father 38    died of heart attack.  . Leukemia    . Cancer - Lung      Prior to Admission medications   Medication Sig Start Date End Date Taking? Authorizing Provider  Sennosides (EX-LAX PO) Take 2 tablets by mouth daily as needed (constipation).   Yes Historical Provider, MD   Physical Exam: Filed Vitals:   02/09/15 1317 02/09/15 1612 02/09/15 1715 02/09/15 1734  BP: 124/73 129/85 123/81   Pulse: 131 118 104 114  Temp:      TempSrc:      Resp: 20 18 20 20   SpO2: 96% 96% 96% 96%    EOMI ncat pallor no icterus Mild JVD elevation No  LAN/thyromegally Chest clinically clear no added sound S1-S2 regularly irregular rhythm Abdomen soft nontender nondistended no rebound or guarding Neurologically intact   Labs on Admission:  Basic Metabolic Panel:  Recent Labs Lab 02/09/15 1220  NA 136  K 4.3  CL 107  CO2 21*  GLUCOSE 110*  BUN 35*  CREATININE 0.96  CALCIUM 8.9   Liver Function Tests:  Recent Labs Lab 02/09/15 1220  AST 27  ALT 13*  ALKPHOS 67  BILITOT 0.8  PROT 7.2  ALBUMIN 3.8    Recent Labs Lab 02/09/15 1220  LIPASE 23   No results for input(s): AMMONIA in the last 168  hours. CBC:  Recent Labs Lab 02/09/15 1220  WBC 31.8*  HGB 12.6  HCT 40.1  MCV 78.3  PLT 287   Cardiac Enzymes: No results for input(s): CKTOTAL, CKMB, CKMBINDEX, TROPONINI in the last 168 hours.  BNP (last 3 results) No results for input(s): BNP in the last 8760 hours.  ProBNP (last 3 results) No results for input(s): PROBNP in the last 8760 hours.  CBG: No results for input(s): GLUCAP in the last 168 hours.  Radiological Exams on Admission: Ct Abdomen Pelvis W Contrast  02/09/2015   CLINICAL DATA:  81 year old female with a history of constipation for 8 days.  EXAM: CT ABDOMEN AND PELVIS WITH CONTRAST  TECHNIQUE: Multidetector CT imaging of the abdomen and pelvis was performed using the standard protocol following bolus administration of intravenous contrast.  CONTRAST:  11mL OMNIPAQUE IOHEXOL 300 MG/ML  SOLN  COMPARISON:  None.  FINDINGS: Lower chest:  Unremarkable appearance of the soft tissues of the chest wall.  Heart size within normal limits.  No pericardial fluid/thickening.  No lower mediastinal adenopathy.  Unremarkable appearance of the distal esophagus.  No hiatal hernia.  No confluent airspace disease, pleural fluid, or pneumothorax within visualized lung. Chronic changes of the lung bases.  Abdomen/pelvis:  Unremarkable appearance of liver and spleen.  Unremarkable appearance of bilateral adrenal glands.  No peripancreatic or pericholecystic fluid or inflammatory changes.  No radio-opaque gallstones.  No intrahepatic or extrahepatic biliary ductal dilatation.  No intra-peritoneal free air or significant free-fluid.  Very large stool burden with formed stool throughout the length of the colon. Formed stool involves the rectum. There is a narrowed segment of rectum. Hyper enhancement of the mucosa of the rectal wall with inflammatory changes and the meso rectum. No evidence of focal fluid collection or abscess. Thickening of the fascia planes within the anatomic pelvis.  Formed  stool/fecalization of the distal small bowel.  No free air or significant free fluid within the abdomen.  Right Kidney/Ureter:  No hydronephrosis. No nephrolithiasis. No perinephric stranding. Unremarkable course of the right ureter.  Left Kidney/Ureter:  No hydronephrosis. No nephrolithiasis. No perinephric stranding.  Unremarkable course of the left ureter.  Unremarkable appearance of the urinary bladder.  Surgical changes of hysterectomy.  No significant vascular calcification. No aneurysm or periaortic fluid identified.  Musculoskeletal:  No displaced fracture identified.  Advanced degenerative changes of the visualized spine. Compression fracture of L2 and L4 of indeterminate age. Each of these levels demonstrates approximately 30% height loss.  Osteopenia.  No displaced fracture identified.  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 Recommend correlation with a history of obstipation/constipation. No evidence of perforation or abscess.  There is a narrowed segment/stricture of the rectum, just  above the anorectal junction. Correlation with colonoscopy is recommended to rule out either a benign or malignant stricture.  Signed,  Dulcy Fanny. Earleen Newport, DO  [facility]  [facility]   Electronically Signed   By: Corrie Mckusick D.O.   On: 02/09/2015 16:06    EKG: Independently reviewed. See above   Assessment/Plan Principal Problem:   Atrial fibrillation -Mali score above 5 -Cardizem drip at 5 and titrate to heart rate of less than 110-do not convert to by mouth -Cardiology consulted to discuss options for anticoagulation and rate control. -Echocardiogram ordered and pending. -Started IV heparin GTT-monitor counts carefully given history of chronic myelocytic leukemia -I suspect some of her memory loss might be microvascular  insults secondary to A. fib and she should have a clear discussion about benefits of anticoagulation before discharge from cardiology    Acute neutrophilic dermatosis/Sweet syndrome -Stable at present time -RA factor elevated in the past 11/2010 might be in keeping with this    Chronic myelocytic leukemia with potential leukemoid reaction  -Add on platelets as well as lap score -White count 30,000 and likely in keeping with myelocytic leukemia and not infection -Discontinue Cipro/Flagyl as no specific indication I can see -Rpt labs a.m. -PLT and Hemoglobin are fine currently -May benefit from further discussion with oncology in a.m. and    Chronic diastolic heart failure -NO Echo available in Epic system -Will need updating the same and evaluation for clot -Cardiology to follow    Constipation - functional -unlikely infectious process -Add Lactulose/sorbitol scheduled till regular stools -GI has been consulted by the emergency room regarding stricture? On colonoscopy. -Never has had colonoscopy so may benefit from (high functioning.    Microcytic anemia -h/o of Sweet syndrome as well as CML -might neeed Oncology input as there is overlap between AML and Sweey syndrome    Asymptomatic bacteriuria -d/c Abx after ED dose ass no clinical parameter of infecitoun and patient not sympotmatic  ILD on prior steroids -Monitor   Time spent: [place], [facility] Pager [phone]  On discussion with niece and patient and sister at the bedside-wishes to be full code Admitted to step down at least overnight  If 7PM-7AM, please contact night-coverage [email or web address] Password [facility] 02/09/2015, 5:53 PM

## 2015-02-09 NOTE — Care Management Note (Deleted)
Case Management Note  Patient Details  Name: [name] MRN: [id number] Date of Birth: November 12, 1922  Subjective/Objective:       81y.o. F admitted to SDU with UTI and rapid AFib. Unsure of living arrangements pta.              Action/Plan: No clear CM needs at present.    Expected Discharge Date:                  Expected Discharge Plan:     In-House Referral:     Discharge planning Services     Post Acute Care Choice:    Choice offered to:     DME Arranged:    DME Agency:     HH Arranged:    [place] Agency:     Status of Service:     Medicare Important Message Given:    Date Medicare IM Given:    Medicare IM give by:    Date Additional Medicare IM Given:    Additional Medicare Important Message give by:     If discussed at [place] of Stay Meetings, dates discussed:    Additional Comments:  Delrae Sawyers, RN 02/09/2015, 5:20 PM

## 2015-02-09 NOTE — ED Notes (Signed)
Disimpaction in process per Darl Householder; will administer remaining infusions with completion.

## 2015-02-09 NOTE — Progress Notes (Signed)
ANTICOAGULATION CONSULT NOTE - Initial Consult  Pharmacy Consult for heparin Indication: atrial fibrillation  No Known Allergies  Patient Measurements: Height: 5\' 4"  (162.6 cm) Weight: 112 lb 10.5 oz (51.1 kg) IBW/kg (Calculated) : 54.7 Heparin Dosing Weight: 51 kg  Vital Signs: Temp: 97.4 F (36.3 C) (07/20 1800) Temp Source: Oral (07/20 1150) BP: 113/59 mmHg (07/20 1800) Pulse Rate: 143 (07/20 1800)  Labs:  Recent Labs  02/09/15 1220  HGB 12.6  HCT 40.1  PLT 287  CREATININE 0.96    Estimated Creatinine Clearance: 32 mL/min (by C-G formula based on Cr of 0.96).   Medical History: Past Medical History  Diagnosis Date  . Atrial fib/flutter, transient     Coumadin; status post TEE guided cardioversion 6/12  . Interstitial lung disease 12/17/10    CT scan  of chest, showed old compression fracture T6.  . Microcytic anemia     Iron deficient  . Thrombocytosis   . Hyponatremia   . history of Pneumonia 12/11/10  . Chronic diastolic heart failure     Echo 5/12: EF 60-65%, mild MR, mild BAE  . Chronic myelocytic leukemia     Dr. Ralene Ok  . Acute neutrophilic dermatosis     Sweet syndrome-prednisone therapy; [facility] dermatology    Medications:  Prescriptions prior to admission  Medication Sig Dispense Refill Last Dose  . Sennosides (EX-LAX PO) Take 2 tablets by mouth daily as needed (constipation).   02/08/2015 at Unknown time    Assessment: 81yo F c/o constipation and lower abdominal tenderness found to be in A.fib w/ RVR. Notably had A.fib in 2012, for which she underwent cardioversion. She was not on any anticoagulation prior to admission. Pharmacy is asked to start heparin infusion. CBC is wnl.  Goal of Therapy:  Heparin level 0.3-0.7 units/ml Monitor platelets by anticoagulation protocol: Yes   Plan:  Draw baseline aPTT and PT/INR. Give heparin 2550units IV bolus then start infusion at 750units/hr. Check heparin level in 8hrs and daily  thereafter. Check CBC q24h while on heparin. F/u daily.  Romeo Rabon, PharmD, pager [phone]. 02/09/2015,6:19 PM.

## 2015-02-09 NOTE — Progress Notes (Deleted)
[facility] History and Physical  [name] YIF:[id number] DOB: 05-06-1923 DOA: 02/09/2015  Referring physician: ED PCP: [name], MD  Specialists: Gastroenterology/Cardiology/might call Oncology   81 y/o ? known A.fib Chad2Vasc2 ~5 not on AC nor Rate controlling agent, chronic myelocytic leukemia previously seen by 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 She presented to the emergency room with apparent one week constipation and was found to be in rapid A. fib with lower abdominal tenderness heme-positive brown stool on rectal and C. difficile was negative UA performed  Patient tells me that she was told that she had a blood disorder and on reading the package insert for anticoagulation was told that there was a risk of anemia and hence she did not one to take any medications at that time  Relatives relate that patient also has had a 6 month period of time wherein she has had slight slowing of mentation and increasing forgetfulness  - Dysuria, - frequency, - cough, - sputum, - lower extremity edema, - chest pain, - fever, - Chills, - falls - Rash, - headache  CT abdomen pelvis was performed showing very large stool burden throughout length: With hyperenhancement of the mucosa as well as narrowed segments/stricturing of the rectum just above the anorectal junction concerning for benign stricture/malignant stricture  EKG performed showed atrial fibrillation with rapid ventricular response, QRS axis 45, appears to be 4-15/flutter interspersed, QTC 477   Past Medical History  Diagnosis Date  . Atrial fib/flutter, transient     Coumadin; status post TEE guided cardioversion 6/12  . Interstitial lung disease 12/17/10    CT scan  of chest, showed  old compression fracture T6.  . Microcytic anemia     Iron deficient  . Thrombocytosis   . Hyponatremia   . history of Pneumonia 12/11/10  . Chronic diastolic heart failure     Echo 5/12: EF 60-65%, mild MR, mild BAE  . Chronic myelocytic leukemia     Dr. Ralene Ok  . Acute neutrophilic dermatosis     Sweet syndrome-prednisone therapy; [facility] dermatology   Past Surgical History  Procedure Laterality Date  . Hysterectomy      PARTIAL    Social History:  History   Social History Narrative   Prior used to be music and Conservation officer, nature   Independent of ADLs and IADLs   Last control 2014-has poor vision so stopped   Next of kin is his niece       No Known Allergies  Family History  Problem Relation Age of Onset  . Heart disease Mother 30  . Heart attack Father 4    died of heart attack.  . Leukemia    . Cancer - Lung      Prior to Admission medications   Medication Sig Start Date End Date Taking? Authorizing Provider  Sennosides (EX-LAX PO) Take 2 tablets by mouth daily as needed (constipation).   Yes Historical Provider, MD   Physical Exam: Filed Vitals:   02/09/15 1150 02/09/15 1317 02/09/15 1612 02/09/15 1715  BP: 133/93 124/73 129/85 123/81  Pulse: 126 131 118 104  Temp: 97.6 F (36.4 C)     TempSrc: Oral     Resp: 18 20 18 20   SpO2: 96% 96% 96% 96%    EOMI ncat pallor no icterus Mild  JVD elevation No LAN/thyromegally Chest clinically clear no added sound S1-S2 regularly irregular rhythm Abdomen soft nontender nondistended no rebound or guarding Neurologically intact   Labs on Admission:  Basic Metabolic Panel:  Recent Labs Lab 02/09/15 1220  NA 136  K 4.3  CL 107  CO2 21*  GLUCOSE 110*  BUN 35*  CREATININE 0.96  CALCIUM 8.9   Liver Function Tests:  Recent Labs Lab 02/09/15 1220  AST 27  ALT 13*  ALKPHOS 67  BILITOT 0.8  PROT 7.2  ALBUMIN 3.8    Recent Labs Lab 02/09/15 1220  LIPASE 23   No results for input(s):  AMMONIA in the last 168 hours. CBC:  Recent Labs Lab 02/09/15 1220  WBC 31.8*  HGB 12.6  HCT 40.1  MCV 78.3  PLT 287   Cardiac Enzymes: No results for input(s): CKTOTAL, CKMB, CKMBINDEX, TROPONINI in the last 168 hours.  BNP (last 3 results) No results for input(s): BNP in the last 8760 hours.  ProBNP (last 3 results) No results for input(s): PROBNP in the last 8760 hours.  CBG: No results for input(s): GLUCAP in the last 168 hours.  Radiological Exams on Admission: Ct Abdomen Pelvis W Contrast  02/09/2015   CLINICAL DATA:  81 year old female with a history of constipation for 8 days.  EXAM: CT ABDOMEN AND PELVIS WITH CONTRAST  TECHNIQUE: Multidetector CT imaging of the abdomen and pelvis was performed using the standard protocol following bolus administration of intravenous contrast.  CONTRAST:  90mL OMNIPAQUE IOHEXOL 300 MG/ML  SOLN  COMPARISON:  None.  FINDINGS: Lower chest:  Unremarkable appearance of the soft tissues of the chest wall.  Heart size within normal limits.  No pericardial fluid/thickening.  No lower mediastinal adenopathy.  Unremarkable appearance of the distal esophagus.  No hiatal hernia.  No confluent airspace disease, pleural fluid, or pneumothorax within visualized lung. Chronic changes of the lung bases.  Abdomen/pelvis:  Unremarkable appearance of liver and spleen.  Unremarkable appearance of bilateral adrenal glands.  No peripancreatic or pericholecystic fluid or inflammatory changes.  No radio-opaque gallstones.  No intrahepatic or extrahepatic biliary ductal dilatation.  No intra-peritoneal free air or significant free-fluid.  Very large stool burden with formed stool throughout the length of the colon. Formed stool involves the rectum. There is a narrowed segment of rectum. Hyper enhancement of the mucosa of the rectal wall with inflammatory changes and the meso rectum. No evidence of focal fluid collection or abscess. Thickening of the fascia planes within the  anatomic pelvis.  Formed stool/fecalization of the distal small bowel.  No free air or significant free fluid within the abdomen.  Right Kidney/Ureter:  No hydronephrosis. No nephrolithiasis. No perinephric stranding. Unremarkable course of the right ureter.  Left Kidney/Ureter:  No hydronephrosis. No nephrolithiasis. No perinephric stranding.  Unremarkable course of the left ureter.  Unremarkable appearance of the urinary bladder.  Surgical changes of hysterectomy.  No significant vascular calcification. No aneurysm or periaortic fluid identified.  Musculoskeletal:  No displaced fracture identified.  Advanced degenerative changes of the visualized spine. Compression fracture of L2 and L4 of indeterminate age. Each of these levels demonstrates approximately 30% height loss.  Osteopenia.  No displaced fracture identified.  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 Recommend correlation with a history of obstipation/constipation. No evidence of perforation or abscess.  There is a narrowed segment/stricture of  the rectum, just above the anorectal junction. Correlation with colonoscopy is recommended to rule out either a benign or malignant stricture.  Signed,  Dulcy Fanny. Earleen Newport, DO  [facility]  [facility]   Electronically Signed   By: Corrie Mckusick D.O.   On: 02/09/2015 16:06    EKG: Independently reviewed. See above   Assessment/Plan Principal Problem:   Atrial fibrillation -Mali score above 5 -Cardizem drip at 5 and titrate to heart rate of less than 110-do not convert to by mouth -Cardiology consulted to discuss options for anticoagulation and rate control. -Echocardiogram ordered and pending. -Started IV heparin GTT-monitor counts carefully given history of chronic myelocytic leukemia -I suspect some of her memory loss  might be microvascular insults secondary to A. fib and she should have a clear discussion about benefits of anticoagulation before discharge from cardiology    Acute neutrophilic dermatosis/Sweet syndrome -Stable at present time -RA factor elevated in the past 11/2010 might be in keeping with this    Chronic myelocytic leukemia with potential leukemoid reaction  -Add on platelets as well as lap score -White count 30,000 and likely in keeping with myelocytic leukemia and not infection -Discontinue Cipro/Flagyl as no specific indication I can see -Rpt labs a.m. -PLT and Hemoglobin are fine currently -May benefit from further discussion with oncology in a.m. and    Chronic diastolic heart failure -NO Echo available in Epic system -Will need updating the same and evaluation for clot -Cardiology to follow    Constipation - functional -unlikely infectious process -Add Lactulose/sorbitol scheduled till regular stools -GI has been consulted by the emergency room regarding stricture? On colonoscopy. -Never has had colonoscopy so may benefit from (high functioning.    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 [place] [facility] Pager [phone]  On discussion with niece and patient and sister at the bedside-wishes to be full code Admitted to step down at least overnight  If 7PM-7AM, please contact night-coverage [email or web address] Password [email or web address] 02/09/2015, 5:25 PM

## 2015-02-09 NOTE — Progress Notes (Signed)
Initial Utilization Review and preliminary chart review completed. Unsure of pts living arrangements pta. Will defer to CM on admitting floor for f/u and discharge needs.

## 2015-02-09 NOTE — Care Management Note (Signed)
Case Management Note  Patient Details  Name: [name] MRN: [id number] Date of Birth: Jan 05, 1923  Subjective/Objective:                    Action/Plan:   Expected Discharge Date:                  Expected Discharge Plan:     In-House Referral:     Discharge planning Services     Post Acute Care Choice:    Choice offered to:     DME Arranged:    DME Agency:     HH Arranged:    [place] Agency:     Status of Service:     Medicare Important Message Given:    Date Medicare IM Given:    Medicare IM give by:    Date Additional Medicare IM Given:    Additional Medicare Important Message give by:     If discussed at [place] of Stay Meetings, dates discussed:    Additional Comments:  Delrae Sawyers, RN 02/09/2015, 5:20 PM

## 2015-02-10 ENCOUNTER — Inpatient Hospital Stay (HOSPITAL_COMMUNITY): Payer: Medicare Other

## 2015-02-10 DIAGNOSIS — I5032 Chronic diastolic (congestive) heart failure: Secondary | ICD-10-CM

## 2015-02-10 DIAGNOSIS — I482 Chronic atrial fibrillation, unspecified: Secondary | ICD-10-CM | POA: Diagnosis present

## 2015-02-10 LAB — CBC
HEMATOCRIT: 36.3 % (ref 36.0–46.0)
HEMOGLOBIN: 11 g/dL — AB (ref 12.0–15.0)
MCH: 23.7 pg — AB (ref 26.0–34.0)
MCHC: 30.3 g/dL (ref 30.0–36.0)
MCV: 78.1 fL (ref 78.0–100.0)
Platelets: 211 10*3/uL (ref 150–400)
RBC: 4.65 MIL/uL (ref 3.87–5.11)
RDW: 19.3 % — ABNORMAL HIGH (ref 11.5–15.5)
WBC: 23 10*3/uL — ABNORMAL HIGH (ref 4.0–10.5)

## 2015-02-10 LAB — BASIC METABOLIC PANEL
ANION GAP: 8 (ref 5–15)
BUN: 21 mg/dL — AB (ref 6–20)
CHLORIDE: 108 mmol/L (ref 101–111)
CO2: 19 mmol/L — ABNORMAL LOW (ref 22–32)
CREATININE: 0.78 mg/dL (ref 0.44–1.00)
Calcium: 8.2 mg/dL — ABNORMAL LOW (ref 8.9–10.3)
GFR calc Af Amer: >60 mL/min (ref >60)
GFR calc non Af Amer: >60 mL/min (ref >60)
GLUCOSE: 92 mg/dL (ref 65–99)
POTASSIUM: 4.3 mmol/L (ref 3.5–5.1)
Sodium: 135 mmol/L (ref 135–145)

## 2015-02-10 LAB — HEPARIN LEVEL (UNFRACTIONATED)
HEPARIN UNFRACTIONATED: 0.16 [IU]/mL — AB (ref 0.30–0.70)
Heparin Unfractionated: 0.41 IU/mL (ref 0.30–0.70)

## 2015-02-10 MED ORDER — DIPHENHYDRAMINE HCL 25 MG PO CAPS
25.0000 mg | ORAL_CAPSULE | Freq: Three times a day (TID) | ORAL | Status: DC | PRN
  Administered 2015-02-10: 25 mg via ORAL
  Filled 2015-02-10 (×2): qty 1

## 2015-02-10 MED ORDER — DIGOXIN 125 MCG PO TABS
0.1250 mg | ORAL_TABLET | Freq: Every day | ORAL | Status: DC
  Administered 2015-02-11 – 2015-02-15 (×5): 0.125 mg via ORAL
  Filled 2015-02-10 (×5): qty 1

## 2015-02-10 MED ORDER — DIPHENHYDRAMINE HCL 25 MG PO CAPS
25.0000 mg | ORAL_CAPSULE | Freq: Four times a day (QID) | ORAL | Status: DC | PRN
  Administered 2015-02-10 (×2): 25 mg via ORAL
  Filled 2015-02-10: qty 1

## 2015-02-10 MED ORDER — CAMPHOR-MENTHOL 0.5-0.5 % EX LOTN
TOPICAL_LOTION | CUTANEOUS | Status: DC | PRN
  Administered 2015-02-10: 08:00:00 via TOPICAL
  Filled 2015-02-10: qty 222

## 2015-02-10 MED ORDER — ENSURE ENLIVE PO LIQD
237.0000 mL | Freq: Every morning | ORAL | Status: DC
  Administered 2015-02-10 – 2015-02-15 (×4): 237 mL via ORAL

## 2015-02-10 MED ORDER — DIGOXIN 0.25 MG/ML IJ SOLN
0.2500 mg | Freq: Once | INTRAMUSCULAR | Status: AC
  Administered 2015-02-10: 0.25 mg via INTRAVENOUS
  Filled 2015-02-10 (×2): qty 1

## 2015-02-10 MED ORDER — DIGOXIN 0.25 MG/ML IJ SOLN
0.2500 mg | Freq: Once | INTRAMUSCULAR | Status: AC
  Administered 2015-02-10: 0.25 mg via INTRAVENOUS
  Filled 2015-02-10: qty 1

## 2015-02-10 MED ORDER — ASPIRIN 81 MG PO CHEW
81.0000 mg | CHEWABLE_TABLET | Freq: Every day | ORAL | Status: DC
  Administered 2015-02-10 – 2015-02-18 (×9): 81 mg via ORAL
  Filled 2015-02-10 (×10): qty 1

## 2015-02-10 MED ORDER — HEPARIN (PORCINE) IN NACL 100-0.45 UNIT/ML-% IJ SOLN
900.0000 [IU]/h | INTRAMUSCULAR | Status: DC
  Administered 2015-02-10: 900 [IU]/h via INTRAVENOUS
  Filled 2015-02-10: qty 250

## 2015-02-10 MED ORDER — PEG 3350-KCL-NA BICARB-NACL 420 G PO SOLR
4000.0000 mL | Freq: Once | ORAL | Status: AC
  Administered 2015-02-10: 4000 mL via ORAL
  Filled 2015-02-10: qty 4000

## 2015-02-10 MED ORDER — BISACODYL 10 MG RE SUPP
10.0000 mg | Freq: Two times a day (BID) | RECTAL | Status: AC
  Administered 2015-02-10 (×2): 10 mg via RECTAL
  Filled 2015-02-10 (×2): qty 1

## 2015-02-10 NOTE — Progress Notes (Signed)
Pt having sudden severe uncontrollable itching of her legs. MD notified prn benedryl and SARNA ordered. Pt sheets changed to hypoallergenic. Will continue to monitor.

## 2015-02-10 NOTE — Progress Notes (Signed)
[facility] PROGRESS NOTE  Melanye Hiraldo HGD:[id number] DOB: 1922-10-03 DOA: 02/09/2015 PCP: [name], MD  HPI/Subjective: 81 year old female with known a. Fib , CML, sweet dyndrome, interstitial lung disease, diastolic heart failure, and microcytic anemia presented to the ED with constipation x 1 week with lower abdominal tenderness and was found to be in afib with RVR. She was admitted to step down for atrial fibrillation and constipation. She was not on a rate controlling agent or anticoagulant. She had a prior admission for afib 5/18-5/21/16 and was cardioverted at that time to restore normal sinus rhythm. She had heme + brown stool on rectal exam and c. Diff negative. Her relatives have indicated that she has become increasingly forgetful with a slight slowing of mentation over the past 6 months.   Today, she reports feeling better than yesterday though she has persistent itching that woke her up this morning. It initially began in her legs and has now spread to her abdomen and arms. She passed some loose stool last night after Sorbital, no blood in stool. She is not experiencing any nausea, vomiting, chest pain, abdominal pain or changes in vision. She did not eat dinner last night and has minimal appetite, but will try to eat breakfast this morning.     Assessment/Plan: 1. Atrial fibrillation -Chad2vasc2 score is 5 - she has not previously been on a rate controlling agent or anticoagulant - 1 prior admission where she had to be cardioverted - EKG on 02/10/15 demonstrated atrial flutter with variable conduction and on telemetry patient has predominately been in atrial fibrillation. -Cardizem drip at 5 and titrate to heatrt rate of less than 110-do not convert to PO until decisions made and discussion abou medical vs. CV options are discussed with patient and family by Cardiology Marlana Salvage has some fears about Cardizem] -Echo pending -Started IV heparin GTT- monitor counts carefully given  history of chronic myelocytic leukemia - Some memory loss may be due to microvascular insults secondary to A.fib -Cardiology consulted appreciated - Dig and ASA per cardiology  Acute neutrophilic dermatosis/Sweet syndrome -Stable at present time -RA factor elevated in the past 11/2010 might be in keeping with this - Itching/rash with skin dryness - Benadryl and Sarna lotion PRN  Chronic myelocytic leukemia with potential leukemoid reaction  -Add on platelets smear as well as lap score -WBC decreased to 23 from 31.8 -Discontinue Cipro/Flagyl as no specific indication I can see -PLT and Hemoglobin are fine currently -?Heme/onc will be consulted dependant on LAP and Platelet smear  Chronic diastolic heart failure - Currently stable, no signs of fluid overload - No previous echo availabe - Echo pending for evaluation of clots and heart function - Cardiology to follow   Constipation - functional - Passed loose stool last night after drinking Sorbitol, no blood in stool - Unlikely infectious process - Continue Lactulose/sorbitol scheduled till regular stools - GI has been consulted by the emergency room regarding stricture? - Patient has never had a colonoscopy -Appreciate GI input in advance  Microcytic anemia -h/o of Sweet syndrome as well as CML -might neeed Oncology input as there is overlap between AML and Sweey syndrome  Asymptomatic bacteriuria - No dysuria or fever - Discontinued antibiotic after ED dose, no signs of infection and patient is asymptomatic  Inadequate oral intake - Likely due to lethargy/confision per patient and family, patient also reports decreased appetite   - Consulted nutrition - Ensure per their recommendation, monitor daily weight   ILD on prior steroids -Monitor -Poor candidate  for Amiodarone for rate control   Code Status: Full Family Communication: Patient and daughter at bedside Disposition Plan: Remain inpatient     Consultants:  GI  Cardiology  Nutrition  Antibiotics:  Cipro  02/09/15 >> 02/09/15  Flagyl 02/09/15 >> 02/09/15   Objective: Filed Vitals:   02/10/15 0800  BP: 151/40  Pulse:   Temp: 97.8 F (36.6 C)  Resp: 28    Intake/Output Summary (Last 24 hours) at 02/10/15 0858 Last data filed at 02/10/15 0800  Gross per 24 hour  Intake 1132.05 ml  Output      0 ml  Net 1132.05 ml   Filed Weights   02/09/15 1800  Weight: 51.1 kg (112 lb 10.5 oz)    Exam:   General:  Patient is laying in bed in no distress, she appears comfortable and is pleasant, interacting appropriately.  Skin: Diffuse dry skin with obvious marks from patient scratching. Diffuse mild redness throughout abdomen with a small 1x1 cm lesion on her left hip.  Cardiovascular: Irregular rate, no m/r/g, no LE edema, DP pulses 2+ bilaterally   Respiratory: Clear to auscultation, no wheezing, normal respiratory effort  Abdomen: Bowel sounds present, soft, non-distended  Musculoskeletal: Grossly normal tone  Data Reviewed: Basic Metabolic Panel:  Recent Labs Lab 02/09/15 1220 02/09/15 1845 02/10/15 0340  NA 136  --  135  K 4.3  --  4.3  CL 107  --  108  CO2 21*  --  19*  GLUCOSE 110*  --  92  BUN 35*  --  21*  CREATININE 0.96  --  0.78  CALCIUM 8.9  --  8.2*  MG  --  1.8  --    Liver Function Tests:  Recent Labs Lab 02/09/15 1220  AST 27  ALT 13*  ALKPHOS 67  BILITOT 0.8  PROT 7.2  ALBUMIN 3.8    Recent Labs Lab 02/09/15 1220  LIPASE 23   CBC:  Recent Labs Lab 02/09/15 1220 02/10/15 0340  WBC 31.8* 23.0*  HGB 12.6 11.0*  HCT 40.1 36.3  MCV 78.3 78.1  PLT 287 211    Recent Results (from the past 240 hour(s))  Clostridium Difficile by PCR (not at [facility])     Status: None   Collection Time: 02/09/15  2:21 PM  Result Value Ref Range Status   C difficile by pcr NEGATIVE NEGATIVE Final  MRSA PCR Screening     Status: None   Collection Time: 02/09/15  5:55 PM  Result  Value Ref Range Status   MRSA by PCR NEGATIVE NEGATIVE Final    Comment:        The GeneXpert MRSA Assay (FDA approved for NASAL specimens only), is one component of a comprehensive MRSA colonization surveillance program. It is not intended to diagnose MRSA infection nor to guide or monitor treatment for MRSA infections.      Studies: Ct Abdomen Pelvis W Contrast  02/09/2015   CLINICAL DATA:  81 year old female with a history of constipation for 8 days.  EXAM: CT ABDOMEN AND PELVIS WITH CONTRAST  TECHNIQUE: Multidetector CT imaging of the abdomen and pelvis was performed using the standard protocol following bolus administration of intravenous contrast.  CONTRAST:  82mL OMNIPAQUE IOHEXOL 300 MG/ML  SOLN  COMPARISON:  None.  FINDINGS: Lower chest:  Unremarkable appearance of the soft tissues of the chest wall.  Heart size within normal limits.  No pericardial fluid/thickening.  No lower mediastinal adenopathy.  Unremarkable appearance of the distal esophagus.  No hiatal hernia.  No confluent airspace disease, pleural fluid, or pneumothorax within visualized lung. Chronic changes of the lung bases.  Abdomen/pelvis:  Unremarkable appearance of liver and spleen.  Unremarkable appearance of bilateral adrenal glands.  No peripancreatic or pericholecystic fluid or inflammatory changes.  No radio-opaque gallstones.  No intrahepatic or extrahepatic biliary ductal dilatation.  No intra-peritoneal free air or significant free-fluid.  Very large stool burden with formed stool throughout the length of the colon. Formed stool involves the rectum. There is a narrowed segment of rectum. Hyper enhancement of the mucosa of the rectal wall with inflammatory changes and the meso rectum. No evidence of focal fluid collection or abscess. Thickening of the fascia planes within the anatomic pelvis.  Formed stool/fecalization of the distal small bowel.  No free air or significant free fluid within the abdomen.  Right  Kidney/Ureter:  No hydronephrosis. No nephrolithiasis. No perinephric stranding. Unremarkable course of the right ureter.  Left Kidney/Ureter:  No hydronephrosis. No nephrolithiasis. No perinephric stranding.  Unremarkable course of the left ureter.  Unremarkable appearance of the urinary bladder.  Surgical changes of hysterectomy.  No significant vascular calcification. No aneurysm or periaortic fluid identified.  Musculoskeletal:  No displaced fracture identified.  Advanced degenerative changes of the visualized spine. Compression fracture of L2 and L4 of indeterminate age. Each of these levels demonstrates approximately 30% height loss.  Osteopenia.  No displaced fracture identified.  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 Recommend correlation with a history of obstipation/constipation. No evidence of perforation or abscess.  There is a narrowed segment/stricture of the rectum, just above the anorectal junction. Correlation with colonoscopy is recommended to rule out either a benign or malignant stricture.  Signed,  Dulcy Fanny. Earleen Newport, DO  [facility]  [facility]   Electronically Signed   By: Corrie Mckusick D.O.   On: 02/09/2015 16:06    Scheduled Meds: . aspirin  81 mg Oral Daily  . digoxin  0.25 mg Intravenous Once  . [START ON 02/11/2015] digoxin  0.125 mg Oral Daily  . sorbitol  30 mL Oral Daily   Continuous Infusions: . sodium chloride 75 mL/hr at 02/09/15 1805  . diltiazem (CARDIZEM) infusion 5 mg/hr (02/09/15 2200)  . heparin 750 Units/hr (02/09/15 2200)    Principal Problem:   Atrial fibrillation Active Problems:   Acute neutrophilic dermatosis   Chronic myelocytic leukemia   Chronic diastolic heart failure   UTI (lower urinary tract infection)   Constipation - functional   Microcytic anemia    Asymptomatic bacteriuria   New onset a-fib   Time spent: [place], PA-S Verneita Griffes, MD  [facility] Pager [phone]. If 7PM-7AM, please contact night-coverage at [email or web address], password [email or web address] 02/10/2015, 8:58 AM  LOS: 1 day

## 2015-02-10 NOTE — Progress Notes (Signed)
ANTICOAGULATION CONSULT NOTE - Follow Up Consult  Pharmacy Consult for Heparin  Indication: atrial fibrillation  No Known Allergies  Patient Measurements: Height: 5\' 4"  (162.6 cm) Weight: 112 lb 10.5 oz (51.1 kg) IBW/kg (Calculated) : 54.7 Heparin Dosing Weight: 51 kg  Vital Signs: Temp: 97.8 F (36.6 C) (07/21 0800) Temp Source: Oral (07/21 0800) BP: 151/40 mmHg (07/21 0800) Pulse Rate: 135 (07/21 0700)  Labs:  Recent Labs  02/09/15 1220 02/09/15 1845 02/10/15 0108 02/10/15 0340 02/10/15 0854  HGB 12.6  --   --  11.0*  --   HCT 40.1  --   --  36.3  --   PLT 287  --   --  211  --   APTT  --  32  --   --   --   LABPROT  --  15.4*  --   --   --   INR  --  1.21  --   --   --   HEPARINUNFRC  --   --  0.41  --  0.16*  CREATININE 0.96  --   --  0.78  --     Estimated Creatinine Clearance: 38.5 mL/min (by C-G formula based on Cr of 0.78).   Medications:  Scheduled:  . aspirin  81 mg Oral Daily  . bisacodyl  10 mg Rectal q12n4p  . [START ON 02/11/2015] digoxin  0.125 mg Oral Daily  . feeding supplement (ENSURE ENLIVE)  237 mL Oral q morning - 10a  . polyethylene glycol-electrolytes  4,000 mL Oral Once  . sorbitol  30 mL Oral Daily   Infusions:  . sodium chloride 75 mL/hr at 02/09/15 1805  . diltiazem (CARDIZEM) infusion 5 mg/hr (02/09/15 2200)  . heparin 750 Units/hr (02/09/15 2200)   PRN: acetaminophen, camphor-menthol, diphenhydrAMINE, ondansetron (ZOFRAN) IV  Assessment: 81yo F c/o constipation and lower abdominal tenderness found to be in A.fib w/ RVR. Notably had A.fib in 2012, for which she underwent cardioversion. She was not on any anticoagulation prior to admission. Pharmacy is asked to start heparin infusion.  Heparin level subtherapeutic (0.16) on 750 units/hr  Hgb slightly decreased to 11, Plts stable, hx CML  No bleeding reported per RN  Cardiology consulted management of atrial fibrillation and consideration of anticoagulation  therapy  Concurrent aspirin 81mg  noted  Goal of Therapy:  Heparin level 0.3-0.7 units/ml Monitor platelets by anticoagulation protocol: Yes   Plan:   Increase heparin to 900 units/hr  Recheck heparin level in 8 hrs  Daily heparin level and CBC  Peggyann Juba, PharmD, BCPS Pager: [phone] 02/10/2015,11:41 AM

## 2015-02-10 NOTE — Consult Note (Signed)
[facility] Consultation Note  Referring Provider:  Dr. Verneita Griffes [facility]) Primary Care Physician:  [name], MD Primary Gastroenterologist:  None  Reason for Consultation:  Constipation, obstipation, abnormal CT scan   HPI: [name] is a 81 y.o. female with multiple stable medical problems, very active lives alone and independent of all activities of daily living, presents to ED for complaints of constipation.  Patient with regular bowel movements until about two weeks ago.  At that time, she began having progressive difficult with constipation.  Nevertheless, no obvious abdominal distention, blood in stool, nausea, vomiting.  She has had gradual weight loss 15+ lbs, slowly over the past several years.  Upon ED evaluation, she was found to have atrial fibrillation with rapid ventricular response, necessitating admission to step-down until.  CT abdomen, which I have personally reviewed, showed large stool burden throughout colon, but especially in the rectum, where there was some presumed neighboring stercoral colitis as well as possible distal rectal stricture.  Patient denies ever having had a colonoscopy.  Constipation was refractory to several outpatient therapies, but she was disimpacted in ED with some success, and more success with some sorbitol.  No known family history of colon cancer or polyps.   Past Medical History  Diagnosis Date  . Atrial fib/flutter, transient     Coumadin; status post TEE guided cardioversion 6/12  . Interstitial lung disease 12/17/10    CT scan  of chest, showed old compression fracture T6.  . Microcytic anemia     Iron deficient  . Thrombocytosis   . Hyponatremia   . history of Pneumonia 12/11/10  . Chronic diastolic heart failure     Echo 5/12: EF 60-65%, mild MR, mild BAE  . Chronic myelocytic leukemia     Dr. Ralene Ok  . Acute neutrophilic dermatosis     Sweet syndrome-prednisone therapy; [facility] dermatology    Past Surgical  History  Procedure Laterality Date  . Hysterectomy      PARTIAL     Prior to Admission medications   Medication Sig Start Date End Date Taking? Authorizing Provider  Sennosides (EX-LAX PO) Take 2 tablets by mouth daily as needed (constipation).   Yes Historical Provider, MD    Current Facility-Administered Medications  Medication Dose Route Frequency Provider Last Rate Last Dose  . 0.9 %  sodium chloride infusion   Intravenous Continuous Nita Sells, MD 75 mL/hr at 02/09/15 1805    . acetaminophen (TYLENOL) tablet 650 mg  650 mg Oral Q4H PRN Nita Sells, MD      . aspirin chewable tablet 81 mg  81 mg Oral Daily Almyra Deforest, PA   81 mg at 02/10/15 1027  . camphor-menthol (SARNA) lotion   Topical PRN Nita Sells, MD      . Derrill Memo ON 02/11/2015] digoxin (LANOXIN) tablet 0.125 mg  0.125 mg Oral Daily Almyra Deforest, [place]      . diltiazem (CARDIZEM) 100 mg in dextrose 5 % 100 mL (1 mg/mL) infusion  5-15 mg/hr Intravenous Titrated Nita Sells, MD 5 mL/hr at 02/09/15 2200 5 mg/hr at 02/09/15 2200  . diphenhydrAMINE (BENADRYL) capsule 25 mg  25 mg Oral Q8H PRN Nita Sells, MD   25 mg at 02/10/15 0729  . feeding supplement (ENSURE ENLIVE) (ENSURE ENLIVE) liquid 237 mL  237 mL Oral q morning - 10a Maricela Bo [place], RD   237 mL at 02/10/15 1100  . heparin ADULT infusion 100 units/mL (25000 units/250 mL)  750 Units/hr Intravenous Continuous Nita Sells, MD 7.5  mL/hr at 02/09/15 2200 750 Units/hr at 02/09/15 2200  . ondansetron (ZOFRAN) injection 4 mg  4 mg Intravenous Q6H PRN Nita Sells, MD      . sorbitol 70 % solution 30 mL  30 mL Oral Daily Nita Sells, MD   30 mL at 02/10/15 1027    Allergies as of 02/09/2015  . (No Known Allergies)    Family History  Problem Relation Age of Onset  . Heart disease Mother 45  . Heart attack Father 46    died of heart attack.  . Leukemia    . Cancer - Lung      History   Social History  . Marital  Status: Single    Spouse Name: N/A  . Number of Children: 0  . Years of Education: N/A   Occupational History  . RETIRED     Social History Main Topics  . Smoking status: Never Smoker   . Smokeless tobacco: Never Used  . Alcohol Use: No  . Drug Use: No  . Sexual Activity: Not on file   Other Topics Concern  . Not on file   Social History Narrative   Prior used to be music and Conservation officer, nature   Independent of ADLs and IADLs   Last control 2014-has poor vision so stopped   Next of kin is his niece       Review of Systems: As per HPI, all others negative  Physical Exam: Vital signs in last 24 hours: Temp:  [97.4 F (36.3 C)-98.7 F (37.1 C)] 97.8 F (36.6 C) (07/21 0800) Pulse Rate:  [75-143] 135 (07/21 0700) Resp:  [18-31] 28 (07/21 0800) BP: (89-151)/(40-93) 151/40 mmHg (07/21 0800) SpO2:  [90 %-97 %] 96 % (07/21 0800) Weight:  [51.1 kg (112 lb 10.5 oz)] 51.1 kg (112 lb 10.5 oz) (07/20 1800) Last BM Date: 02/10/15 General:   Alert, elderly but overall pleasant and cooperative in NAD Head:  Normocephalic and atraumatic. Eyes:  Sclera clear, no icterus.   Conjunctiva pink. Ears:  Normal auditory acuity. Nose:  No deformity, discharge,  or lesions. Mouth:  No deformity or lesions.  Oropharynx pink & moist. Neck:  Supple; no masses or thyromegaly. Lungs:  Clear throughout to auscultation.   No wheezes, crackles, or rhonchi. No acute distress. Heart:  Regular rate and rhythm; no murmurs, clicks, rubs,  or gallops. Abdomen:  Soft, mild protuberant and mild dullness to percussion, consistent with high fecal burden. No masses, hepatosplenomegaly or hernias noted. Normal bowel sounds, without guarding, and without rebound.     Msk:  Diffuse atrophy, otherwise symmetrical without gross deformities. Normal posture. Pulses:  Normal pulses noted. Extremities:  Without clubbing or edema. Neurologic:  Alert and  oriented x4; diffusely weak, otherwise grossly normal  neurologically. Skin:  Thin and fragile, otherwise intact without significant lesions or rashes. Cervical Nodes:  No significant cervical adenopathy. Psych:  Alert and cooperative. Normal mood and affect.   Lab Results:  Recent Labs  02/09/15 1220 02/10/15 0340  WBC 31.8* 23.0*  HGB 12.6 11.0*  HCT 40.1 36.3  PLT 287 211   BMET  Recent Labs  02/09/15 1220 02/10/15 0340  NA 136 135  K 4.3 4.3  CL 107 108  CO2 21* 19*  GLUCOSE 110* 92  BUN 35* 21*  CREATININE 0.96 0.78  CALCIUM 8.9 8.2*   LFT  Recent Labs  02/09/15 1220  PROT 7.2  ALBUMIN 3.8  AST 27  ALT 13*  ALKPHOS 67  BILITOT  0.8   PT/INR  Recent Labs  02/09/15 1845  LABPROT 15.4*  INR 1.21    Studies/Results: Ct Abdomen Pelvis W Contrast  02/09/2015   CLINICAL DATA:  81 year old female with a history of constipation for 8 days.  EXAM: CT ABDOMEN AND PELVIS WITH CONTRAST  TECHNIQUE: Multidetector CT imaging of the abdomen and pelvis was performed using the standard protocol following bolus administration of intravenous contrast.  CONTRAST:  62mL OMNIPAQUE IOHEXOL 300 MG/ML  SOLN  COMPARISON:  None.  FINDINGS: Lower chest:  Unremarkable appearance of the soft tissues of the chest wall.  Heart size within normal limits.  No pericardial fluid/thickening.  No lower mediastinal adenopathy.  Unremarkable appearance of the distal esophagus.  No hiatal hernia.  No confluent airspace disease, pleural fluid, or pneumothorax within visualized lung. Chronic changes of the lung bases.  Abdomen/pelvis:  Unremarkable appearance of liver and spleen.  Unremarkable appearance of bilateral adrenal glands.  No peripancreatic or pericholecystic fluid or inflammatory changes.  No radio-opaque gallstones.  No intrahepatic or extrahepatic biliary ductal dilatation.  No intra-peritoneal free air or significant free-fluid.  Very large stool burden with formed stool throughout the length of the colon. Formed stool involves the rectum.  There is a narrowed segment of rectum. Hyper enhancement of the mucosa of the rectal wall with inflammatory changes and the meso rectum. No evidence of focal fluid collection or abscess. Thickening of the fascia planes within the anatomic pelvis.  Formed stool/fecalization of the distal small bowel.  No free air or significant free fluid within the abdomen.  Right Kidney/Ureter:  No hydronephrosis. No nephrolithiasis. No perinephric stranding. Unremarkable course of the right ureter.  Left Kidney/Ureter:  No hydronephrosis. No nephrolithiasis. No perinephric stranding.  Unremarkable course of the left ureter.  Unremarkable appearance of the urinary bladder.  Surgical changes of hysterectomy.  No significant vascular calcification. No aneurysm or periaortic fluid identified.  Musculoskeletal:  No displaced fracture identified.  Advanced degenerative changes of the visualized spine. Compression fracture of L2 and L4 of indeterminate age. Each of these levels demonstrates approximately 30% height loss.  Osteopenia.  No displaced fracture identified.  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 Recommend correlation with a history of obstipation/constipation. No evidence of perforation or abscess.  There is a narrowed segment/stricture of the rectum, just above the anorectal junction. Correlation with colonoscopy is recommended to rule out either a benign or malignant stricture.  Signed,  Dulcy Fanny. Earleen Newport, DO  [facility]  [facility]   Electronically Signed   By: Corrie Mckusick D.O.   On: 02/09/2015 16:06   Impression:  1.  Constipation/obstipation. 2.  Abnormal CT scan abdomen (proctitis and possible distal rectal stricture).  Plan:  1.  Clear liquid diet today. 2.  Dulcolax suppositories bid today. 3.  Slow peroral bowel  preparation today, consume in 12 hours. 4.  If patient felt to have good success with above bowel evacuation measures, will likely pursue flexible sigmoidoscopy tomorrow for evaluation of abnormal CT findings. 5.  Will follow.   LOS: 1 day   Odetta Forness M  02/10/2015, 11:25 AM  Pager [phone] If no answer or after 5 PM call [phone]

## 2015-02-10 NOTE — Progress Notes (Signed)
ANTICOAGULATION CONSULT NOTE - Follow Up Consult  Pharmacy Consult for Heparin Indication: atrial fibrillation  No Known Allergies  Patient Measurements: Height: 5\' 4"  (162.6 cm) Weight: 112 lb 10.5 oz (51.1 kg) IBW/kg (Calculated) : 54.7 Heparin Dosing Weight:   Vital Signs: Temp: 98.7 F (37.1 C) (07/21 0400) Temp Source: Oral (07/21 0400) BP: 113/52 mmHg (07/21 0400) Pulse Rate: 75 (07/21 0400)  Labs:  Recent Labs  02/09/15 1220 02/09/15 1845 02/10/15 0108 02/10/15 0340  HGB 12.6  --   --  11.0*  HCT 40.1  --   --  36.3  PLT 287  --   --  211  APTT  --  32  --   --   LABPROT  --  15.4*  --   --   INR  --  1.21  --   --   HEPARINUNFRC  --   --  0.41  --   CREATININE 0.96  --   --  0.78    Estimated Creatinine Clearance: 38.5 mL/min (by C-G formula based on Cr of 0.78).   Medications:  Infusions:  . sodium chloride 75 mL/hr at 02/09/15 1805  . diltiazem (CARDIZEM) infusion 5 mg/hr (02/09/15 2200)  . heparin 750 Units/hr (02/09/15 2200)    Assessment: Patient with heparin level at goal.  No heparin issues noted.    Goal of Therapy:  Heparin level 0.3-0.7 units/ml Monitor platelets by anticoagulation protocol: Yes   Plan:  Continue heparin drip at current rate Recheck level at 0900  Tyler Deis, Shea Stakes [place] 02/10/2015,5:58 AM

## 2015-02-10 NOTE — Progress Notes (Signed)
  Echocardiogram 2D Echocardiogram has been performed.  [name] M 02/10/2015, 10:13 AM

## 2015-02-10 NOTE — Progress Notes (Signed)
Walked into patient room and saw her lying on the floor undressed beside the bottom of the bed. pt had a toileting incident. She stated that she "slipped and fell while trying to go to the restroom." Patient was moved from floor to chair and cleaned up. Pt was dressed in a new gown and socks and was assisted into bed. Pt was tachycardic in the 130's when she was on the floor, however a repeat VS check once in bed showed all VS WNL once pt was back in bed. Pt's Neuro assessment was unchanged from before incident. Pt bed was put in low position with call bell within reach and bed alarm on. Pt repeatedly stated that she was not in any pain and that she did not hurt herself when falling. MD came to bedside for a second assessment. Fall occurred at 1235. Patient was moved to a room closer to the nurses station at 1633.

## 2015-02-10 NOTE — Consult Note (Signed)
CARDIOLOGY CONSULT NOTE   Patient ID: [name] MRN: [id number], DOB/AGE: 04/08/1923   Admit date: 02/09/2015 Date of Consult: 02/10/2015   Primary Physician: [name], MD Primary Cardiologist: previously Dr. Percival Spanish, lost to followup since June 2012  Pt. Profile  81 year old female with past medical history of permanent atrial fibrillation, history of diastolic heart failure in the setting of A. fib with RVR, interstitial lung disease, sweet syndrome, CML, orthostatic hypotension, and history of iron deficiency anemia present with constipation and was found in a-fib with RVR  Problem List  Past Medical History  Diagnosis Date  . Atrial fib/flutter, transient     Coumadin; status post TEE guided cardioversion 6/12  . Interstitial lung disease 12/17/10    CT scan  of chest, showed old compression fracture T6.  . Microcytic anemia     Iron deficient  . Thrombocytosis   . Hyponatremia   . history of Pneumonia 12/11/10  . Chronic diastolic heart failure     Echo 5/12: EF 60-65%, mild MR, mild BAE  . Chronic myelocytic leukemia     Dr. Ralene Ok  . Acute neutrophilic dermatosis     Sweet syndrome-prednisone therapy; [facility] dermatology    Past Surgical History  Procedure Laterality Date  . Hysterectomy      PARTIAL      Allergies  No Known Allergies  HPI   The patient is a 81 year old female with past medical history of permanent atrial fibrillation, history of diastolic heart failure in the setting of A. fib with RVR, interstitial lung disease, sweet syndrome, CML, orthostatic hypotension, and history of iron deficiency anemia. She was originally admitted from 5/18-5/21/2012 with rapid A. fib in the setting of community-acquired pneumonia. She was placed on 240 mg PO Cardizem, 0.163m digoxin and Coumadin at the time. She underwent TEE DC cardioversion. She also had volume overload in the setting of rapid A. Fib and required diuresis. Her CT demonstrating  interstitial lung disease. She was noted to have significantly elevated white blood cell count as well as thrombocytosis in the setting of iron deficiency anemia. She had significant skin rash. Punch biopsy demonstrated neutrophilic dermatosis or Sweet syndrome. She was placed on high-dose steroid-induced and developed toxic metabolic encephalopathy. Bone marrow biopsy did confirm chronic myelogenous leukemia. She has been followed by hematology oncology and was started on hydroxyurea back then.  Unfortunately, due to the patient's dementia and memory deficit, she has been having problems taking her medication in the past. She was seen by Dr. HPercival Spanishduring another admission on 01/15/2011, at which time it was noted the patient had recurrent A. fib with RVR after her TEE DCCV. Her diltiazem has stopped by that time due to hypotension and weakness. She was initially followed in our office with Coumadin, however her INR was 10.1 require reversal. She was unclear about her medication and seems to be confused. Eventually it decided to place the patient on aspirin only. Per Dr. HRosezella Floridaprevious consult note, the goal for rate control in this patient should be less than 110 bpm. It was also mentioned, this patient will not be started on amiodarone given her lung disease. Interestingly, she was discharged on 01/23/2011, at the time of discharge she was on aspirin, Midodrin, digoxin, hydroxyurea, and amiodarone 200 mg every 8 hours. He was seen by her pulmonologist Dr. WMelvyn Novas who was concerned about amiodarone use in this patient with interstitial lung disease. She was taken off of it.   Since then, it appears patient has been lost  to follow-up with multiple providers. When asked about her cardiologist, she denies any prior visit to a cardiologist, although she does say she was told when she was very young she had irregular heart rate. She does not remember the previous visit with Dr. Percival Spanish and states she does not have  a cardiologist. She has been living by herself. It is unclear who was taking care of her. She has since stopped majority of her medication. It is also unclear what is the last time she has followed up with oncology and hematology. She states that family living around her, however did not mention who the family is and how often they come by her house. She states she has been doing the cleaning by herself. She presented to [facility] on 02/09/2015 for several weeks of constipation. However was found to have rapid A. fib on arrival. Other significant laboratory finding include creatinine of 0.96, white blood cell count 31.8, she was heme positive. EKG showed rapid A. Fib/aflutter. Urinalysis was positive for UTI. CT of abdomen showed evidence of stercoral colitis of the rectum with very large formed stool burden of the rectum, the narrowed segment/stricture of the rectum above anal rectal junction. She has been placed on IV Cardizem overnight. Cardiology has been consulted for management of atrial fibrillation and consideration of anticoagulation therapy.       Inpatient Medications  . sorbitol  30 mL Oral Daily    Family History Family History  Problem Relation Age of Onset  . Heart disease Mother 73  . Heart attack Father 31    died of heart attack.  . Leukemia    . Cancer - Lung       Social History History   Social History  . Marital Status: Single    Spouse Name: N/A  . Number of Children: 0  . Years of Education: N/A   Occupational History  . RETIRED     Social History Main Topics  . Smoking status: Never Smoker   . Smokeless tobacco: Never Used  . Alcohol Use: No  . Drug Use: No  . Sexual Activity: Not on file   Other Topics Concern  . Not on file   Social History Narrative   Prior used to be music and Conservation officer, nature   Independent of ADLs and IADLs   Last control 2014-has poor vision so stopped   Next of kin is his niece        Review of  Systems  General:  No chills, fever, night sweats or weight changes.  Cardiovascular:  No chest pain, dyspnea on exertion, edema, orthopnea, palpitations, paroxysmal nocturnal dyspnea. Dermatological: No rash, lesions/masses Respiratory: No cough, dyspnea Urologic: No hematuria, dysuria Abdominal:   No nausea, vomiting. Constipated, states she has not had a bowel movement in 1-2 wks prior to arrival  Neurologic:  No visual changes, wkns, changes in mental status. All other systems reviewed and are otherwise negative except as noted above.  Physical Exam  Blood pressure 133/44, pulse 135, temperature 98.7 F (37.1 C), temperature source Oral, resp. rate 31, height 5' 4"  (1.626 m), weight 112 lb 10.5 oz (51.1 kg), SpO2 93 %.  General: Pleasant, NAD Psych: Normal affect. Neuro: Alert. Moves all extremities spontaneously. Cannot recall most information HEENT: Normal  Neck: Supple without bruits or JVD. Lungs:  Resp regular and unlabored. Few crackle near the bases, not consistent, otherwise, no wheezing or rhonchi.  Heart: irregular. no s3, s4, or murmurs. Abdomen: Soft, non-tender,  BS + x 4.  Extremities: No clubbing, cyanosis or edema. DP/PT/Radials 2+ and equal bilaterally.  Labs  No results for input(s): CKTOTAL, CKMB, TROPONINI in the last 72 hours. Lab Results  Component Value Date   WBC 23.0* 02/10/2015   HGB 11.0* 02/10/2015   HCT 36.3 02/10/2015   MCV 78.1 02/10/2015   PLT 211 02/10/2015    Recent Labs Lab 02/09/15 1220 02/10/15 0340  NA 136 135  K 4.3 4.3  CL 107 108  CO2 21* 19*  BUN 35* 21*  CREATININE 0.96 0.78  CALCIUM 8.9 8.2*  PROT 7.2  --   BILITOT 0.8  --   ALKPHOS 67  --   ALT 13*  --   AST 27  --   GLUCOSE 110* 92    Radiology/Studies  Ct Abdomen Pelvis W Contrast  02/09/2015   CLINICAL DATA:  81 year old female with a history of constipation for 8 days.  EXAM: CT ABDOMEN AND PELVIS WITH CONTRAST  TECHNIQUE: Multidetector CT imaging of the  abdomen and pelvis was performed using the standard protocol following bolus administration of intravenous contrast.  CONTRAST:  49m OMNIPAQUE IOHEXOL 300 MG/ML  SOLN  COMPARISON:  None.  FINDINGS: Lower chest:  Unremarkable appearance of the soft tissues of the chest wall.  Heart size within normal limits.  No pericardial fluid/thickening.  No lower mediastinal adenopathy.  Unremarkable appearance of the distal esophagus.  No hiatal hernia.  No confluent airspace disease, pleural fluid, or pneumothorax within visualized lung. Chronic changes of the lung bases.  Abdomen/pelvis:  Unremarkable appearance of liver and spleen.  Unremarkable appearance of bilateral adrenal glands.  No peripancreatic or pericholecystic fluid or inflammatory changes.  No radio-opaque gallstones.  No intrahepatic or extrahepatic biliary ductal dilatation.  No intra-peritoneal free air or significant free-fluid.  Very large stool burden with formed stool throughout the length of the colon. Formed stool involves the rectum. There is a narrowed segment of rectum. Hyper enhancement of the mucosa of the rectal wall with inflammatory changes and the meso rectum. No evidence of focal fluid collection or abscess. Thickening of the fascia planes within the anatomic pelvis.  Formed stool/fecalization of the distal small bowel.  No free air or significant free fluid within the abdomen.  Right Kidney/Ureter:  No hydronephrosis. No nephrolithiasis. No perinephric stranding. Unremarkable course of the right ureter.  Left Kidney/Ureter:  No hydronephrosis. No nephrolithiasis. No perinephric stranding.  Unremarkable course of the left ureter.  Unremarkable appearance of the urinary bladder.  Surgical changes of hysterectomy.  No significant vascular calcification. No aneurysm or periaortic fluid identified.  Musculoskeletal:  No displaced fracture identified.  Advanced degenerative changes of the visualized spine. Compression fracture of L2 and L4 of  indeterminate age. Each of these levels demonstrates approximately 30% height loss.  Osteopenia.  No displaced fracture identified.  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 Recommend correlation with a history of obstipation/constipation. No evidence of perforation or abscess.  There is a narrowed segment/stricture of the rectum, just above the anorectal junction. Correlation with colonoscopy is recommended to rule out either a benign or malignant stricture.  Signed,  JDulcy Fanny WEarleen Newport DO  [facility]  [facility]   Electronically Signed   By: JCorrie MckusickD.O.   On: 02/09/2015 16:06    ECG  Coarse a-fib vs aflutter  with RVR  ASSESSMENT AND PLAN  1. Permanent atrial fibrillation/aflutter with rapid ventricular response   - CHA2DS2-VASC score of 4 (female, age, HF). This patients CHA2DS2-VASc Score and unadjusted Ischemic Stroke Rate (% per year) is equal to 4.8 % stroke rate/year from a score of 4  - she was taken off of coumadin in June 2012 as she has trouble remembering her medication and had INR of 10.1 at one point. Given her advance age, positive hemacult and mental status, would start on ASA only. Not good candidate for any systemic anticoagulation  - given the lack of systemic anticoagulation, would not attempt to cardiovert her as well which will increase the risk of stroke  - will focus on rate control only. She is currently on IV cardizem, she has pretty significant orthostatic hypotension in 2012 on 270m PO cardizem. Will need to reassess orthostatic vital sign during this admission, may get away with only 1280mPO cardizem.   - she is not a candidate for amiodarone for rate control purpose given h/o interstitial lung dx. However previously HR controlled on digoxin, unclear how she came off of all of her  medication after lost to followup. Will start digoxin for HR, it is not the most ideal drug in this elderly pt, however other option is limited.   - otherwise limited option is available in this elderly frail female with h/o poor compliance. Will need to discuss code status. Also given how long she may have off her medication, will need to check echocardiogram   2. Chronic diastolic HF in the setting of rapid a-fib  - only a few crackle in the bases of lung, otherwise no obvious sign of fluid overload. Will hold off adding lasix.  - pending echo  3. UTI: per IM  4. Constipation with possible colitis seen on CT: per IM  5. CML: need to reestablish with oncology/hematology. Note WBC on arrival >30, not expected from UTI alone  6. Sweet syndrome  - acute onset of rash this morning in this pt who had CML and Sweet syndrome, need to monitor the trend of WBC and any potential onset of fever.   - note h/o toxic metabolic encephalopathy on high dose steroid  7. interstitial lung disease 8. orthostatic hypotension 9. history of iron deficiency anemia.  SiHilbert CorriganPA-C 02/10/2015, 8:13 AM Patient seen and examined and history reviewed. Agree with above findings and plan. [age] WF with multiple medical problems as outlined above. Seen for management of Afib. She has a history of permanent AFib for at least 4 years. Has not been taking medication regularly. No cardiac follow up. She denies any chest pain, dyspnea, or dizziness. No palpitations. Exam reveals and elderly female, confused, in NAD. No JVD or rales. CV IRRR without murmur. No edema. Will focus on rate control she is not a candidate for amiodarone due to chronic lung disease. Would continue IV cardizem and reload with digoxin. Once rate controlled will transition to po diltiazem with close watch of BP. Target HR <115. Echo pending. She does not appear volume overloaded. Agree that she is not a candidate for chronic anticoagulation. ASA  81 mg daily is best alternative.   Alenah Sarria JoMartiniqueMDDutton/21/2016 12:01 PM

## 2015-02-10 NOTE — Progress Notes (Signed)
Initial Nutrition Assessment  DOCUMENTATION CODES:   Not applicable  INTERVENTION:  - Will order Ensure Enlive once/day, this supplement provides 350 kcal and 20 grams of protein - RD will continue to monitor for needs  NUTRITION DIAGNOSIS:   Inadequate oral intake related to lethargy/confusion as evidenced by per patient/family report.  GOAL:   Patient will meet greater than or equal to 90% of their needs  MONITOR:   PO intake, Weight trends, Labs  REASON FOR ASSESSMENT:   Malnutrition Screening Tool  ASSESSMENT:  81 y/o female known A.fib Chad2Vasc2 ~5 not on AC nor rate controlling agent, chronic myelocytic leukemia  and previously on Hydrea, Sweet syndrome diagnosed on punch biopsy, interstitial lung disease, prior admission 5/18-->12/11/10 for Afib + RVR and was apparently cardioverted at that time with restoration of normal sinus rhythm. She presented to the emergency room with apparent one week constipation and was found to be in rapid A. fib with lower abdominal tenderness heme-positive brown stool on rectal and C. difficile was negative.  Pt seen for MST. BMI indicates normal weight status. Pt states she has not yet eaten breakfast today. She states she likely had a poor appetite for the past 2-3 weeks because she was "out of it" during this time.   She states that her clothes having been fitting the same and is unsure of UBW. Limited weight hx available in the chart. No muscle or fat wasting noted during physical assessment. Pt did not drink supplements such as Boost or Ensure PTA but states "I had a big glass of ice cold milk each day."  Will monitor intakes during admission to assess if pt is meeting needs. Will order Ensure Enlive once/day to supplement. Medications reviewed. Labs reviewed; BUN elevated, Ca: 8.2 mg/dL.   Diet Order:     Skin:  Reviewed, no issues  Last BM:  PTA  Height:   Ht Readings from Last 1 Encounters:  02/09/15 5\' 4"  (1.626 m)     Weight:   Wt Readings from Last 1 Encounters:  02/09/15 112 lb 10.5 oz (51.1 kg)    Ideal Body Weight:  54.54 kg (kg)  Wt Readings from Last 10 Encounters:  02/09/15 112 lb 10.5 oz (51.1 kg)  10/04/11 127 lb 6.4 oz (57.788 kg)  01/09/11 101 lb (45.813 kg)    BMI:  Body mass index is 19.33 kg/(m^2).  Estimated Nutritional Needs:   Kcal:  6837-2902  Protein:  50-60 grams  Fluid:  2.5 L/day  EDUCATION NEEDS:   No education needs identified at this time    Jarome Matin, RD, LDN Inpatient Clinical Dietitian Pager # [phone] After hours/weekend pager # [phone]

## 2015-02-11 ENCOUNTER — Encounter (HOSPITAL_COMMUNITY)
Admission: EM | Disposition: A | Discharge status: Skilled Nursing Facility | Payer: Self-pay | Source: Home / Self Care | Attending: Internal Medicine

## 2015-02-11 DIAGNOSIS — K5909 Other constipation: Secondary | ICD-10-CM

## 2015-02-11 DIAGNOSIS — K624 Stenosis of anus and rectum: Secondary | ICD-10-CM | POA: Diagnosis present

## 2015-02-11 DIAGNOSIS — D72829 Elevated white blood cell count, unspecified: Secondary | ICD-10-CM | POA: Diagnosis present

## 2015-02-11 DIAGNOSIS — I4891 Unspecified atrial fibrillation: Secondary | ICD-10-CM | POA: Diagnosis present

## 2015-02-11 LAB — CBC WITH DIFFERENTIAL/PLATELET
Basophils Absolute: 0 10*3/uL (ref 0.0–0.1)
Basophils Relative: 0 % (ref 0–1)
EOS PCT: 1 % (ref 0–5)
Eosinophils Absolute: 0.3 10*3/uL (ref 0.0–0.7)
HEMATOCRIT: 40.4 % (ref 36.0–46.0)
Hemoglobin: 12.3 g/dL (ref 12.0–15.0)
LYMPHS PCT: 7 % — AB (ref 12–46)
Lymphs Abs: 2.1 10*3/uL (ref 0.7–4.0)
MCH: 24.7 pg — ABNORMAL LOW (ref 26.0–34.0)
MCHC: 30.4 g/dL (ref 30.0–36.0)
MCV: 81.1 fL (ref 78.0–100.0)
MONOS PCT: 4 % (ref 3–12)
Monocytes Absolute: 1.2 10*3/uL — ABNORMAL HIGH (ref 0.1–1.0)
Neutro Abs: 25.7 10*3/uL — ABNORMAL HIGH (ref 1.7–7.7)
Neutrophils Relative %: 88 % — ABNORMAL HIGH (ref 43–77)
Platelets: 336 10*3/uL (ref 150–400)
RBC: 4.98 MIL/uL (ref 3.87–5.11)
RDW: 20.2 % — ABNORMAL HIGH (ref 11.5–15.5)
WBC: 29.3 10*3/uL — AB (ref 4.0–10.5)

## 2015-02-11 LAB — COMPREHENSIVE METABOLIC PANEL
ALT: 14 U/L (ref 14–54)
ANION GAP: 8 (ref 5–15)
AST: 31 U/L (ref 15–41)
Albumin: 3.3 g/dL — ABNORMAL LOW (ref 3.5–5.0)
Alkaline Phosphatase: 67 U/L (ref 38–126)
BUN: 13 mg/dL (ref 6–20)
CO2: 19 mmol/L — AB (ref 22–32)
CREATININE: 0.84 mg/dL (ref 0.44–1.00)
Calcium: 8.3 mg/dL — ABNORMAL LOW (ref 8.9–10.3)
Chloride: 108 mmol/L (ref 101–111)
GFR calc Af Amer: >60 mL/min (ref >60)
GFR, EST NON AFRICAN AMERICAN: 60 mL/min — AB (ref >60)
Glucose, Bld: 109 mg/dL — ABNORMAL HIGH (ref 65–99)
Potassium: 4.2 mmol/L (ref 3.5–5.1)
SODIUM: 135 mmol/L (ref 135–145)
TOTAL PROTEIN: 6.1 g/dL — AB (ref 6.5–8.1)
Total Bilirubin: 0.7 mg/dL (ref 0.3–1.2)

## 2015-02-11 LAB — SAVE SMEAR

## 2015-02-11 SURGERY — SIGMOIDOSCOPY, FLEXIBLE
Anesthesia: Moderate Sedation | Laterality: Left

## 2015-02-11 MED ORDER — HALOPERIDOL LACTATE 5 MG/ML IJ SOLN
10.0000 mg | Freq: Once | INTRAMUSCULAR | Status: AC
  Administered 2015-02-11: 10 mg via INTRAVENOUS

## 2015-02-11 MED ORDER — HALOPERIDOL LACTATE 5 MG/ML IJ SOLN
5.0000 mg | Freq: Once | INTRAMUSCULAR | Status: AC
  Administered 2015-02-11: 5 mg via INTRAVENOUS
  Filled 2015-02-11: qty 1

## 2015-02-11 MED ORDER — LORAZEPAM 0.5 MG PO TABS
0.2500 mg | ORAL_TABLET | Freq: Once | ORAL | Status: AC | PRN
  Filled 2015-02-11: qty 1

## 2015-02-11 MED ORDER — HALOPERIDOL LACTATE 5 MG/ML IJ SOLN
INTRAMUSCULAR | Status: AC
  Filled 2015-02-11: qty 2

## 2015-02-11 NOTE — Progress Notes (Signed)
Date:  February 11, 2015 U.R. performed for needs and level of care. Will continue to follow for Case Management needs.  Rhonda Davis, RN, BSN, CCM   [phone] 

## 2015-02-11 NOTE — Care Management Important Message (Signed)
Important Message  Patient Details  Name: [name] MRN: [id number] Date of Birth: Oct 30, 1922   Medicare Important Message Given:  Yes-second notification given    Shelda Altes 02/11/2015, 2:44 [place] Message  Patient Details  Name: [name] MRN: [id number] Date of Birth: 02-06-1923   Medicare Important Message Given:  Yes-second notification given    Shelda Altes 02/11/2015, 2:44 PM

## 2015-02-11 NOTE — Progress Notes (Signed)
Subjective: Became combative overnight; didn't complete prep; pulled out IVs.  Objective: Vital signs in last 24 hours: Temp:  [97.5 F (36.4 C)-98.3 F (36.8 C)] 98.1 F (36.7 C) (07/22 0900) Pulse Rate:  [39-142] 87 (07/22 1104) Resp:  [20-35] 28 (07/22 1104) BP: (92-155)/(35-128) 111/71 mmHg (07/22 1104) SpO2:  [91 %-100 %] 96 % (07/22 1104) Weight:  [53.5 kg (117 lb 15.1 oz)] 53.5 kg (117 lb 15.1 oz) (07/22 1108) Weight change:  Last BM Date: 02/11/15  PE: GEN:  Somnolent (medicated). ABD:  Soft, only mildly distended, active   Lab Results: CBC    Component Value Date/Time   WBC 23.0* 02/10/2015 0340   RBC 4.65 02/10/2015 0340   HGB 11.0* 02/10/2015 0340   HCT 36.3 02/10/2015 0340   PLT 211 02/10/2015 0340   MCV 78.1 02/10/2015 0340   MCH 23.7* 02/10/2015 0340   MCHC 30.3 02/10/2015 0340   RDW 19.3* 02/10/2015 0340   LYMPHSABS 1.6 07/17/2014 1849   MONOABS 1.0 07/17/2014 1849   EOSABS 0.2 07/17/2014 1849   BASOSABS 0.2* 07/17/2014 1849   CMP     Component Value Date/Time   NA 135 02/11/2015 0733   K 4.2 02/11/2015 0733   CL 108 02/11/2015 0733   CO2 19* 02/11/2015 0733   GLUCOSE 109* 02/11/2015 0733   BUN 13 02/11/2015 0733   CREATININE 0.84 02/11/2015 0733   CALCIUM 8.3* 02/11/2015 0733   PROT 6.1* 02/11/2015 0733   ALBUMIN 3.3* 02/11/2015 0733   AST 31 02/11/2015 0733   ALT 14 02/11/2015 0733   ALKPHOS 67 02/11/2015 0733   BILITOT 0.7 02/11/2015 0733   GFRNONAA 60* 02/11/2015 0733   GFRAA >60 02/11/2015 0733   Assessment:  1. Constipation/obstipation. 2. Abnormal CT scan abdomen (proctitis and possible distal rectal stricture). 3.  Agitated, combative.  Sundowning?  Medication effect?  Plan:  1.  Hold off on sigmoidoscopy for the present time.  Would hold off on further attempts at oral PEG lavage prep, for the time-being. 2.  Tomorrow, once/if she is more alert and interactive, would consider serial enemas and manual disimpactions slowly  over the next few days, with goal of possible sigmoidoscopy early/mid next week. 3.  Case discussed in detail over telephone with patient's niece, Stanton Kidney. 4.  Will follow.   Landry Dyke 02/11/2015, 11:48 AM   Pager [phone] If no answer or after 5 PM call [phone]

## 2015-02-11 NOTE — Progress Notes (Signed)
[facility] PROGRESS NOTE  [name] ASN:[id number] DOB: 02-16-1923 DOA: 02/09/2015 PCP: [name], MD  HPI/Subjective:  81 year old female with known a. Fib , CML, sweet syndrome, interstitial lung disease, diastolic heart failure, and microcytic anemia presented to the ED with constipation x 1 week with lower abdominal tenderness and was found to be in afib with RVR. She was admitted to step down for atrial fibrillation and constipation. She was not on a rate controlling agent or anticoagulant. She had a prior admission for afib 5/18-5/21/16 and was cardioverted at that time to restore normal sinus rhythm. She had heme + brown stool on rectal exam and c. Diff negative. Her relatives have indicated that she has become increasingly forgetful with a slight slowing of mentation over the past 6 months.    Subjective:  Patient was extremely combative, agitated, and  confused last night. She had one fall while trying to get out of bed. She was given lorazepam and placed in soft restraints. She did not sleep much last night, but slept soundly this morning and upon waking she was oriented. She has no complaints at this time, denies CP, SOB, abdominal pain, NV, and headache. She states the itching has subsided.   Assessment/Plan:  1. Atrial fibrillation -Chad2vasc2 score is 4 - she has not previously been on a rate controlling agent or anticoagulant - 1 prior admission where she had to be cardioverted - EKG on 02/10/15 demonstrated atrial flutter with variable conduction and on telemetry patient has predominately been in atrial fibrillation. -Cardizem drip at 5 and titrate to heatrt rate of less than 110-do not convert to PO until decisions made and discussion abou medical vs. CV options are discussed with patient and family by Cardiology Marlana Salvage has some fears about Cardizem] -Echo 60-65% EF with no wall motion abnormalities - IV heparin GTT- monitor counts carefully given history of chronic  myelocytic leukemia - Some memory loss may be due to microvascular insults secondary to A.fib -Cardiology consulted appreciated - Dig and ASA per cardiology  Toxic Metabolic Encephalopathy - MMSE demonstrated 27/30 -Overnight, probable delirium secondary to a 1st generation antihistamine - Seems back to baseline  Acute neutrophilic dermatosis/Sweet syndrome -Stable at present time -RA factor elevated in the past 11/2010 might be in keeping with this - Itching/rash with skin dryness - Sarna lotion PRN - Discontinue benadryl due to AMS last night  Chronic myelocytic leukemia with potential leukemoid reaction  -Add on platelets smear as well as lap score -WBC decreased to 23 from 31.8 -Discontinue Cipro/Flagyl as no specific indication I can see -PLT and Hemoglobin are fine currently -?Heme/onc will be consulted dependant on LAP and Platelet smear  Chronic diastolic heart failure - Currently stable, no signs of fluid overload - No previous echo availabe - Echo pending for evaluation of clots and heart function - Cardiology to follow   Constipation - functional - Passed loose stool last night after drinking Sorbitol, no blood in stool - Unlikely infectious process - Continue Lactulose/sorbitol scheduled till regular stools - GI has been consulted by the emergency room regarding stricture? - Patient has never had a colonoscopy - Was scheduled for colonoscopy today 02/11/15, but it was cancelled as patient did not complete bowel prep due to AMS last night.   Microcytic anemia -h/o of Sweet syndrome as well as CML -might neeed Oncology input as there is overlap between AML and Sweey syndrome  Asymptomatic bacteriuria - No dysuria or fever - Discontinued antibiotic after ED dose, no signs of  infection and patient is asymptomatic  Inadequate oral intake - Likely due to lethargy/confision per patient and family, patient also reports decreased appetite  - Consulted  nutrition - Ensure per their recommendation, monitor daily weight   ILD on prior steroids -Monitor -Poor candidate for Amiodarone for rate control  Code Status: Full Family Communication: Patient and 2 daughters in room Disposition Plan: Remain inpatient  Consultants:  Cardiology  GI  Procedures: Colonoscopy originally scheduled for 02/11/15, cancelled due to AMS last night and patient did not complete bowel prep.  Antibiotics:  Cipro 02/09/15 >> 02/09/15  Flagyl 02/09/15 >> 02/09/15    Objective: Filed Vitals:   02/11/15 0900  BP:   Pulse:   Temp: 98.1 F (36.7 C)  Resp:     Intake/Output Summary (Last 24 hours) at 02/11/15 0941 Last data filed at 02/11/15 0600  Gross per 24 hour  Intake 2251.95 ml  Output      0 ml  Net 2251.95 ml   Filed Weights   02/09/15 1800  Weight: 51.1 kg (112 lb 10.5 oz)    Exam:   General:  Patient seated comfortably in bed in no distress, is alert, oriented and pleasant. Was in soft restraints due to AMS last night, but these have now been removed.  Cardiovascular: Irregular rate, no m/r/g, no LE edema  Respiratory: Clear to auscultation, normal respiratory effort  Abdomen: Bowel sounds present, soft, non-tender, some diffuse mild erythema, no maculo-papular rash visualized  Musculoskeletal: Grossly normal tone  Neuro: Alter and oriented, MMSE score 27/30  Data Reviewed: Basic Metabolic Panel:  Recent Labs Lab 02/09/15 1220 02/09/15 1845 02/10/15 0340 02/11/15 0733  NA 136  --  135 135  K 4.3  --  4.3 4.2  CL 107  --  108 108  CO2 21*  --  19* 19*  GLUCOSE 110*  --  92 109*  BUN 35*  --  21* 13  CREATININE 0.96  --  0.78 0.84  CALCIUM 8.9  --  8.2* 8.3*  MG  --  1.8  --   --    Liver Function Tests:  Recent Labs Lab 02/09/15 1220 02/11/15 0733  AST 27 31  ALT 13* 14  ALKPHOS 67 67  BILITOT 0.8 0.7  PROT 7.2 6.1*  ALBUMIN 3.8 3.3*    Recent Labs Lab 02/09/15 1220  LIPASE 23   No results for  input(s): AMMONIA in the last 168 hours. CBC:  Recent Labs Lab 02/09/15 1220 02/10/15 0340  WBC 31.8* 23.0*  HGB 12.6 11.0*  HCT 40.1 36.3  MCV 78.3 78.1  PLT 287 211    Recent Results (from the past 240 hour(s))  Clostridium Difficile by PCR (not at [facility])     Status: None   Collection Time: 02/09/15  2:21 PM  Result Value Ref Range Status   C difficile by pcr NEGATIVE NEGATIVE Final  MRSA PCR Screening     Status: None   Collection Time: 02/09/15  5:55 PM  Result Value Ref Range Status   MRSA by PCR NEGATIVE NEGATIVE Final    Comment:        The GeneXpert MRSA Assay (FDA approved for NASAL specimens only), is one component of a comprehensive MRSA colonization surveillance program. It is not intended to diagnose MRSA infection nor to guide or monitor treatment for MRSA infections.      Studies: Ct Abdomen Pelvis W Contrast  02/09/2015   CLINICAL DATA:  81 year old female with a history of  constipation for 8 days.  EXAM: CT ABDOMEN AND PELVIS WITH CONTRAST  TECHNIQUE: Multidetector CT imaging of the abdomen and pelvis was performed using the standard protocol following bolus administration of intravenous contrast.  CONTRAST:  86mL OMNIPAQUE IOHEXOL 300 MG/ML  SOLN  COMPARISON:  None.  FINDINGS: Lower chest:  Unremarkable appearance of the soft tissues of the chest wall.  Heart size within normal limits.  No pericardial fluid/thickening.  No lower mediastinal adenopathy.  Unremarkable appearance of the distal esophagus.  No hiatal hernia.  No confluent airspace disease, pleural fluid, or pneumothorax within visualized lung. Chronic changes of the lung bases.  Abdomen/pelvis:  Unremarkable appearance of liver and spleen.  Unremarkable appearance of bilateral adrenal glands.  No peripancreatic or pericholecystic fluid or inflammatory changes.  No radio-opaque gallstones.  No intrahepatic or extrahepatic biliary ductal dilatation.  No intra-peritoneal free air or significant  free-fluid.  Very large stool burden with formed stool throughout the length of the colon. Formed stool involves the rectum. There is a narrowed segment of rectum. Hyper enhancement of the mucosa of the rectal wall with inflammatory changes and the meso rectum. No evidence of focal fluid collection or abscess. Thickening of the fascia planes within the anatomic pelvis.  Formed stool/fecalization of the distal small bowel.  No free air or significant free fluid within the abdomen.  Right Kidney/Ureter:  No hydronephrosis. No nephrolithiasis. No perinephric stranding. Unremarkable course of the right ureter.  Left Kidney/Ureter:  No hydronephrosis. No nephrolithiasis. No perinephric stranding.  Unremarkable course of the left ureter.  Unremarkable appearance of the urinary bladder.  Surgical changes of hysterectomy.  No significant vascular calcification. No aneurysm or periaortic fluid identified.  Musculoskeletal:  No displaced fracture identified.  Advanced degenerative changes of the visualized spine. Compression fracture of L2 and L4 of indeterminate age. Each of these levels demonstrates approximately 30% height loss.  Osteopenia.  No displaced fracture identified.  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 Recommend correlation with a history of obstipation/constipation. No evidence of perforation or abscess.  There is a narrowed segment/stricture of the rectum, just above the anorectal junction. Correlation with colonoscopy is recommended to rule out either a benign or malignant stricture.  Signed,  Dulcy Fanny. Earleen Newport, DO  [facility]  [facility]   Electronically Signed   By: Corrie Mckusick D.O.   On: 02/09/2015 16:06    Scheduled Meds: . aspirin  81 mg Oral Daily  . digoxin  0.125 mg Oral Daily  . feeding supplement (ENSURE  ENLIVE)  237 mL Oral q morning - 10a  . sorbitol  30 mL Oral Daily   Continuous Infusions: . sodium chloride 75 mL/hr at 02/10/15 2133  . diltiazem (CARDIZEM) infusion Stopped (02/11/15 0500)    Principal Problem:   Atrial fibrillation Active Problems:   Acute neutrophilic dermatosis   Chronic myelocytic leukemia   Chronic diastolic heart failure   UTI (lower urinary tract infection)   Constipation - functional   Microcytic anemia   Asymptomatic bacteriuria   New onset a-fib   Chronic atrial fibrillation  Time spent: [place], PA-S Verneita Griffes, MD  [facility] Pager [phone]. If 7PM-7AM, please contact night-coverage at [email or web address], password [facility] 02/11/2015, 9:41 AM  LOS: 2 days       I agree with the History/assesment & plan per [facility]  provider as per above, and independently assessed and discussed the plan of care with the patient, and ammendments were made to above note reflecting my thoughts after careful review of Database, Progress notes, Imaging and Consultant notes Verneita Griffes, MD [facility] [phone]

## 2015-02-11 NOTE — Progress Notes (Signed)
Patient Name: [name] Date of Encounter: 02/11/2015  Primary Cardiologist: previously Dr. Percival Spanish, lost to followup since June 2012   Principal Problem:   Atrial fibrillation Active Problems:   Acute neutrophilic dermatosis   Chronic myelocytic leukemia   Chronic diastolic heart failure   UTI (lower urinary tract infection)   Constipation - functional   Microcytic anemia   Asymptomatic bacteriuria   New onset a-fib   Chronic atrial fibrillation    SUBJECTIVE  Sound asleep during my interview. Sitter at bedside, tied to the bed. Per nursing note, combative last night and confused, given haldol and benzo. Had a fall yesterday afternoon as well.   CURRENT MEDS . aspirin  81 mg Oral Daily  . digoxin  0.125 mg Oral Daily  . feeding supplement (ENSURE ENLIVE)  237 mL Oral q morning - 10a  . sorbitol  30 mL Oral Daily    OBJECTIVE  Filed Vitals:   02/11/15 0346 02/11/15 0400 02/11/15 0600 02/11/15 0700  BP: 100/54 92/38 118/35 130/52  Pulse: 95 100 131 113  Temp:  98 F (36.7 C)    TempSrc:  Oral    Resp: 31 26 33 28  Height:      Weight:      SpO2: 92% 94% 95% 98%    Intake/Output Summary (Last 24 hours) at 02/11/15 0811 Last data filed at 02/11/15 0600  Gross per 24 hour  Intake 2331.95 ml  Output      0 ml  Net 2331.95 ml   Filed Weights   02/09/15 1800  Weight: 112 lb 10.5 oz (51.1 kg)    PHYSICAL EXAM  General: Asleep, sitter at bedside, tied to the bed. HEENT:  Normal  Neck: Supple without bruits or JVD. Lungs:  Resp regular and unlabored. Difficult to assess anteriorly with loud snore.  Heart: irregular, tachycardic. no s3, s4, or murmurs. Abdomen: Soft, non-distended, BS + x 4.  Extremities: No clubbing, cyanosis or edema. Wrist tied to the bedside  Accessory Clinical Findings  CBC  Recent Labs  02/09/15 1220 02/10/15 0340  WBC 31.8* 23.0*  HGB 12.6 11.0*  HCT 40.1 36.3  MCV 78.3 78.1  PLT 287 329   Basic Metabolic  Panel  Recent Labs  02/09/15 1220 02/09/15 1845 02/10/15 0340  NA 136  --  135  K 4.3  --  4.3  CL 107  --  108  CO2 21*  --  19*  GLUCOSE 110*  --  92  BUN 35*  --  21*  CREATININE 0.96  --  0.78  CALCIUM 8.9  --  8.2*  MG  --  1.8  --    Liver Function Tests  Recent Labs  02/09/15 1220  AST 27  ALT 13*  ALKPHOS 67  BILITOT 0.8  PROT 7.2  ALBUMIN 3.8    Recent Labs  02/09/15 1220  LIPASE 23   Thyroid Function Tests  Recent Labs  02/09/15 1845  TSH 4.298    TELE A-fib and a-fib with HR 90-110s most of the time, however had frequent jump into 130s    ECG  No new EKG  Echocardiogram 02/10/2015  LV EF: 60% -  65%  ------------------------------------------------------------------- Indications:   Atrial fibrillation - 427.31.  ------------------------------------------------------------------- History:  PMH: Microcytic Anemia. Chronic Myelocytic Leukemia. Lower Extremity Edema. Thrombocytosis. Congestive heart failure.  ------------------------------------------------------------------- Study Conclusions  - Left ventricle: The cavity size was normal. Systolic function was normal. The estimated ejection fraction was in the  range of 60% to 65%. Wall motion was normal; there were no regional wall motion abnormalities. - Mitral valve: Moderately calcified annulus. - Pulmonary arteries: Systolic pressure was mildly increased. PA peak pressure: 39 mm Hg (S).    Radiology/Studies  Ct Abdomen Pelvis W Contrast  02/09/2015   CLINICAL DATA:  81 year old female with a history of constipation for 8 days.  EXAM: CT ABDOMEN AND PELVIS WITH CONTRAST  TECHNIQUE: Multidetector CT imaging of the abdomen and pelvis was performed using the standard protocol following bolus administration of intravenous contrast.  CONTRAST:  106mL OMNIPAQUE IOHEXOL 300 MG/ML  SOLN  COMPARISON:  None.  FINDINGS: Lower chest:  Unremarkable appearance of the soft tissues  of the chest wall.  Heart size within normal limits.  No pericardial fluid/thickening.  No lower mediastinal adenopathy.  Unremarkable appearance of the distal esophagus.  No hiatal hernia.  No confluent airspace disease, pleural fluid, or pneumothorax within visualized lung. Chronic changes of the lung bases.  Abdomen/pelvis:  Unremarkable appearance of liver and spleen.  Unremarkable appearance of bilateral adrenal glands.  No peripancreatic or pericholecystic fluid or inflammatory changes.  No radio-opaque gallstones.  No intrahepatic or extrahepatic biliary ductal dilatation.  No intra-peritoneal free air or significant free-fluid.  Very large stool burden with formed stool throughout the length of the colon. Formed stool involves the rectum. There is a narrowed segment of rectum. Hyper enhancement of the mucosa of the rectal wall with inflammatory changes and the meso rectum. No evidence of focal fluid collection or abscess. Thickening of the fascia planes within the anatomic pelvis.  Formed stool/fecalization of the distal small bowel.  No free air or significant free fluid within the abdomen.  Right Kidney/Ureter:  No hydronephrosis. No nephrolithiasis. No perinephric stranding. Unremarkable course of the right ureter.  Left Kidney/Ureter:  No hydronephrosis. No nephrolithiasis. No perinephric stranding.  Unremarkable course of the left ureter.  Unremarkable appearance of the urinary bladder.  Surgical changes of hysterectomy.  No significant vascular calcification. No aneurysm or periaortic fluid identified.  Musculoskeletal:  No displaced fracture identified.  Advanced degenerative changes of the visualized spine. Compression fracture of L2 and L4 of indeterminate age. Each of these levels demonstrates approximately 30% height loss.  Osteopenia.  No displaced fracture identified.  IMPRESSION: Evidence of stercoral colitis of the rectum, with very large formed stool burden of the rectum and the entire length  of the colon, with redundancy of the sigmoid colon. Recommend correlation with a history of obstipation/constipation. No evidence of perforation or abscess.  There is a narrowed segment/stricture of the rectum, just above the anorectal junction. Correlation with colonoscopy is recommended to rule out either a benign or malignant stricture.  Signed,  Dulcy Fanny. Earleen Newport, DO  [facility]  [facility]   Electronically Signed   By: Corrie Mckusick D.O.   On: 02/09/2015 16:06    ASSESSMENT AND PLAN  1. Permanent atrial fibrillation/aflutter with rapid ventricular response  - CHA2DS2-VASC score of 4 (female, age, HF). This patients CHA2DS2-VASc Score and unadjusted Ischemic Stroke Rate (% per year) is equal to 4.8 % stroke rate/year from a score of 4 - she was taken off of coumadin in June 2012 as she has trouble remembering her medication and had INR of 10.1 at one point. Given her advance age, positive hemacult and mental status, would start on ASA only. Not good candidate for any systemic anticoagulation - given the lack of systemic anticoagulation, would not attempt to cardiovert  her as well which will increase the risk of stroke - she is not a candidate for amiodarone for rate control purpose given h/o interstitial lung dx. However previously HR controlled on digoxin, unclear how she came off of all of her medication after lost to followup. Will start digoxin for HR, it is not the most ideal drug in this elderly pt, however other option is limited.  - otherwise limited option is available in this elderly frail female with h/o poor compliance. Will focus on rate control only. She is currently on IV cardizem, does not appears that the orthostatic vital sign was done yesterday before she got confused and combative, however she's likely not going to tolerate even low dose of PO diltiazem. Given her current  mental status, will keep on IV diltiazem. Only given 2 dose of digoxin yesterday, however given what happened last night, will check digoxin level.  Will need to discuss code status.   - despite prolonged period off her cardiac med prior to arrival, echo yesterday was reassuring with normal EF.   2. Chronic diastolic HF in the setting of rapid a-fib - Echo 02/10/2015 EF 60-65%, no RWMA, moderately calcified mitral annulus, PA peak pressure 64mmHg  3. UTI: per IM  4. Constipation with possible colitis seen on CT:  - seen by GI, plan for possible flex sig once pt finish bowel prep, unfortunately she was confused and combative last night and did not finish bowel prep.  5. CML: need to reestablish with oncology/hematology. Note WBC on arrival >30, not expected from UTI alone  6. Sweet syndrome - acute onset of rash this morning in this pt who had CML and Sweet syndrome, need to monitor the trend of WBC and any potential onset of fever.  - note h/o toxic metabolic encephalopathy on high dose steroid  7. interstitial lung disease 8. orthostatic hypotension 9. history of iron deficiency anemia.   Hilbert Corrigan PA-C Pager: [phone] Patient seen and examined and history reviewed. Agree with above findings and plan. Events of last night and yesterday noted. Fell yesterday. Increased confusion last night. Family notes that confusion occurred on prior hospitalization and thinks it may have been related to medication. She is very sedated now. Afib rate is fairly well controlled on IV diltiazem and Dig. Will check Dig level in am. Will need to continue IV diltiazem until mental status improves then try and transition to po.  Kaelen Brennan [place], [place] 02/11/2015 10:15 AM

## 2015-02-11 NOTE — Progress Notes (Signed)
[place] Progress Note Patient Name: [name] DOB: [birth year] MRN: [id number]   Date of Service  02/11/2015  HPI/Events of Note  Continued agitation despite 5 mg IV haldol.  HD stable  eICU Interventions  Plan: 10 mg IV haldol times one dose. Nurse to contact primary team and inform them of patient's ongoing agitation and current interventions.     Intervention Category Major Interventions: Delirium, psychosis, severe agitation - evaluation and management  DETERDING,ELIZABETH 02/11/2015, 1:45 AM

## 2015-02-11 NOTE — Progress Notes (Signed)
Pt refused drinking oral bowel prep as ordered, she drank about a cup and half of the Golytely, refused drinking it since about 10pm, pt states " you can't make me drink that, i don't like how it makes my belly feel". Pt has been having increasing agitation and confusion thru the shift, she is oriented to self but intermitently oriented to place and time. About 0030, pt got very agitated, pulling off her telemetry leads, iv line and clothing. Pt also had a large bowel movement and vehemently refused to be cleaned, while striking out on staff. [facility] night coverage NP, ordered 0.25mg  Lorazepam - pt knocked it off RN's hands. E-link MD looked in via camera and 5mg  Haldol was ordered. Pt seem calm after receiving 5mg  Haldol, currently in bed with eyes closed. Dr. Oletta Lamas (on-call MD for Dr. Paulita Fujita) was made aware of patient's refusal to drink the bowel prep. No further instructions was received at this time.

## 2015-02-11 NOTE — Progress Notes (Signed)
[facility] floor coverage (M. York, PA) paged three times, no response received. [place] [facility] page in order to get hold of someone for Hospitalist, was made aware that M. York, [place] pager was faulty. The floor coverage hospitallist called me and was updated on patient status.

## 2015-02-11 NOTE — Progress Notes (Signed)
[place] Progress Note Patient Name: Cullen Vanallen DOB: July 16, 1923 MRN: [id number]   Date of Service  02/11/2015  HPI/Events of Note  14 F admitted with CML and AF/RVR now with severe agitation.  HD stable with QTc of less than 500.  eICU Interventions  Plan: 5 mg Haldol IV times one. Nurse to contact [facility] if no response to haldol in 30 minutes     Intervention Category Major Interventions: Delirium, psychosis, severe agitation - evaluation and management  Trudi Morgenthaler 02/11/2015, 12:32 AM

## 2015-02-11 NOTE — Care Management Note (Signed)
Case Management Note  Patient Details  Name: [name] MRN: [id number] Date of Birth: 1922/12/24  Subjective/Objective:               A. Fib with rvr  Domenic Moras abuse   Action/Plan: tbd   Expected Discharge Date:   (unknown)               Expected Discharge Plan:  Home/Self Care  In-House Referral:  Clinical Social Work  Discharge planning Services  CM Consult  Post Acute Care Choice:  NA Choice offered to:  NA  DME Arranged:  N/A DME Agency:  NA  HH Arranged:  NA HH Agency:  NA  Status of Service:  In process, will continue to follow  Medicare Important Message Given:    Date Medicare IM Given:    Medicare IM give by:    Date Additional Medicare IM Given:    Additional Medicare Important Message give by:     If discussed at [place] of Stay Meetings, dates discussed:    Additional Comments:  Leeroy Cha, RN 02/11/2015, 11:53 AM

## 2015-02-12 ENCOUNTER — Inpatient Hospital Stay (HOSPITAL_COMMUNITY): Payer: Medicare Other

## 2015-02-12 DIAGNOSIS — R197 Diarrhea, unspecified: Secondary | ICD-10-CM

## 2015-02-12 DIAGNOSIS — I481 Persistent atrial fibrillation: Principal | ICD-10-CM

## 2015-02-12 LAB — COMPREHENSIVE METABOLIC PANEL
ALBUMIN: 2.8 g/dL — AB (ref 3.5–5.0)
ALK PHOS: 63 U/L (ref 38–126)
ALT: 12 U/L — AB (ref 14–54)
AST: 22 U/L (ref 15–41)
Anion gap: 5 (ref 5–15)
BUN: 9 mg/dL (ref 6–20)
CALCIUM: 7.6 mg/dL — AB (ref 8.9–10.3)
CO2: 19 mmol/L — ABNORMAL LOW (ref 22–32)
Chloride: 111 mmol/L (ref 101–111)
Creatinine, Ser: 0.85 mg/dL (ref 0.44–1.00)
GFR calc Af Amer: >60 mL/min (ref >60)
GFR calc non Af Amer: 59 mL/min — ABNORMAL LOW (ref >60)
Glucose, Bld: 114 mg/dL — ABNORMAL HIGH (ref 65–99)
POTASSIUM: 3.9 mmol/L (ref 3.5–5.1)
SODIUM: 135 mmol/L (ref 135–145)
TOTAL PROTEIN: 5.4 g/dL — AB (ref 6.5–8.1)
Total Bilirubin: 0.5 mg/dL (ref 0.3–1.2)

## 2015-02-12 LAB — CBC WITH DIFFERENTIAL/PLATELET
Basophils Absolute: 0 10*3/uL (ref 0.0–0.1)
Basophils Absolute: 0 10*3/uL (ref 0.0–0.1)
Basophils Relative: 0 % (ref 0–1)
Basophils Relative: 0 % (ref 0–1)
EOS PCT: 1 % (ref 0–5)
Eosinophils Absolute: 0.3 10*3/uL (ref 0.0–0.7)
Eosinophils Absolute: 0.4 10*3/uL (ref 0.0–0.7)
Eosinophils Relative: 1 % (ref 0–5)
HCT: 38.6 % (ref 36.0–46.0)
HEMATOCRIT: 40.3 % (ref 36.0–46.0)
Hemoglobin: 12.3 g/dL (ref 12.0–15.0)
Hemoglobin: 12.4 g/dL (ref 12.0–15.0)
LYMPHS ABS: 1.1 10*3/uL (ref 0.7–4.0)
Lymphocytes Relative: 4 % — ABNORMAL LOW (ref 12–46)
Lymphocytes Relative: 4 % — ABNORMAL LOW (ref 12–46)
Lymphs Abs: 1.4 10*3/uL (ref 0.7–4.0)
MCH: 25 pg — AB (ref 26.0–34.0)
MCH: 24 pg — AB (ref 26.0–34.0)
MCHC: 31.9 g/dL (ref 30.0–36.0)
MCHC: 30.8 g/dL (ref 30.0–36.0)
MCV: 78.5 fL (ref 78.0–100.0)
MCV: 78.1 fL (ref 78.0–100.0)
Monocytes Absolute: 1.4 10*3/uL — ABNORMAL HIGH (ref 0.1–1.0)
Monocytes Absolute: 1.4 10*3/uL — ABNORMAL HIGH (ref 0.1–1.0)
Monocytes Relative: 5 % (ref 3–12)
Monocytes Relative: 4 % (ref 3–12)
NEUTROS ABS: 24.6 10*3/uL — AB (ref 1.7–7.7)
Neutro Abs: 32.7 10*3/uL — ABNORMAL HIGH (ref 1.7–7.7)
Neutrophils Relative %: 90 % — ABNORMAL HIGH (ref 43–77)
Neutrophils Relative %: 91 % — ABNORMAL HIGH (ref 43–77)
Platelets: 357 10*3/uL (ref 150–400)
Platelets: 414 10*3/uL — ABNORMAL HIGH (ref 150–400)
RBC: 4.92 MIL/uL (ref 3.87–5.11)
RBC: 5.16 MIL/uL — AB (ref 3.87–5.11)
RDW: 19.4 % — AB (ref 11.5–15.5)
RDW: 19.7 % — ABNORMAL HIGH (ref 11.5–15.5)
WBC: 27.4 10*3/uL — AB (ref 4.0–10.5)
WBC: 35.9 10*3/uL — ABNORMAL HIGH (ref 4.0–10.5)

## 2015-02-12 LAB — DIGOXIN LEVEL: DIGOXIN LVL: 0.7 ng/mL — AB (ref 0.8–2.0)

## 2015-02-12 MED ORDER — LORAZEPAM 2 MG/ML IJ SOLN: 0.2500 mg | Freq: Once | INTRAMUSCULAR | Status: DC

## 2015-02-12 MED ORDER — SORBITOL 70 % SOLN
960.0000 mL | TOPICAL_OIL | Freq: Every morning | ORAL | Status: DC
  Administered 2015-02-12: 960 mL via RECTAL
  Filled 2015-02-12 (×3): qty 240

## 2015-02-12 MED ORDER — DILTIAZEM HCL ER COATED BEADS 120 MG PO CP24
120.0000 mg | ORAL_CAPSULE | Freq: Every day | ORAL | Status: DC
  Administered 2015-02-12: 120 mg via ORAL
  Filled 2015-02-12: qty 1

## 2015-02-12 NOTE — Progress Notes (Signed)
[facility] PROGRESS NOTE  [name] IRW:[id number] DOB: 10/29/1922 DOA: 02/09/2015 PCP: Paula Amel, MD  HPI/Subjective:  81 y/o ? Afib Chad2Vasc2 ~5 not on AC [2/2 to poor compliance and INR 10 at some point in the past] nor Rate controlling agent, CMLpreviously seen by 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  She was admitted to step down for atrial fibrillation and constipation.   e had heme + brown stool on rectal exam and c. Diff negative.  Her relatives have indicated that she has become increasingly forgetful with a slight slowing of mentation over the past 6 months.    Subjective:  Confused Cannot tell me where she is in terms of place Cannot tell me other details Knows she is in [place] however Ple[place]  no other report overnight from RN  Assessment/Plan:  1. Atrial fibrillation -Chad2vasc2 score is 4 - she has not previously been on a rate controlling agent or anticoagulant - 1 prior admission where she had to be cardioverted -Cardizem drip conversion as per Cardiology to 120 Cardizem CD 02/12/15 -Echo 60-65% EF with no wall motion abnormalities - IV heparin GTT-d/c per cardiology 7/22 - Some memory loss may be due to microvascular insults secondary to A.fib -Cardiology consulted appreciated - Dig added-Dig level 7/22=0.7 -Only ASA for Afib per cardiology -Stable for floor trasnfer  Toxic Metabolic Encephalopathy - MMSE demonstrated 27/30 -Overnight, probable delirium secondary to a 1st generation antihistamine benadryl [had a lot of ithcing] - Seems back to baseline  Acute neutrophilic dermatosis/Sweet syndrome -Stable at present time -RA factor  elevated in the past 11/2010 might be in keeping with this - Itching/rash with skin dryness - Sarna lotion PRN - Discontinue benadryl due to AMS earlier in admission  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 [name] on phone 7/22 who recs OP work-up and feels that this is mor elikely a leukemoid reaction and can be worked up PRN OP  Chronic diastolic heart failure, EF this admit 60-65% - Currently stable, no signs of fluid overload - Cardiology to follow   Constipation - functional - Passed loose stool last night after drinking Sorbitol, no blood in stool - Unlikely infectious process - Continue Lactulose/sorbitol scheduled till regular stools - GI has been consulted by the emergency room regarding stricture in the  - Patient has never had a colonoscopy - Scheduled for colonoscopy 02/11/15- did not complete bowel prep due to AMS - Could do flex SIg? Defer to GI  Microcytic anemia -h/o of Sweet syndrome as well as CML  Asymptomatic bacteriuria - No dysuria or fever - Discontinued antibiotic after ED dose, no signs of infection and patient is asymptomatic  Inadequate oral intake - Likely due to lethargy/confision per patient and family, patient also reports decreased appetite  - Consulted nutrition - Ensure per their recommendation, monitor daily weight   ILD on prior steroids -Monitor -Poor candidate for Amiodarone for rate control  Code Status: Full Family Communication: Patient and 2 daughters in room Disposition Plan: Remain inpatient--transfer to  tele 7/23  Consultants:  Cardiology  GI  Procedures: Colonoscopy originally scheduled for 02/11/15, cancelled due to AMS last night and patient did not complete bowel prep.  Antibiotics:  Cipro  02/09/15 >> 02/09/15  Flagyl 02/09/15 >> 02/09/15    Objective: Filed Vitals:   02/12/15 0500  BP: 126/80  Pulse: 92  Temp:   Resp: 19    Intake/Output Summary (Last 24 hours) at 02/12/15 0800 Last data filed at 02/12/15 0600  Gross per 24 hour  Intake 1834.84 ml  Output      0 ml  Net 1834.84 ml   Filed Weights   02/09/15 1800 02/11/15 1108 02/12/15 0400  Weight: 51.1 kg (112 lb 10.5 oz) 53.5 kg (117 lb 15.1 oz) 54.9 kg (121 lb 0.5 oz)    Exam:   General:  Patient seated comfortably in bed in no distress, is alert, oriented and pleasant. Was in soft restraints due to AMS last night, but these have now been removed.  Cardiovascular: Irregular rate, no m/r/g, no LE edema  Musculoskeletal: Grossly normal tone Neuro: oreitned x 1   Data Reviewed: Basic Metabolic Panel:  Recent Labs Lab 02/09/15 1220 02/09/15 1845 02/10/15 0340 02/11/15 0733 02/12/15 0344  NA 136  --  135 135 135  K 4.3  --  4.3 4.2 3.9  CL 107  --  108 108 111  CO2 21*  --  19* 19* 19*  GLUCOSE 110*  --  92 109* 114*  BUN 35*  --  21* 13 9  CREATININE 0.96  --  0.78 0.84 0.85  CALCIUM 8.9  --  8.2* 8.3* 7.6*  MG  --  1.8  --   --   --    Liver Function Tests:  Recent Labs Lab 02/09/15 1220 02/11/15 0733 02/12/15 0344  AST 27 31 22   ALT 13* 14 12*  ALKPHOS 67 67 63  BILITOT 0.8 0.7 0.5  PROT 7.2 6.1* 5.4*  ALBUMIN 3.8 3.3* 2.8*    Recent Labs Lab 02/09/15 1220  LIPASE 23   No results for input(s): AMMONIA in the last 168 hours. CBC:  Recent Labs Lab 02/09/15 1220 02/10/15 0340 02/11/15 0730 02/12/15 0344  WBC 31.8* 23.0* 29.3* 27.4*  NEUTROABS  --   --  25.7* 24.6*  HGB 12.6 11.0* 12.3 12.3  HCT 40.1 36.3 40.4 38.6  MCV 78.3 78.1 81.1 78.5  PLT 287 211 336 357    Recent Results (from the past 240 hour(s))  Clostridium Difficile by PCR (not at [facility])     Status: None   Collection Time: 02/09/15  2:21 PM  Result Value Ref Range Status   C difficile by pcr  NEGATIVE NEGATIVE Final  MRSA PCR Screening     Status: None   Collection Time: 02/09/15  5:55 PM  Result Value Ref Range Status   MRSA by PCR NEGATIVE NEGATIVE Final    Comment:        The GeneXpert MRSA Assay (FDA approved for NASAL specimens only), is one component of a comprehensive MRSA colonization surveillance program. It is not intended to diagnose MRSA infection nor to guide or monitor treatment for MRSA infections.      Studies: No results found.  Scheduled Meds: . aspirin  81 mg Oral Daily  . digoxin  0.125 mg Oral Daily  . feeding supplement (ENSURE ENLIVE)  237 mL Oral q morning - 10a  . sorbitol  30 mL Oral Daily   Continuous Infusions: . sodium  chloride 75 mL/hr at 02/11/15 2125  . diltiazem (CARDIZEM) infusion 10 mg/hr (02/12/15 6859)    Principal Problem:   Atrial fibrillation Active Problems:   Acute neutrophilic dermatosis   Chronic myelocytic leukemia   Chronic diastolic heart failure   UTI (lower urinary tract infection)   Constipation - functional   Microcytic anemia   Asymptomatic bacteriuria   New onset a-fib   Chronic atrial fibrillation   Atrial fibrillation with RVR   Leukocytosis   Rectal stricture  Time spent: 15 Verneita Griffes, MD [facility] (P) [phone]

## 2015-02-12 NOTE — Progress Notes (Signed)
Patient Name: [name] Date of Encounter: 02/12/2015  Primary Cardiologist: previously Dr. Percival Spanish, lost to followup since June 2012   Principal Problem:   Atrial fibrillation Active Problems:   Acute neutrophilic dermatosis   Chronic myelocytic leukemia   Chronic diastolic heart failure   UTI (lower urinary tract infection)   Constipation - functional   Microcytic anemia   Asymptomatic bacteriuria   New onset a-fib   Chronic atrial fibrillation   Atrial fibrillation with RVR   Leukocytosis   Rectal stricture    SUBJECTIVE  Sound asleep during my interview. Sitter at bedside, tied to the bed. Per nursing note, combative last night and confused, given haldol and benzo. Had a fall yesterday afternoon as well.   CURRENT MEDS . aspirin  81 mg Oral Daily  . digoxin  0.125 mg Oral Daily  . feeding supplement (ENSURE ENLIVE)  237 mL Oral q morning - 10a  . sorbitol  30 mL Oral Daily    OBJECTIVE  Filed Vitals:   02/12/15 0100 02/12/15 0300 02/12/15 0400 02/12/15 0500  BP: 117/40 107/48  126/80  Pulse: 118 105  92  Temp:   98.2 F (36.8 C)   TempSrc:   Oral   Resp: 25 21  19   Height:      Weight:   54.9 kg (121 lb 0.5 oz)   SpO2: 96% 99%  95%    Intake/Output Summary (Last 24 hours) at 02/12/15 0819 Last data filed at 02/12/15 0600  Gross per 24 hour  Intake 1834.84 ml  Output      0 ml  Net 1834.84 ml   Filed Weights   02/09/15 1800 02/11/15 1108 02/12/15 0400  Weight: 51.1 kg (112 lb 10.5 oz) 53.5 kg (117 lb 15.1 oz) 54.9 kg (121 lb 0.5 oz)    PHYSICAL EXAM  General: Asleep, sitter at bedside, tied to the bed. HEENT:  Normal  Neck: Supple without bruits or JVD. Lungs:  Resp regular and unlabored. Difficult to assess anteriorly with loud snore.  Heart: irregular, tachycardic. no s3, s4, or murmurs. Abdomen: Soft, non-distended, BS + x 4.  Extremities: No clubbing, cyanosis or edema. Wrist tied to the bedside  Accessory Clinical  Findings  CBC  Recent Labs  02/11/15 0730 02/12/15 0344  WBC 29.3* 27.4*  NEUTROABS 25.7* 24.6*  HGB 12.3 12.3  HCT 40.4 38.6  MCV 81.1 78.5  PLT 336 790   Basic Metabolic Panel  Recent Labs  02/09/15 1845  02/11/15 0733 02/12/15 0344  NA  --   < > 135 135  K  --   < > 4.2 3.9  CL  --   < > 108 111  CO2  --   < > 19* 19*  GLUCOSE  --   < > 109* 114*  BUN  --   < > 13 9  CREATININE  --   < > 0.84 0.85  CALCIUM  --   < > 8.3* 7.6*  MG 1.8  --   --   --   < > = values in this interval not displayed. Liver Function Tests  Recent Labs  02/11/15 0733 02/12/15 0344  AST 31 22  ALT 14 12*  ALKPHOS 67 63  BILITOT 0.7 0.5  PROT 6.1* 5.4*  ALBUMIN 3.3* 2.8*    Recent Labs  02/09/15 1220  LIPASE 23   Thyroid Function Tests  Recent Labs  02/09/15 1845  TSH 4.298    TELE A-fib and  a-fib with HR 90-110s most of the time, however had frequent jump into 130s    ECG  No new EKG  Echocardiogram 02/10/2015  LV EF: 60% -  65%  ------------------------------------------------------------------- Indications:   Atrial fibrillation - 427.31.  ------------------------------------------------------------------- History:  PMH: Microcytic Anemia. Chronic Myelocytic Leukemia. Lower Extremity Edema. Thrombocytosis. Congestive heart failure.  ------------------------------------------------------------------- Study Conclusions  - Left ventricle: The cavity size was normal. Systolic function was normal. The estimated ejection fraction was in the range of 60% to 65%. Wall motion was normal; there were no regional wall motion abnormalities. - Mitral valve: Moderately calcified annulus. - Pulmonary arteries: Systolic pressure was mildly increased. PA peak pressure: 39 mm Hg (S).    Radiology/Studies  Ct Abdomen Pelvis W Contrast  02/09/2015   CLINICAL DATA:  81 year old female with a history of constipation for 8 days.  EXAM: CT ABDOMEN AND  PELVIS WITH CONTRAST  TECHNIQUE: Multidetector CT imaging of the abdomen and pelvis was performed using the standard protocol following bolus administration of intravenous contrast.  CONTRAST:  23mL OMNIPAQUE IOHEXOL 300 MG/ML  SOLN  COMPARISON:  None.  FINDINGS: Lower chest:  Unremarkable appearance of the soft tissues of the chest wall.  Heart size within normal limits.  No pericardial fluid/thickening.  No lower mediastinal adenopathy.  Unremarkable appearance of the distal esophagus.  No hiatal hernia.  No confluent airspace disease, pleural fluid, or pneumothorax within visualized lung. Chronic changes of the lung bases.  Abdomen/pelvis:  Unremarkable appearance of liver and spleen.  Unremarkable appearance of bilateral adrenal glands.  No peripancreatic or pericholecystic fluid or inflammatory changes.  No radio-opaque gallstones.  No intrahepatic or extrahepatic biliary ductal dilatation.  No intra-peritoneal free air or significant free-fluid.  Very large stool burden with formed stool throughout the length of the colon. Formed stool involves the rectum. There is a narrowed segment of rectum. Hyper enhancement of the mucosa of the rectal wall with inflammatory changes and the meso rectum. No evidence of focal fluid collection or abscess. Thickening of the fascia planes within the anatomic pelvis.  Formed stool/fecalization of the distal small bowel.  No free air or significant free fluid within the abdomen.  Right Kidney/Ureter:  No hydronephrosis. No nephrolithiasis. No perinephric stranding. Unremarkable course of the right ureter.  Left Kidney/Ureter:  No hydronephrosis. No nephrolithiasis. No perinephric stranding.  Unremarkable course of the left ureter.  Unremarkable appearance of the urinary bladder.  Surgical changes of hysterectomy.  No significant vascular calcification. No aneurysm or periaortic fluid identified.  Musculoskeletal:  No displaced fracture identified.  Advanced degenerative changes  of the visualized spine. Compression fracture of L2 and L4 of indeterminate age. Each of these levels demonstrates approximately 30% height loss.  Osteopenia.  No displaced fracture identified.  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 Recommend correlation with a history of obstipation/constipation. No evidence of perforation or abscess.  There is a narrowed segment/stricture of the rectum, just above the anorectal junction. Correlation with colonoscopy is recommended to rule out either a benign or malignant stricture.  Signed,  Dulcy Fanny. Earleen Newport, DO  [facility]  [facility]   Electronically Signed   By: Corrie Mckusick D.O.   On: 02/09/2015 16:06    ASSESSMENT AND PLAN  1. Permanent atrial fibrillation/aflutter with rapid ventricular response  - CHA2DS2-VASC score of 4 (female, age, HF).  This patients CHA2DS2-VASc Score and unadjusted Ischemic Stroke Rate (% per year) is equal to 4.8 % stroke rate/year from a score of 4 - she was taken off of coumadin in June 2012 as she has trouble remembering her medication and had INR of 10.1 at one point. Given her advance age, positive hemacult and mental status, would start on ASA only. Not good candidate for any systemic anticoagulation  I would continue with rate control . Change Dilt to PO 120 a day  Continue digoxin at current dose. HR is 90-100   2. Chronic diastolic HF in the setting of rapid a-fib - Echo 02/10/2015 EF 60-65%, no RWMA, moderately calcified mitral annulus, PA peak pressure 45mmHg   Will sign off. Call for questions     Lubna Stegeman, Wonda Cheng, MD  02/12/2015 8:27 AM    [place] [address],  [place], [place]  [address] Pager [phone] Phone: [phone]; Fax:  [phone]   [facility]  [address] [place], [place]  [address] [phone]   Fax [phone]

## 2015-02-12 NOTE — Progress Notes (Signed)
Subjective: Agitation/confusion much improved. Little stool output (some liquid).  Objective: Vital signs in last 24 hours: Temp:  [97.6 F (36.4 C)-98.6 F (37 C)] 97.7 F (36.5 C) (07/23 0800) Pulse Rate:  [65-147] 107 (07/23 1021) Resp:  [19-28] 28 (07/23 0800) BP: (88-130)/(40-80) 115/57 mmHg (07/23 1022) SpO2:  [89 %-100 %] 94 % (07/23 0800) Weight:  [54.9 kg (121 lb 0.5 oz)] 54.9 kg (121 lb 0.5 oz) (07/23 0400) Weight change:  Last BM Date: 02/11/15  PE: GEN:  Elderly, much more alert, can answer questions appropriately ABD:  Moderate distention with tympany, bowel sounds present, non-tender  Lab Results: CBC    Component Value Date/Time   WBC 27.4* 02/12/2015 0344   RBC 4.92 02/12/2015 0344   HGB 12.3 02/12/2015 0344   HCT 38.6 02/12/2015 0344   PLT 357 02/12/2015 0344   MCV 78.5 02/12/2015 0344   MCH 25.0* 02/12/2015 0344   MCHC 31.9 02/12/2015 0344   RDW 19.4* 02/12/2015 0344   LYMPHSABS 1.1 02/12/2015 0344   MONOABS 1.4* 02/12/2015 0344   EOSABS 0.3 02/12/2015 0344   BASOSABS 0.0 02/12/2015 0344   CMP     Component Value Date/Time   NA 135 02/12/2015 0344   K 3.9 02/12/2015 0344   CL 111 02/12/2015 0344   CO2 19* 02/12/2015 0344   GLUCOSE 114* 02/12/2015 0344   BUN 9 02/12/2015 0344   CREATININE 0.85 02/12/2015 0344   CALCIUM 7.6* 02/12/2015 0344   PROT 5.4* 02/12/2015 0344   ALBUMIN 2.8* 02/12/2015 0344   AST 22 02/12/2015 0344   ALT 12* 02/12/2015 0344   ALKPHOS 63 02/12/2015 0344   BILITOT 0.5 02/12/2015 0344   GFRNONAA 59* 02/12/2015 0344   GFRAA >60 02/12/2015 0344   Assessment:  1.  Agitation/confusion.  Possible sundowning or medication effect.  Seems largely resolved. 2.  Constipation/obstipation.  Persistent.  Unable to do much yesterday given her agitation/confusion. 3.  Abnormal CT abdomen, proctitis and possible distal rectal stricture.  Plan:  1.  Patient did not do well with slow trial of GoLytely.  So, will continue sorbitol  orally. 2.  SMOG enemas daily today and Sunday, with admixed attempts at manual disimpaction. 3.  Clear liquids only for now. 4.  Hopefully can get her evacuated enough over weekend to try sigmoidoscopy Monday. 5.  Discussed case with one of patient's nieces, who is at bedside. 6.  Will follow.   Landry Dyke 02/12/2015, 11:55 AM   Pager [phone] If no answer or after 5 PM call [phone]

## 2015-02-13 LAB — CBC
HEMATOCRIT: 39.9 % (ref 36.0–46.0)
Hemoglobin: 12.5 g/dL (ref 12.0–15.0)
MCH: 24.8 pg — AB (ref 26.0–34.0)
MCHC: 31.3 g/dL (ref 30.0–36.0)
MCV: 79 fL (ref 78.0–100.0)
Platelets: 359 10*3/uL (ref 150–400)
RBC: 5.05 MIL/uL (ref 3.87–5.11)
RDW: 19.7 % — AB (ref 11.5–15.5)
WBC: 37.1 10*3/uL — ABNORMAL HIGH (ref 4.0–10.5)

## 2015-02-13 LAB — COMPREHENSIVE METABOLIC PANEL
ALT: 12 U/L — AB (ref 14–54)
AST: 24 U/L (ref 15–41)
Albumin: 2.5 g/dL — ABNORMAL LOW (ref 3.5–5.0)
Alkaline Phosphatase: 61 U/L (ref 38–126)
Anion gap: 8 (ref 5–15)
BILIRUBIN TOTAL: 0.8 mg/dL (ref 0.3–1.2)
BUN: 15 mg/dL (ref 6–20)
CO2: 18 mmol/L — AB (ref 22–32)
Calcium: 7.9 mg/dL — ABNORMAL LOW (ref 8.9–10.3)
Chloride: 108 mmol/L (ref 101–111)
Creatinine, Ser: 0.97 mg/dL (ref 0.44–1.00)
GFR calc Af Amer: 58 mL/min — ABNORMAL LOW (ref >60)
GFR, EST NON AFRICAN AMERICAN: 50 mL/min — AB (ref >60)
Glucose, Bld: 131 mg/dL — ABNORMAL HIGH (ref 65–99)
POTASSIUM: 4 mmol/L (ref 3.5–5.1)
SODIUM: 134 mmol/L — AB (ref 135–145)
Total Protein: 5.2 g/dL — ABNORMAL LOW (ref 6.5–8.1)

## 2015-02-13 MED ORDER — POLYETHYLENE GLYCOL 3350 17 G PO PACK
17.0000 g | PACK | Freq: Two times a day (BID) | ORAL | Status: DC
  Administered 2015-02-13 – 2015-02-15 (×3): 17 g via ORAL
  Filled 2015-02-13 (×7): qty 1

## 2015-02-13 MED ORDER — DILTIAZEM HCL 30 MG PO TABS
30.0000 mg | ORAL_TABLET | Freq: Once | ORAL | Status: AC
  Administered 2015-02-13: 30 mg via ORAL
  Filled 2015-02-13: qty 1

## 2015-02-13 MED ORDER — SODIUM CHLORIDE 0.9 % IV SOLN
INTRAVENOUS | Status: DC
  Administered 2015-02-14 – 2015-02-17 (×7): via INTRAVENOUS

## 2015-02-13 MED ORDER — DILTIAZEM HCL ER COATED BEADS 180 MG PO CP24: 180.0000 mg | ORAL_CAPSULE | Freq: Every day | ORAL | Status: DC

## 2015-02-13 MED ORDER — BISACODYL 10 MG RE SUPP
10.0000 mg | Freq: Once | RECTAL | Status: AC
  Administered 2015-02-13: 10 mg via RECTAL
  Filled 2015-02-13: qty 1

## 2015-02-13 MED ORDER — DEXTROSE 5 % IV SOLN
1.0000 g | Freq: Every day | INTRAVENOUS | Status: DC
  Administered 2015-02-13 – 2015-02-15 (×3): 1 g via INTRAVENOUS
  Filled 2015-02-13 (×3): qty 10

## 2015-02-13 MED ORDER — DILTIAZEM HCL ER COATED BEADS 180 MG PO CP24
180.0000 mg | ORAL_CAPSULE | Freq: Every day | ORAL | Status: DC
Start: 2015-02-13 — End: 2015-02-15
  Administered 2015-02-13 – 2015-02-14 (×2): 180 mg via ORAL
  Filled 2015-02-13 (×2): qty 1

## 2015-02-13 MED ORDER — ACETAMINOPHEN 325 MG PO TABS
650.0000 mg | ORAL_TABLET | Freq: Four times a day (QID) | ORAL | Status: DC | PRN
  Administered 2015-02-13: 650 mg via ORAL
  Filled 2015-02-13: qty 2

## 2015-02-13 MED ORDER — ENOXAPARIN SODIUM 40 MG/0.4ML ~~LOC~~ SOLN
40.0000 mg | SUBCUTANEOUS | Status: DC
Start: 2015-02-13 — End: 2015-02-19
  Administered 2015-02-14 – 2015-02-18 (×5): 40 mg via SUBCUTANEOUS
  Filled 2015-02-13 (×6): qty 0.4

## 2015-02-13 NOTE — Progress Notes (Signed)
Pharmacy will sign-off Rocephin protocol since dose-adjustments are not likely to be necessary.   Romeo Rabon, PharmD, pager [phone]. 02/13/2015,12:21 PM.

## 2015-02-13 NOTE — Progress Notes (Signed)
[facility] PROGRESS NOTE  [name] BZJ:[id number] DOB: [birth year] DOA: 02/09/2015 PCP: [name], MD  HPI/Subjective:  81 y/o ? Afib Chad2Vasc2 ~5 not on AC [2/2 to poor compliance and INR 10 at some point in the past] nor Rate controlling agent, CMLpreviously seen by 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  She was admitted to step down for atrial fibrillation and constipation.   She had heme + brown stool on rectal exam and c. Diff negative.  Her relatives have indicated that she has become increasingly forgetful with a slight slowing of mentation over the past 6 months.   Found to have presumptive pyelonephritis on 7/23 and retention of urine and treated for this Transfer to the floor 7/24     Subjective:  More oriented and alert Tolerating clear liquids but is hungry No chest pain Nursing reports MAXIMUM TEMPERATURE 101 this a.m. Empiric antibiotics started for presumed pyelonephritis  no further dark stools   Assessment/Plan:    pyelonephritis Patient had acute urinary retention on 7/23 -Indwelling catheter placed 7/23 for urine retention above 1000 cc -Continue empiric Rocephin    Atrial fibrillation -Chad2vasc2 score is 4 - she has not previously been on a rate controlling agent or anticoagulant - 1 prior admission where she had to be cardioverted -Cardizem drip conversion as per Cardiology to 120 Cardizem CD 02/12/15 -Echo 60-65% EF with no wall motion abnormalities - IV heparin GTT-d/c per cardiology 7/22 - Some memory loss may be due to microvascular insults secondary to A.fib -Cardiology consulted appreciated - Dig added-Dig level  7/22=0.7 -Only ASA for Afib per cardiology  Toxic Metabolic Encephalopathy - MMSE demonstrated 27/30 -Overnight 7/22, probable delirium secondary to a 1st generation antihistamine benadryl [had a lot of ithcing] - Seems back to baseline  Acute neutrophilic dermatosis/Sweet syndrome -Stable at present time -RA factor elevated in the past 11/2010 might be in keeping with this - Itching/rash with skin dryness - Sarna lotion PRN - Discontinue benadryl due to AMS earlier in admission  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 [name] on phone 7/22 who recs OP work-up and feels that this is more likely a leukemoid reaction and can be worked up PRN OP -Leukocytosis currently 7/24 explained by pyelonephritis. Monitor  Chronic diastolic heart failure, EF this admit 60-65% - Currently stable, no signs of fluid overload - Cardiology to follow   Constipation - functional - Passed loose stool last night after drinking Sorbitol, no blood in stool - Unlikely infectious process - Continue Lactulose/sorbitol scheduled till regular stools - GI has been consulted by the emergency room regarding stricture in the  - Patient has never had a colonoscopy - Scheduled for colonoscopy 02/11/15- did not complete bowel prep due to AMS - Could do flex SIg? Defer to GI  Microcytic anemia -h/o of Sweet syndrome as well as CML  Inadequate oral intake - Likely due to lethargy/confision per patient and family, patient also reports decreased appetite  - Consulted nutrition - Ensure per their recommendation, monitor daily  weight   ILD on prior steroids -Monitor -Poor candidate for Amiodarone for rate control  Code Status: Full Family Communication: Patient as well as niece updated  fully Disposition Plan: Remain inpatient--transfer to tele 7/23  Consultants:  Cardiology  GI  Procedures: Colonoscopy originally scheduled for 02/11/15, cancelled due to AMS l  Antibiotics:  Cipro 02/09/15 >> 02/09/15  Flagyl 02/09/15 >> 02/09/15  Rocephin 7/24    Objective: Filed Vitals:   02/13/15 0855  BP: 135/81  Pulse: 138  Temp: 97.8 F (36.6 C)  Resp: 31    Intake/Output Summary (Last 24 hours) at 02/13/15 1001 Last data filed at 02/13/15 0710  Gross per 24 hour  Intake 1362.5 ml  Output   1175 ml  Net  187.5 ml   Filed Weights   02/11/15 1108 02/12/15 0400 02/13/15 0430  Weight: 53.5 kg (117 lb 15.1 oz) 54.9 kg (121 lb 0.5 oz) 53.9 kg (118 lb 13.3 oz)    Exam:   General:  Patient seated comfortably in bed in no distress, is alert, oriented and pleasant. Was in soft restraints due to AMS last night, but these have now been removed.  Cardiovascular: Irregular rate, no m/r/g, no LE edema  Musculoskeletal: Grossly normal tone Neuro: oreitned x 1   Data Reviewed: Basic Metabolic Panel:  Recent Labs Lab 02/09/15 1220 02/09/15 1845 02/10/15 0340 02/11/15 0733 02/12/15 0344 02/13/15 0415  NA 136  --  135 135 135 134*  K 4.3  --  4.3 4.2 3.9 4.0  CL 107  --  108 108 111 108  CO2 21*  --  19* 19* 19* 18*  GLUCOSE 110*  --  92 109* 114* 131*  BUN 35*  --  21* 13 9 15   CREATININE 0.96  --  0.78 0.84 0.85 0.97  CALCIUM 8.9  --  8.2* 8.3* 7.6* 7.9*  MG  --  1.8  --   --   --   --    Liver Function Tests:  Recent Labs Lab 02/09/15 1220 02/11/15 0733 02/12/15 0344 02/13/15 0415  AST 27 31 22 24   ALT 13* 14 12* 12*  ALKPHOS 67 67 63 61  BILITOT 0.8 0.7 0.5 0.8  PROT 7.2 6.1* 5.4* 5.2*  ALBUMIN 3.8 3.3* 2.8* 2.5*    Recent Labs Lab 02/09/15 1220  LIPASE 23   No results for input(s): AMMONIA in the last 168 hours. CBC:  Recent Labs Lab 02/10/15 0340 02/11/15 0730 02/12/15 0344 02/12/15 1740 02/13/15 0415  WBC 23.0* 29.3*  27.4* 35.9* 37.1*  NEUTROABS  --  25.7* 24.6* 32.7*  --   HGB 11.0* 12.3 12.3 12.4 12.5  HCT 36.3 40.4 38.6 40.3 39.9  MCV 78.1 81.1 78.5 78.1 79.0  PLT 211 336 357 414* 359    Recent Results (from the past 240 hour(s))  Clostridium Difficile by PCR (not at [facility])     Status: None   Collection Time: 02/09/15  2:21 PM  Result Value Ref Range Status   C difficile by pcr NEGATIVE NEGATIVE Final  MRSA PCR Screening     Status: None   Collection Time: 02/09/15  5:55 PM  Result Value Ref Range Status   MRSA by PCR NEGATIVE NEGATIVE Final    Comment:        The GeneXpert MRSA Assay (FDA approved for NASAL specimens only), is one component of a comprehensive MRSA colonization surveillance program. It is not intended to diagnose MRSA infection nor to guide or monitor treatment  for MRSA infections.      Studies: Dg Chest Port 1 View  02/12/2015   CLINICAL DATA:  Three day history of cough, shortness of breath and generalized weakness. Current history of chronic diastolic heart failure.  EXAM: PORTABLE CHEST - 1 VIEW  COMPARISON:  01/21/2011 and earlier.  FINDINGS: Cardiac silhouette moderately enlarged, increased in size since the prior examination. Thoracic aorta atherosclerotic and mildly tortuous, unchanged. Hilar and mediastinal contours otherwise unremarkable. Emphysematous changes throughout both lungs. Pulmonary venous hypertension with minimal interstitial pulmonary edema as there are scattered Kerley B-lines. Suboptimal inspiration with mild atelectasis in the lung bases, left greater than right.  IMPRESSION: 1. Mild CHF superimposed upon baseline COPD/emphysema. 2. Suboptimal inspiration accounts for mild bibasilar atelectasis.   Electronically Signed   By: Evangeline Dakin M.D.   On: 02/12/2015 18:36    Scheduled Meds: . aspirin  81 mg Oral Daily  . cefTRIAXone (ROCEPHIN)  IV  1 g Intravenous Q0600  . digoxin  0.125 mg Oral Daily  . diltiazem  120 mg Oral Daily  . feeding  supplement (ENSURE ENLIVE)  237 mL Oral q morning - 10a  . LORazepam  0.25 mg Intravenous Once  . sorbitol  30 mL Oral Daily  . sorbitol, milk of mag, mineral oil, glycerin (SMOG) enema  960 mL Rectal q morning - 10a   Continuous Infusions:    Principal Problem:   Atrial fibrillation with RVR Active Problems:   Atrial fibrillation   Acute neutrophilic dermatosis   Chronic myelocytic leukemia   Chronic diastolic heart failure   UTI (lower urinary tract infection)   Constipation - functional   Microcytic anemia   Asymptomatic bacteriuria   Chronic atrial fibrillation   Leukocytosis   Rectal stricture   Diarrhea  Time spent: 76 Verneita Griffes, MD [facility] (P) [phone]

## 2015-02-13 NOTE — Progress Notes (Signed)
CMT notified this writer that the pt's heart rate is jumping into the 130's-140's. MD was notified. Will continue to monitor.

## 2015-02-13 NOTE — Progress Notes (Signed)
Subjective: Minimal stool output with enemas and sorbitol yesterday. Patient denies abdominal pain or blood in stool. No further episodes of confusion/agitation.  Objective: Vital signs in last 24 hours: Temp:  [97.2 F (36.2 C)-101.7 F (38.7 C)] 97.8 F (36.6 C) (07/24 1225) Pulse Rate:  [84-138] 135 (07/24 1001) Resp:  [23-34] 28 (07/24 1001) BP: (114-142)/(35-81) 124/60 mmHg (07/24 1001) SpO2:  [93 %-100 %] 100 % (07/24 1001) Weight:  [53.9 kg (118 lb 13.3 oz)] 53.9 kg (118 lb 13.3 oz) (07/24 0430) Weight change: 0.4 kg (14.1 oz) Last BM Date: 02/12/15  PE: GEN:  Elderly, pleasant, NAD ABD:  Mild protuberant and dull to percussion, non-tender, no significant distention.  Lab Results: CBC    Component Value Date/Time   WBC 37.1* 02/13/2015 0415   RBC 5.05 02/13/2015 0415   HGB 12.5 02/13/2015 0415   HCT 39.9 02/13/2015 0415   PLT 359 02/13/2015 0415   MCV 79.0 02/13/2015 0415   MCH 24.8* 02/13/2015 0415   MCHC 31.3 02/13/2015 0415   RDW 19.7* 02/13/2015 0415   LYMPHSABS 1.4 02/12/2015 1740   MONOABS 1.4* 02/12/2015 1740   EOSABS 0.4 02/12/2015 1740   BASOSABS 0.0 02/12/2015 1740   CMP     Component Value Date/Time   NA 134* 02/13/2015 0415   K 4.0 02/13/2015 0415   CL 108 02/13/2015 0415   CO2 18* 02/13/2015 0415   GLUCOSE 131* 02/13/2015 0415   BUN 15 02/13/2015 0415   CREATININE 0.97 02/13/2015 0415   CALCIUM 7.9* 02/13/2015 0415   PROT 5.2* 02/13/2015 0415   ALBUMIN 2.5* 02/13/2015 0415   AST 24 02/13/2015 0415   ALT 12* 02/13/2015 0415   ALKPHOS 61 02/13/2015 0415   BILITOT 0.8 02/13/2015 0415   GFRNONAA 50* 02/13/2015 0415   GFRAA 58* 02/13/2015 0415   Assessment:  1. Agitation/confusion. Possible sundowning or medication effect. Resolved. 2. Constipation/obstipation. Persistent. Unable to do much yesterday given her agitation/confusion. 3. Abnormal CT abdomen, proctitis and possible distal rectal stricture. 4.  Leukocytosis.  History  chronic myelogenous leukemia.  Plan:  1.  Stop enemas; they aren't helping and unfortunately there has not been any stool amenable to nursing manual disimpaction. 2.  Try Miralax 17 g po bid and dulcolax suppository today. 3.  Clear liquid diet, NPO after midnight. 4.  Goal for flexible sigmoidoscopy tomorrow for inspection of possible stercoral colitis and possible distal rectal stricture.   Landry Dyke 02/13/2015, 1:05 PM   Pager [phone] If no answer or after 5 PM call [phone]

## 2015-02-13 NOTE — Progress Notes (Signed)
ANTIBIOTIC CONSULT NOTE - INITIAL  Pharmacy Consult for Ceftriaxone Indication: UTI  No Known Allergies  Patient Measurements: Height: 5\' 4"  (162.6 cm) Weight: 118 lb 13.3 oz (53.9 kg) IBW/kg (Calculated) : 54.7 Adjusted Body Weight:   Vital Signs: Temp: 101.7 F (38.7 C) (07/24 0430) Temp Source: Axillary (07/24 0430) BP: 124/58 mmHg (07/24 0000) Pulse Rate: 126 (07/24 0000) Intake/Output from previous day: 07/23 0701 - 07/24 0700 In: 1732.3 [P.O.:570; I.V.:112.3] Out: 1000 [Urine:1000] Intake/Output from this shift: Total I/O In: 80 [Other:80] Out: -   Labs:  Recent Labs  02/11/15 0733 02/12/15 0344 02/12/15 1740 02/13/15 0415  WBC  --  27.4* 35.9* 37.1*  HGB  --  12.3 12.4 12.5  PLT  --  357 414* 359  CREATININE 0.84 0.85  --  0.97   Estimated Creatinine Clearance: 33.5 mL/min (by C-G formula based on Cr of 0.97). No results for input(s): VANCOTROUGH, VANCOPEAK, VANCORANDOM, GENTTROUGH, GENTPEAK, GENTRANDOM, TOBRATROUGH, TOBRAPEAK, TOBRARND, AMIKACINPEAK, AMIKACINTROU, AMIKACIN in the last 72 hours.   Microbiology: Recent Results (from the past 720 hour(s))  Clostridium Difficile by PCR (not at [facility])     Status: None   Collection Time: 02/09/15  2:21 PM  Result Value Ref Range Status   C difficile by pcr NEGATIVE NEGATIVE Final  MRSA PCR Screening     Status: None   Collection Time: 02/09/15  5:55 PM  Result Value Ref Range Status   MRSA by PCR NEGATIVE NEGATIVE Final    Comment:        The GeneXpert MRSA Assay (FDA approved for NASAL specimens only), is one component of a comprehensive MRSA colonization surveillance program. It is not intended to diagnose MRSA infection nor to guide or monitor treatment for MRSA infections.     Medical History: Past Medical History  Diagnosis Date  . Atrial fib/flutter, transient     Coumadin; status post TEE guided cardioversion 6/12  . Interstitial lung disease 12/17/10    CT scan  of chest, showed old  compression fracture T6.  . Microcytic anemia     Iron deficient  . Thrombocytosis   . Hyponatremia   . history of Pneumonia 12/11/10  . Chronic diastolic heart failure     Echo 5/12: EF 60-65%, mild MR, mild BAE  . Chronic myelocytic leukemia     Dr. Ralene Ok  . Acute neutrophilic dermatosis     Sweet syndrome-prednisone therapy; [facility] dermatology    Medications:  Anti-infectives    Start     Dose/Rate Route Frequency Ordered Stop   02/13/15 0630  cefTRIAXone (ROCEPHIN) 1 g in dextrose 5 % 50 mL IVPB     1 g 100 mL/hr over 30 Minutes Intravenous Daily 02/13/15 0619     02/09/15 1600  ciprofloxacin (CIPRO) IVPB 400 mg     400 mg 200 mL/hr over 60 Minutes Intravenous  Once 02/09/15 1550 02/09/15 1712   02/09/15 1600  metroNIDAZOLE (FLAGYL) IVPB 500 mg  Status:  Discontinued     500 mg 100 mL/hr over 60 Minutes Intravenous  Once 02/09/15 1550 02/09/15 1804     Assessment: Patient with UTI.  Goal of Therapy:  Rocephin based on manufacturer dosing recommendations.   Plan: Ceftriaxone 1gm iv q24hr Follow up culture results  Nani Skillern Crowford 02/13/2015,6:19 AM

## 2015-02-13 NOTE — Progress Notes (Signed)
02/13/15 1336  Notified MD that patient urine output was poor. 63mL of amber color urine from 7a-1p today.

## 2015-02-14 ENCOUNTER — Encounter (HOSPITAL_COMMUNITY)
Admission: EM | Disposition: A | Discharge status: Skilled Nursing Facility | Payer: Self-pay | Source: Home / Self Care | Attending: Internal Medicine

## 2015-02-14 ENCOUNTER — Encounter (HOSPITAL_COMMUNITY): Payer: Self-pay | Admitting: *Deleted

## 2015-02-14 HISTORY — PX: FLEXIBLE SIGMOIDOSCOPY: SHX5431

## 2015-02-14 LAB — CBC WITH DIFFERENTIAL/PLATELET
BASOS ABS: 0 10*3/uL (ref 0.0–0.1)
Basophils Relative: 0 % (ref 0–1)
Eosinophils Absolute: 0.4 10*3/uL (ref 0.0–0.7)
Eosinophils Relative: 1 % (ref 0–5)
HCT: 38.1 % (ref 36.0–46.0)
Hemoglobin: 12 g/dL (ref 12.0–15.0)
Lymphocytes Relative: 3 % — ABNORMAL LOW (ref 12–46)
Lymphs Abs: 1.3 10*3/uL (ref 0.7–4.0)
MCH: 24.5 pg — ABNORMAL LOW (ref 26.0–34.0)
MCHC: 31.5 g/dL (ref 30.0–36.0)
MCV: 77.8 fL — AB (ref 78.0–100.0)
MONO ABS: 0.9 10*3/uL (ref 0.1–1.0)
Monocytes Relative: 2 % — ABNORMAL LOW (ref 3–12)
Neutro Abs: 41.9 10*3/uL — ABNORMAL HIGH (ref 1.7–7.7)
Neutrophils Relative %: 94 % — ABNORMAL HIGH (ref 43–77)
Platelets: 415 10*3/uL — ABNORMAL HIGH (ref 150–400)
RBC: 4.9 MIL/uL (ref 3.87–5.11)
RDW: 20 % — AB (ref 11.5–15.5)
WBC: 44.5 10*3/uL — ABNORMAL HIGH (ref 4.0–10.5)

## 2015-02-14 LAB — COMPREHENSIVE METABOLIC PANEL
ALBUMIN: 2.2 g/dL — AB (ref 3.5–5.0)
ALT: 12 U/L — ABNORMAL LOW (ref 14–54)
ANION GAP: 6 (ref 5–15)
AST: 29 U/L (ref 15–41)
Alkaline Phosphatase: 60 U/L (ref 38–126)
BUN: 21 mg/dL — ABNORMAL HIGH (ref 6–20)
CO2: 19 mmol/L — AB (ref 22–32)
CREATININE: 0.97 mg/dL (ref 0.44–1.00)
Calcium: 7.7 mg/dL — ABNORMAL LOW (ref 8.9–10.3)
Chloride: 108 mmol/L (ref 101–111)
GFR calc Af Amer: 58 mL/min — ABNORMAL LOW (ref >60)
GFR calc non Af Amer: 50 mL/min — ABNORMAL LOW (ref >60)
Glucose, Bld: 123 mg/dL — ABNORMAL HIGH (ref 65–99)
Potassium: 4.7 mmol/L (ref 3.5–5.1)
SODIUM: 133 mmol/L — AB (ref 135–145)
Total Bilirubin: 0.8 mg/dL (ref 0.3–1.2)
Total Protein: 4.7 g/dL — ABNORMAL LOW (ref 6.5–8.1)

## 2015-02-14 LAB — URINE CULTURE: Culture: NO GROWTH

## 2015-02-14 SURGERY — SIGMOIDOSCOPY, FLEXIBLE
Anesthesia: Moderate Sedation | Laterality: Left

## 2015-02-14 MED ORDER — METOPROLOL TARTRATE 1 MG/ML IV SOLN
2.5000 mg | Freq: Four times a day (QID) | INTRAVENOUS | Status: DC
  Administered 2015-02-14 – 2015-02-16 (×3): 2.5 mg via INTRAVENOUS
  Filled 2015-02-14 (×3): qty 5

## 2015-02-14 MED ORDER — MIDAZOLAM HCL 10 MG/2ML IJ SOLN
INTRAMUSCULAR | Status: DC | PRN
  Administered 2015-02-14: 1 mg via INTRAVENOUS

## 2015-02-14 MED ORDER — FENTANYL CITRATE (PF) 100 MCG/2ML IJ SOLN
INTRAMUSCULAR | Status: AC
  Filled 2015-02-14: qty 2

## 2015-02-14 MED ORDER — METOPROLOL TARTRATE 1 MG/ML IV SOLN
2.5000 mg | Freq: Once | INTRAVENOUS | Status: AC
Start: 2015-02-14 — End: 2015-02-14
  Administered 2015-02-14: 2.5 mg via INTRAVENOUS
  Filled 2015-02-14: qty 5

## 2015-02-14 MED ORDER — SODIUM CHLORIDE 0.9 % IV SOLN: INTRAVENOUS | Status: DC

## 2015-02-14 MED ORDER — MIDAZOLAM HCL 5 MG/ML IJ SOLN
INTRAMUSCULAR | Status: AC
  Filled 2015-02-14: qty 2

## 2015-02-14 MED ORDER — DILTIAZEM HCL 30 MG PO TABS
30.0000 mg | ORAL_TABLET | ORAL | Status: AC
  Administered 2015-02-14: 30 mg via ORAL
  Filled 2015-02-14: qty 1

## 2015-02-14 MED ORDER — FENTANYL CITRATE (PF) 100 MCG/2ML IJ SOLN
INTRAMUSCULAR | Status: DC | PRN
  Administered 2015-02-14: 12.5 ug via INTRAVENOUS

## 2015-02-14 NOTE — Care Management Important Message (Signed)
Important Message  Patient Details  Name: [name] MRN: [id number] Date of Birth: 04-25-1923   Medicare Important Message Given:  Yes-third notification given    Camillo Flaming 02/14/2015, 1:07 [place] Message  Patient Details  Name: [name] MRN: [id number] Date of Birth: 08-21-1922   Medicare Important Message Given:  Yes-third notification given    Camillo Flaming 02/14/2015, 1:07 PM

## 2015-02-14 NOTE — Op Note (Signed)
[facility] [place], [address]   FLEXIBLE SIGMOIDOSCOPY PROCEDURE REPORT  PATIENT: [name], [name]  MR#: [id number] BIRTHDATE: 02/28/1923 , 79  yrs. old GENDER: female ENDOSCOPIST: Laurence Spates, MD REFERRED BY: [facility] PROCEDURE DATE:  02/14/2015 PROCEDURE:   Sigmoidoscopy, diagnostic ASA CLASS:   Class III INDICATIONS:difficulty passing stool and thickening of the rectosigmoid area on CT scan. MEDICATIONS: fentanyl 12.5 g, versed 1 mg  DESCRIPTION OF PROCEDURE:   After the risks benefits and alternatives of the procedure were thoroughly explained, informed consent was obtained.  Digital exam revealed no abnormalities of the rectum. The     endoscope was introduced through the anus  and advanced to the splenic flexure , The exam was Without limitations. The quality of the prep was The overall prep quality was fair. . Estimated blood loss is zero unless otherwise noted in this procedure report. The instrument was then slowly withdrawn as the mucosa was fully examined.         COLON FINDINGS: No lesions seen in the rectosigmoid area.  Lumen widely patent from anus to splenic flexure.          The scope was then withdrawn from the patient and the procedure terminated.  COMPLICATIONS: There were no immediate complications.  ENDOSCOPIC IMPRESSION: No lesions seen in the rectosigmoid area.  Lumen widely patent from anus to splenic flexure. no signs of obstruction in the rectosigmoid area.  RECOMMENDATIONS: Will resume diet and keep the patient on Miralax.  REPEAT EXAM:  eSigned:  Laurence Spates, MD 02/14/2015 1:59 PM   CC:

## 2015-02-14 NOTE — Progress Notes (Signed)
[facility] PROGRESS NOTE  Ranell Finelli ZSW:[id number] DOB: 11-Dec-1922 DOA: 02/09/2015 PCP: [name], MD  HPI/Subjective:  81 y/o ? Afib Chad2Vasc2 ~5 not on AC [2/2 to poor compliance and INR 10 at some point in the past] nor Rate controlling agent, CMLpreviously seen by 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  She was admitted to step down for atrial fibrillation and constipation.   She had heme + brown stool on rectal exam and c. Diff negative.  Her relatives have indicated that she has become increasingly forgetful with a slight slowing of mentation over the past 6 months.   Found to have presumptive pyelonephritis on 7/23 and retention of urine and treated for this Transfer to the floor 7/24     Subjective:  Just returned to room from Endo when i saw her Resting quietly No cp Family at the bedside     Assessment/Plan:   Pyelonephritis Patient had acute urinary retention on 7/23 -Indwelling catheter placed 7/23 for urine retention above 1000 cc -Continue empiric Rocephin for now -UC so far NG    Atrial fibrillation -Chad2vasc2 score is 4 - she has not previously been on a rate controlling agent or anticoagulant - 1 prior admission where she had to be cardioverted -Cardizem drip conversion as per Cardiology to 120 Cardizem CD 02/12/15 -Echo 60-65% EF with no wall motion abnormalities - IV heparin GTT-d/c per cardiology 7/22 - Some memory loss may be due to microvascular insults secondary to A.fib -Cardiology consulted appreciated - Dig added-Dig level 7/22=0.7 -Only ASA for Afib per cardiology  Toxic Metabolic Encephalopathy - MMSE demonstrated  27/30 -Overnight 7/22, probable delirium secondary to a 1st generation antihistamine benadryl [had a lot of ithcing] - Seems back to baseline  Acute neutrophilic dermatosis/Sweet syndrome -Stable at present time -RA factor elevated in the past 11/2010 might be in keeping with this - Itching/rash with skin dryness - Sarna lotion PRN - Discontinue benadryl due to AMS earlier in admission  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 -Discontinue Cipro/Flagyl as no specific indication I can see -PLT and Hemoglobin are fine currently -d/W Dr. [name] on phone 7/22 who recs OP work-up and feels that this is more likely a leukemoid reaction and can be worked up PRN OP -Leukocytosis currently 7/24 explained by pyelonephritis. Monitor  Chronic diastolic heart failure, EF this admit 60-65% - Currently stable, no signs of fluid overload - Cardiology to follow   Constipation - functional - Passed loose stool last night after drinking Sorbitol, no blood in stool - Unlikely infectious process - Continue Lactulose/sorbitol scheduled till regular stools - GI did flex sig 7/25=normal -reg diet  Microcytic anemia -h/o of Sweet syndrome as well as CML  Inadequate oral intake - Likely due to lethargy/confision per patient and family, patient also reports decreased appetite  - Consulted nutrition - Ensure per their recommendation, monitor daily weight   ILD on prior steroids -Monitor -Poor candidate for Amiodarone for rate control  Code Status: Full Family Communication: Patient as well as niece updated fully Disposition Plan: Remain inpatient--transfer to tele 7/23  Consultants:  Cardiology  GI  Procedures: Colonoscopy originally scheduled for 02/11/15, cancelled due to AMS l  Antibiotics:  Cipro 02/09/15 >> 02/09/15  Flagyl 02/09/15 >> 02/09/15  Rocephin  7/24    Objective: Filed Vitals:   02/14/15 1441  BP: 97/53  Pulse: 91  Temp: 98.7 F (37.1 C)  Resp: 20    Intake/Output Summary (Last 24 hours) at 02/14/15 1745 Last data filed at 02/14/15 1732  Gross per 24 hour  Intake 1681.25 ml  Output    300 ml  Net 1381.25 ml   Filed Weights   02/12/15 0400 02/13/15 0430 02/14/15 1309  Weight: 54.9 kg (121 lb 0.5 oz) 53.9 kg (118 lb 13.3 oz) 53.524 kg (118 lb)    Exam:   General:  Patient seated comfortably in bed in no distress, is alert, oriented and pleasant. Was in soft restraints due to AMS last night, but these have now been removed.  Cardiovascular: Irregular rate, no m/r/g, no LE edema  Musculoskeletal: Grossly normal tone Neuro: oreitned x 1   Data Reviewed: Basic Metabolic Panel:  Recent Labs Lab 02/09/15 1845 02/10/15 0340 02/11/15 0733 02/12/15 0344 02/13/15 0415 02/14/15 0515  NA  --  135 135 135 134* 133*  K  --  4.3 4.2 3.9 4.0 4.7  CL  --  108 108 111 108 108  CO2  --  19* 19* 19* 18* 19*  GLUCOSE  --  92 109* 114* 131* 123*  BUN  --  21* 13 9 15  21*  CREATININE  --  0.78 0.84 0.85 0.97 0.97  CALCIUM  --  8.2* 8.3* 7.6* 7.9* 7.7*  MG 1.8  --   --   --   --   --    Liver Function Tests:  Recent Labs Lab 02/09/15 1220 02/11/15 0733 02/12/15 0344 02/13/15 0415 02/14/15 0515  AST 27 31 22 24 29   ALT 13* 14 12* 12* 12*  ALKPHOS 67 67 63 61 60  BILITOT 0.8 0.7 0.5 0.8 0.8  PROT 7.2 6.1* 5.4* 5.2* 4.7*  ALBUMIN 3.8 3.3* 2.8* 2.5* 2.2*    Recent Labs Lab 02/09/15 1220  LIPASE 23   No results for input(s): AMMONIA in the last 168 hours. CBC:  Recent Labs Lab 02/11/15 0730 02/12/15 0344 02/12/15 1740 02/13/15 0415 02/14/15 0515  WBC 29.3* 27.4* 35.9* 37.1* 44.5*  NEUTROABS 25.7* 24.6* 32.7*  --  41.9*  HGB 12.3 12.3 12.4 12.5 12.0  HCT 40.4 38.6 40.3 39.9 38.1  MCV 81.1 78.5 78.1 79.0 77.8*  PLT 336 357 414* 359 415*    Recent Results (from the past 240 hour(s))   Clostridium Difficile by PCR (not at [facility])     Status: None   Collection Time: 02/09/15  2:21 PM  Result Value Ref Range Status   C difficile by pcr NEGATIVE NEGATIVE Final  MRSA PCR Screening     Status: None   Collection Time: 02/09/15  5:55 PM  Result Value Ref Range Status   MRSA by PCR NEGATIVE NEGATIVE Final    Comment:        The GeneXpert MRSA Assay (FDA approved for NASAL specimens only), is one component of a comprehensive MRSA colonization surveillance program. It is not intended to diagnose MRSA infection nor to guide or monitor treatment for MRSA infections.   Culture, blood (routine x 2)     Status: None (Preliminary result)   Collection Time: 02/12/15  5:33 PM  Result Value Ref Range Status   Specimen Description  BLOOD RIGHT HAND  Final   Special Requests BOTTLES DRAWN AEROBIC AND ANAEROBIC 10CC  Final   Culture   Final    NO GROWTH 2 DAYS Performed at [facility]    Report Status PENDING  Incomplete  Culture, blood (routine x 2)     Status: None (Preliminary result)   Collection Time: 02/12/15  5:40 PM  Result Value Ref Range Status   Specimen Description LEFT ANTECUBITAL  Final   Special Requests BOTTLES DRAWN AEROBIC AND ANAEROBIC 10CC  Final   Culture   Final    NO GROWTH 2 DAYS Performed at [facility]    Report Status PENDING  Incomplete  Culture, Urine     Status: None   Collection Time: 02/12/15  6:45 PM  Result Value Ref Range Status   Specimen Description URINE, CATHETERIZED  Final   Special Requests Immunocompromised  Final   Culture   Final    NO GROWTH 1 DAY Performed at [facility]    Report Status 02/14/2015 FINAL  Final     Studies: Dg Chest Port 1 View  02/12/2015   CLINICAL DATA:  Three day history of cough, shortness of breath and generalized weakness. Current history of chronic diastolic heart failure.  EXAM: PORTABLE CHEST - 1 VIEW  COMPARISON:  01/21/2011 and earlier.  FINDINGS: Cardiac silhouette  moderately enlarged, increased in size since the prior examination. Thoracic aorta atherosclerotic and mildly tortuous, unchanged. Hilar and mediastinal contours otherwise unremarkable. Emphysematous changes throughout both lungs. Pulmonary venous hypertension with minimal interstitial pulmonary edema as there are scattered Kerley B-lines. Suboptimal inspiration with mild atelectasis in the lung bases, left greater than right.  IMPRESSION: 1. Mild CHF superimposed upon baseline COPD/emphysema. 2. Suboptimal inspiration accounts for mild bibasilar atelectasis.   Electronically Signed   By: Evangeline Dakin M.D.   On: 02/12/2015 18:36    Scheduled Meds: . aspirin  81 mg Oral Daily  . cefTRIAXone (ROCEPHIN)  IV  1 g Intravenous Q0600  . digoxin  0.125 mg Oral Daily  . diltiazem  180 mg Oral Daily  . enoxaparin (LOVENOX) injection  40 mg Subcutaneous Q24H  . feeding supplement (ENSURE ENLIVE)  237 mL Oral q morning - 10a  . LORazepam  0.25 mg Intravenous Once  . polyethylene glycol  17 g Oral q12n4p   Continuous Infusions: . sodium chloride 75 mL/hr at 02/14/15 9528    Principal Problem:   Atrial fibrillation with RVR Active Problems:   Atrial fibrillation   Acute neutrophilic dermatosis   Chronic myelocytic leukemia   Chronic diastolic heart failure   UTI (lower urinary tract infection)   Constipation - functional   Microcytic anemia   Asymptomatic bacteriuria   Chronic atrial fibrillation   Leukocytosis   Rectal stricture   Diarrhea  Time spent: 31 Verneita Griffes, MD [facility] (P) [phone]

## 2015-02-14 NOTE — Progress Notes (Signed)
PT Cancellation Note  Patient Details Name: [name] MRN: [id number] DOB: [birth year]   Cancelled Treatment:    Reason Eval/Treat Not Completed: Patient at procedure or test/unavailable   [name] 02/14/2015, 2:08 PM [name] PT [phone]

## 2015-02-14 NOTE — Progress Notes (Signed)
Liquid stool with the miralax enemas. Will attempt the sigmoid later today.

## 2015-02-14 NOTE — H&P (View-Only) (Signed)
Subjective: Minimal stool output with enemas and sorbitol yesterday. Patient denies abdominal pain or blood in stool. No further episodes of confusion/agitation.  Objective: Vital signs in last 24 hours: Temp:  [97.2 F (36.2 C)-101.7 F (38.7 C)] 97.8 F (36.6 C) (07/24 1225) Pulse Rate:  [84-138] 135 (07/24 1001) Resp:  [23-34] 28 (07/24 1001) BP: (114-142)/(35-81) 124/60 mmHg (07/24 1001) SpO2:  [93 %-100 %] 100 % (07/24 1001) Weight:  [53.9 kg (118 lb 13.3 oz)] 53.9 kg (118 lb 13.3 oz) (07/24 0430) Weight change: 0.4 kg (14.1 oz) Last BM Date: 02/12/15  PE: GEN:  Elderly, pleasant, NAD ABD:  Mild protuberant and dull to percussion, non-tender, no significant distention.  Lab Results: CBC    Component Value Date/Time   WBC 37.1* 02/13/2015 0415   RBC 5.05 02/13/2015 0415   HGB 12.5 02/13/2015 0415   HCT 39.9 02/13/2015 0415   PLT 359 02/13/2015 0415   MCV 79.0 02/13/2015 0415   MCH 24.8* 02/13/2015 0415   MCHC 31.3 02/13/2015 0415   RDW 19.7* 02/13/2015 0415   LYMPHSABS 1.4 02/12/2015 1740   MONOABS 1.4* 02/12/2015 1740   EOSABS 0.4 02/12/2015 1740   BASOSABS 0.0 02/12/2015 1740   CMP     Component Value Date/Time   NA 134* 02/13/2015 0415   K 4.0 02/13/2015 0415   CL 108 02/13/2015 0415   CO2 18* 02/13/2015 0415   GLUCOSE 131* 02/13/2015 0415   BUN 15 02/13/2015 0415   CREATININE 0.97 02/13/2015 0415   CALCIUM 7.9* 02/13/2015 0415   PROT 5.2* 02/13/2015 0415   ALBUMIN 2.5* 02/13/2015 0415   AST 24 02/13/2015 0415   ALT 12* 02/13/2015 0415   ALKPHOS 61 02/13/2015 0415   BILITOT 0.8 02/13/2015 0415   GFRNONAA 50* 02/13/2015 0415   GFRAA 58* 02/13/2015 0415   Assessment:  1. Agitation/confusion. Possible sundowning or medication effect. Resolved. 2. Constipation/obstipation. Persistent. Unable to do much yesterday given her agitation/confusion. 3. Abnormal CT abdomen, proctitis and possible distal rectal stricture. 4.  Leukocytosis.  History  chronic myelogenous leukemia.  Plan:  1.  Stop enemas; they aren't helping and unfortunately there has not been any stool amenable to nursing manual disimpaction. 2.  Try Miralax 17 g po bid and dulcolax suppository today. 3.  Clear liquid diet, NPO after midnight. 4.  Goal for flexible sigmoidoscopy tomorrow for inspection of possible stercoral colitis and possible distal rectal stricture.   Landry Dyke 02/13/2015, 1:05 PM   Pager [phone] If no answer or after 5 PM call [phone]

## 2015-02-14 NOTE — Interval H&P Note (Signed)
History and Physical Interval Note:  02/14/2015 1:36 PM  [name]  has presented today for surgery, with the diagnosis of constipation, abnormal CT scan (possible distal rectal stricture, proctitis)  The various methods of treatment have been discussed with the patient and family. After consideration of risks, benefits and other options for treatment, the patient has consented to  Procedure(s): FLEXIBLE SIGMOIDOSCOPY (Left) as a surgical intervention .  The patient's history has been reviewed, patient examined, no change in status, stable for surgery.  I have reviewed the patient's chart and labs.  Questions were answered to the patient's satisfaction.     Nasira Janusz JR,Jonel Sick L

## 2015-02-14 NOTE — Care Management Note (Signed)
Case Management Note  Patient Details  Name: [name] MRN: [id number] Date of Birth: 1923-03-03  Subjective/Objective:  S/p flex sig-no lesions. GI following.                  Action/Plan:await PT recc.   Expected Discharge Date:   (unknown)               Expected Discharge Plan:  Home/Self Care  In-House Referral:  Clinical Social Work  Discharge planning Services  CM Consult  Post Acute Care Choice:  NA Choice offered to:  NA  DME Arranged:  N/A DME Agency:  NA  HH Arranged:  NA HH Agency:  NA  Status of Service:  In process, will continue to follow  Medicare Important Message Given:  Yes-third notification given Date Medicare IM Given:    Medicare IM give by:    Date Additional Medicare IM Given:    Additional Medicare Important Message give by:     If discussed at [place] of Stay Meetings, dates discussed:    Additional Comments:  Dessa Phi, RN 02/14/2015, 4:03 PM

## 2015-02-15 ENCOUNTER — Inpatient Hospital Stay (HOSPITAL_COMMUNITY): Payer: Medicare Other

## 2015-02-15 ENCOUNTER — Encounter (HOSPITAL_COMMUNITY): Payer: Self-pay | Admitting: Gastroenterology

## 2015-02-15 DIAGNOSIS — I4891 Unspecified atrial fibrillation: Secondary | ICD-10-CM

## 2015-02-15 LAB — CBC WITH DIFFERENTIAL/PLATELET
Band Neutrophils: 0 % (ref 0–10)
Basophils Absolute: 0 K/uL (ref 0.0–0.1)
Basophils Relative: 0 % (ref 0–1)
Blasts: 0 %
Eosinophils Absolute: 2 K/uL — ABNORMAL HIGH (ref 0.0–0.7)
Eosinophils Relative: 4 % (ref 0–5)
HCT: 39 % (ref 36.0–46.0)
Hemoglobin: 12 g/dL (ref 12.0–15.0)
Lymphocytes Relative: 7 % — ABNORMAL LOW (ref 12–46)
Lymphs Abs: 3.5 K/uL (ref 0.7–4.0)
MCH: 23.7 pg — ABNORMAL LOW (ref 26.0–34.0)
MCHC: 30.8 g/dL (ref 30.0–36.0)
MCV: 77.1 fL — ABNORMAL LOW (ref 78.0–100.0)
Metamyelocytes Relative: 0 %
Monocytes Absolute: 1.5 K/uL — ABNORMAL HIGH (ref 0.1–1.0)
Monocytes Relative: 3 % (ref 3–12)
Myelocytes: 8 %
Neutro Abs: 42.3 K/uL — ABNORMAL HIGH (ref 1.7–7.7)
Neutrophils Relative %: 77 % (ref 43–77)
Other: 0 %
Platelets: 515 K/uL — ABNORMAL HIGH (ref 150–400)
Promyelocytes Absolute: 1 %
RBC: 5.06 MIL/uL (ref 3.87–5.11)
RDW: 21 % — ABNORMAL HIGH (ref 11.5–15.5)
WBC: 49.3 K/uL — ABNORMAL HIGH (ref 4.0–10.5)
nRBC: 1 /100{WBCs} — ABNORMAL HIGH

## 2015-02-15 LAB — BASIC METABOLIC PANEL
Anion gap: 7 (ref 5–15)
BUN: 24 mg/dL — ABNORMAL HIGH (ref 6–20)
CHLORIDE: 107 mmol/L (ref 101–111)
CO2: 18 mmol/L — AB (ref 22–32)
Calcium: 7.8 mg/dL — ABNORMAL LOW (ref 8.9–10.3)
Creatinine, Ser: 0.94 mg/dL (ref 0.44–1.00)
GFR calc non Af Amer: 52 mL/min — ABNORMAL LOW (ref >60)
GLUCOSE: 134 mg/dL — AB (ref 65–99)
POTASSIUM: 4.6 mmol/L (ref 3.5–5.1)
Sodium: 132 mmol/L — ABNORMAL LOW (ref 135–145)

## 2015-02-15 LAB — DIGOXIN LEVEL: Digoxin Level: 0.9 ng/mL (ref 0.8–2.0)

## 2015-02-15 LAB — LEUKOCYTE ALKALINE PHOSPHATASE: Leukocyte Alkaline  Phos Stain: 27 (ref 25–130)

## 2015-02-15 MED ORDER — DIGOXIN 125 MCG PO TABS: 0.1250 mg | ORAL_TABLET | Freq: Every day | ORAL | Status: DC

## 2015-02-15 MED ORDER — POLYVINYL ALCOHOL 1.4 % OP SOLN
1.0000 [drp] | OPHTHALMIC | Status: DC | PRN
  Filled 2015-02-15: qty 15

## 2015-02-15 MED ORDER — VANCOMYCIN HCL IN DEXTROSE 750-5 MG/150ML-% IV SOLN
750.0000 mg | INTRAVENOUS | Status: DC
  Administered 2015-02-16: 750 mg via INTRAVENOUS
  Filled 2015-02-15: qty 150

## 2015-02-15 MED ORDER — DILTIAZEM HCL ER COATED BEADS 240 MG PO CP24
240.0000 mg | ORAL_CAPSULE | Freq: Every day | ORAL | Status: DC
  Administered 2015-02-15 – 2015-02-18 (×4): 240 mg via ORAL
  Filled 2015-02-15 (×5): qty 1

## 2015-02-15 MED ORDER — DIGOXIN 125 MCG PO TABS
0.1250 mg | ORAL_TABLET | Freq: Every day | ORAL | Status: DC
  Administered 2015-02-15 – 2015-02-23 (×8): 0.125 mg via ORAL
  Filled 2015-02-15 (×8): qty 1

## 2015-02-15 MED ORDER — DEXTROSE 5 % IV SOLN
1.0000 g | Freq: Two times a day (BID) | INTRAVENOUS | Status: DC
  Administered 2015-02-15 – 2015-02-16 (×3): 1 g via INTRAVENOUS
  Filled 2015-02-15 (×4): qty 1

## 2015-02-15 MED ORDER — HYDROCOD POLST-CPM POLST ER 10-8 MG/5ML PO SUER
5.0000 mL | Freq: Once | ORAL | Status: AC
  Administered 2015-02-15: 5 mL via ORAL
  Filled 2015-02-15: qty 5

## 2015-02-15 MED ORDER — SODIUM CHLORIDE 0.9 % IV SOLN
1250.0000 mg | Freq: Once | INTRAVENOUS | Status: AC
  Administered 2015-02-15: 1250 mg via INTRAVENOUS
  Filled 2015-02-15: qty 1250

## 2015-02-15 NOTE — Progress Notes (Signed)
     SUBJECTIVE: Pt is sleeping. Family states that she has had no complaints today.   BP 101/66 mmHg  Pulse 128  Temp(Src) 98.1 F (36.7 C) (Axillary)  Resp 19  Ht 5\' 4"  (1.626 m)  Wt 124 lb 9.6 oz (56.518 kg)  BMI 21.38 kg/m2  SpO2 95%  Intake/Output Summary (Last 24 hours) at 02/15/15 1254 Last data filed at 02/15/15 0160  Gross per 24 hour  Intake   2055 ml  Output    300 ml  Net   1755 ml    PHYSICAL EXAM General: Elderly female in NAD, sleeping.  Neck: No JVD. No masses noted.  Lungs: Decreased BS left lung. No wheezes or rhonci noted.  Heart: Irreg irreg with mild systolic murmur.  Abdomen: Bowel sounds are present. Soft Extremities: No lower extremity edema.   LABS: Basic Metabolic Panel:  Recent Labs  02/14/15 0515 02/15/15 1130  NA 133* 132*  K 4.7 4.6  CL 108 107  CO2 19* 18*  GLUCOSE 123* 134*  BUN 21* 24*  CREATININE 0.97 0.94  CALCIUM 7.7* 7.8*   CBC:  Recent Labs  02/14/15 0515 02/15/15 1130  WBC 44.5* 49.3*  NEUTROABS 41.9* PENDING  HGB 12.0 12.0  HCT 38.1 39.0  MCV 77.8* 77.1*  PLT 415* 515*    Current Meds: . aspirin  81 mg Oral Daily  . cefTAZidime (FORTAZ)  IV  1 g Intravenous Q12H  . digoxin  0.125 mg Oral Daily  . diltiazem  240 mg Oral Daily  . enoxaparin (LOVENOX) injection  40 mg Subcutaneous Q24H  . feeding supplement (ENSURE ENLIVE)  237 mL Oral q morning - 10a  . LORazepam  0.25 mg Intravenous Once  . metoprolol  2.5 mg Intravenous 4 times per day  . polyethylene glycol  17 g Oral q12n4p  . vancomycin  1,250 mg Intravenous Once  . [START ON 02/16/2015] vancomycin  750 mg Intravenous Q24H     ASSESSMENT AND PLAN:  1. Permanent atrial fibrillation/aflutter with rapid ventricular response : She has persistent atrial fibrillation. CHADS-VASC score of 4.  She was taken off of coumadin in June 2012 as she has trouble remembering her medication and had INR of 10.1 at one point. Given her advanced age, positive hemacult  and mental status, she has been managed with ASA only. She is not felt to be a good candidate for any systemic anticoagulation. Rate control strategy is in place. She is currently on Cardizem CD 240 mg daily and digoxin 0.125 mg daily. Heart rate is controlled this afternoon on current regimen (100-110 on tele currently). Would continue current regimen. Her heart rate may be difficult to control at times during this acute illness. BP tolerating current meds.   2. Chronic diastolic HF: Volume status is ok. Echo 02/10/2015 EF 60-65%, no RWMA, moderately calcified mitral annulus, PA peak pressure 32mmHg  We will follow along with you.   Rye Decoste  7/26/201612:54 PM

## 2015-02-15 NOTE — Progress Notes (Signed)
[facility] PROGRESS NOTE  [name] QIW:[id number] DOB: February 23, 1923 DOA: 02/09/2015 PCP: [name], MD  HPI/Subjective:  81 y/o ? Afib Chad2Vasc2 ~5 not on AC [2/2 to poor compliance and INR 10 at some point in the past] nor Rate controlling agent, CMLpreviously seen by 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  She was admitted to step down for atrial fibrillation and constipation.   She had heme + brown stool on rectal exam and c. Diff negative.  Her relatives have indicated that she has become increasingly forgetful with a slight slowing of mentation over the past 6 months.   Found to have presumptive pyelonephritis on 7/23 and retention of urine and treated for this Transfer to the floor 7/24 She had another spike of fever and was found to have a new HCAP on CXR 7/26     Subjective:  Coughed most of the night per Niece. No fever Tolerating some diet now and mild confusion only per Neice   Assessment/Plan:  ?Aspiration PNA Unclear if Pneumonia a result of PO intake s/p Endoscopy and some sleepiness reported on 7/26 Get SLP to see 7/27 am  Pyelonephritis Patient had acute urinary retention on 7/23 -Indwelling catheter placed 7/23 for urine retention above 1000 cc -Continue empiric Rocephin for now -UC so far NG    Atrial fibrillation -Chad2vasc2 score is 4 - she has not previously been on a rate controlling agent or anticoagulant - 1 prior admission where she had to be cardioverted -Cardizem drip conversion as per Cardiology to 120 Cardizem CD 02/12/15 -Echo 60-65% EF with no wall motion abnormalities - IV heparin GTT-d/c per cardiology 7/22 - Some memory  loss may be due to microvascular insults secondary to A.fib -Cardiology consulted appreciated - Dig added-Dig level 7/22=0.7 -Only ASA for Afib per cardiology  Toxic Metabolic Encephalopathy - MMSE demonstrated 27/30 -Overnight 7/22, probable delirium secondary to a 1st generation antihistamine benadryl [had a lot of ithcing] - Seems back to baseline  Acute neutrophilic dermatosis/Sweet syndrome -Stable at present time -RA factor elevated in the past 11/2010 might be in keeping with this - Itching/rash with skin dryness - Sarna lotion PRN - Discontinue benadryl due to AMS earlier in admission  Chronic myelocytic leukemia with potential leukemoid reaction  -Add on platelets smear as well as lap score pending still -WBC decreased to 23 from 31.8-->48 2/2 to superimposed Pna on cxr 7/26 -PLT and Hemoglobin are fine currently -d/W Dr. [name] on phone 7/22 who recs OP work-up and feels that this is more likely a leukemoid reaction and can be worked up PRN OP  Chronic diastolic heart failure, EF this admit 60-65% - Currently stable, no signs of fluid overload - Cardiology to follow   Constipation - functional - Passed loose stool last night after drinking Sorbitol, no blood in stool - Unlikely infectious process - Continue Lactulose/sorbitol scheduled till regular stools - GI did flex sig 7/25=normal -reg diet  Microcytic anemia -h/o of Sweet syndrome as well as CML  Inadequate oral intake - Likely due to lethargy/confision per patient and family, patient also reports decreased appetite  - Consulted nutrition - Ensure per their recommendation, monitor daily weight   ILD on prior steroids -Monitor -Poor  candidate for Amiodarone for rate control  Code Status: Full Family Communication: Patient as well as niece updated fully Disposition Plan: Remain inpatient--transfer to tele 7/23  Consultants:  Cardiology  GI  Procedures: Colonoscopy originally scheduled for  02/11/15, cancelled due to AMS l  Antibiotics:  Cipro 02/09/15 >> 02/09/15  Flagyl 02/09/15 >> 02/09/15  Rocephin 7/24-->Ceftazidime 7/26-->  Vancomycin 7/26--->    Objective: Filed Vitals:   02/15/15 0822  BP: 118/71  Pulse:   Temp:   Resp:     Intake/Output Summary (Last 24 hours) at 02/15/15 0936 Last data filed at 02/15/15 1884  Gross per 24 hour  Intake   2055 ml  Output    300 ml  Net   1755 ml   Filed Weights   02/13/15 0430 02/14/15 1309 02/15/15 0440  Weight: 53.9 kg (118 lb 13.3 oz) 53.524 kg (118 lb) 56.518 kg (124 lb 9.6 oz)    Exam:   General:  Patient seated comfortably in bed in no distress, is alert, oriented and pleasant.   Resp-Crakcles bilat-R>L  Cardiovascular: Irregular rate, no m/r/g, no LE edema  Musculoskeletal: Grossly normal tone Neuro: oreitned x 1   Data Reviewed: Basic Metabolic Panel:  Recent Labs Lab 02/09/15 1845 02/10/15 0340 02/11/15 0733 02/12/15 0344 02/13/15 0415 02/14/15 0515  NA  --  135 135 135 134* 133*  K  --  4.3 4.2 3.9 4.0 4.7  CL  --  108 108 111 108 108  CO2  --  19* 19* 19* 18* 19*  GLUCOSE  --  92 109* 114* 131* 123*  BUN  --  21* 13 9 15  21*  CREATININE  --  0.78 0.84 0.85 0.97 0.97  CALCIUM  --  8.2* 8.3* 7.6* 7.9* 7.7*  MG 1.8  --   --   --   --   --    Liver Function Tests:  Recent Labs Lab 02/09/15 1220 02/11/15 0733 02/12/15 0344 02/13/15 0415 02/14/15 0515  AST 27 31 22 24 29   ALT 13* 14 12* 12* 12*  ALKPHOS 67 67 63 61 60  BILITOT 0.8 0.7 0.5 0.8 0.8  PROT 7.2 6.1* 5.4* 5.2* 4.7*  ALBUMIN 3.8 3.3* 2.8* 2.5* 2.2*    Recent Labs Lab 02/09/15 1220  LIPASE 23   No results for input(s): AMMONIA in the last 168 hours. CBC:  Recent Labs Lab 02/11/15 0730 02/12/15 0344 02/12/15 1740 02/13/15 0415 02/14/15 0515  WBC 29.3* 27.4* 35.9* 37.1* 44.5*  NEUTROABS 25.7* 24.6* 32.7*  --  41.9*  HGB 12.3 12.3 12.4 12.5 12.0  HCT 40.4 38.6 40.3 39.9 38.1  MCV 81.1 78.5 78.1 79.0  77.8*  PLT 336 357 414* 359 415*    Recent Results (from the past 240 hour(s))  Clostridium Difficile by PCR (not at [facility])     Status: None   Collection Time: 02/09/15  2:21 PM  Result Value Ref Range Status   C difficile by pcr NEGATIVE NEGATIVE Final  MRSA PCR Screening     Status: None   Collection Time: 02/09/15  5:55 PM  Result Value Ref Range Status   MRSA by PCR NEGATIVE NEGATIVE Final    Comment:        The GeneXpert MRSA Assay (FDA approved for NASAL specimens only), is one component of a comprehensive MRSA colonization surveillance program. It is not intended to diagnose MRSA infection nor to guide or monitor treatment for MRSA infections.   Culture, blood (routine x 2)  Status: None (Preliminary result)   Collection Time: 02/12/15  5:33 PM  Result Value Ref Range Status   Specimen Description BLOOD RIGHT HAND  Final   Special Requests BOTTLES DRAWN AEROBIC AND ANAEROBIC 10CC  Final   Culture   Final    NO GROWTH 2 DAYS Performed at [facility]    Report Status PENDING  Incomplete  Culture, blood (routine x 2)     Status: None (Preliminary result)   Collection Time: 02/12/15  5:40 PM  Result Value Ref Range Status   Specimen Description LEFT ANTECUBITAL  Final   Special Requests BOTTLES DRAWN AEROBIC AND ANAEROBIC 10CC  Final   Culture   Final    NO GROWTH 2 DAYS Performed at [facility]    Report Status PENDING  Incomplete  Culture, Urine     Status: None   Collection Time: 02/12/15  6:45 PM  Result Value Ref Range Status   Specimen Description URINE, CATHETERIZED  Final   Special Requests Immunocompromised  Final   Culture   Final    NO GROWTH 1 DAY Performed at [facility]    Report Status 02/14/2015 FINAL  Final     Studies: No results found.  Scheduled Meds: . aspirin  81 mg Oral Daily  . cefTRIAXone (ROCEPHIN)  IV  1 g Intravenous Q0600  . digoxin  0.125 mg Oral Daily  . diltiazem  240 mg Oral Daily  .  enoxaparin (LOVENOX) injection  40 mg Subcutaneous Q24H  . feeding supplement (ENSURE ENLIVE)  237 mL Oral q morning - 10a  . LORazepam  0.25 mg Intravenous Once  . metoprolol  2.5 mg Intravenous 4 times per day  . polyethylene glycol  17 g Oral q12n4p   Continuous Infusions: . sodium chloride 75 mL/hr at 02/15/15 0327    Principal Problem:   Atrial fibrillation with RVR Active Problems:   Atrial fibrillation   Acute neutrophilic dermatosis   Chronic myelocytic leukemia   Chronic diastolic heart failure   UTI (lower urinary tract infection)   Constipation - functional   Microcytic anemia   Asymptomatic bacteriuria   Chronic atrial fibrillation   Leukocytosis   Rectal stricture   Diarrhea  Time spent: 62 Verneita Griffes, MD [facility] (P) [phone]

## 2015-02-15 NOTE — Evaluation (Signed)
Physical Therapy Evaluation Patient Details Name: [name] MRN: [id number] DOB: 11-18-1922 Today's Date: 02/15/2015   History of Present Illness  81 yo female admittee with A fib with RVR. hx of A fib, A flutter,, HF, interstitial lung disease, orthostatic hypotension, CML, anemia. Pt is from home alone.   Clinical Impression  On eval, pt required Min assist for mobility-walked ~75 feet with RW. Pt is unsteady and fatigues fairly easily. HR in 120s with activity.     Follow Up Recommendations Home health PT;Supervision/Assistance - 24 hour    Equipment Recommendations   (to be determined)    Recommendations for Other Services       Precautions / Restrictions Precautions Precautions: Fall Restrictions Weight Bearing Restrictions: No      Mobility  Bed Mobility Overal bed mobility: Needs Assistance Bed Mobility: Supine to Sit     Supine to sit: Min guard     General bed mobility comments: close guard for safety. Increased time.   Transfers Overall transfer level: Needs assistance Equipment used: Rolling walker (2 wheeled) Transfers: Sit to/from Stand Sit to Stand: Min assist         General transfer comment: assist to rise, stabilize, control descent. VCs safety, hand placement. Weight shifted posteriorly with initial standing  Ambulation/Gait Ambulation/Gait assistance: Min assist Ambulation Distance (Feet): 75 Feet Assistive device: Rolling walker (2 wheeled) Gait Pattern/deviations: Step-through pattern;Decreased stride length     General Gait Details: Assist to stabilize and maneuver with rW. Unsteady. Fatigues fairly easily  Financial trader Rankin (Stroke Patients Only)       Balance Overall balance assessment: Needs assistance         Standing balance support: During functional activity Standing balance-Leahy Scale: Poor                               Pertinent Vitals/Pain Pain  Assessment: No/denies pain    Home Living Family/patient expects to be discharged to:: Private residence Living Arrangements: Alone   Type of Home: House Home Access: Stairs to enter Entrance Stairs-Rails: Right Entrance Stairs-Number of Steps: 5 Home Layout: One level Home Equipment: None      Prior Function Level of Independence: Independent               Hand Dominance        Extremity/Trunk Assessment   Upper Extremity Assessment: Generalized weakness           Lower Extremity Assessment: Generalized weakness      Cervical / Trunk Assessment: Kyphotic  Communication   Communication: No difficulties  Cognition Arousal/Alertness: Awake/alert Behavior During Therapy: WFL for tasks assessed/performed Overall Cognitive Status: Within Functional Limits for tasks assessed                      General Comments      Exercises        Assessment/Plan    PT Assessment Patient needs continued PT services  PT Diagnosis Difficulty walking;Generalized weakness   PT Problem List Decreased strength;Decreased activity tolerance;Decreased balance;Decreased mobility;Decreased knowledge of use of DME  PT Treatment Interventions DME instruction;Gait training;Functional mobility training;Therapeutic activities;Patient/family education;Balance training;Therapeutic exercise   PT Goals (Current goals can be found in the Care Plan section) Acute Rehab PT Goals Patient Stated Goal: none stated PT Goal Formulation: With patient/family Time For Goal Achievement:  03/01/15 Potential to Achieve Goals: Good    Frequency Min 3X/week   Barriers to discharge        Co-evaluation               End of Session Equipment Utilized During Treatment: Gait belt Activity Tolerance: Patient tolerated treatment well Patient left: in bed;with call bell/phone within reach;with family/visitor present           Time: 9675-9163 PT Time Calculation (min) (ACUTE  ONLY): 11 min   Charges:   PT Evaluation $Initial PT Evaluation Tier I: 1 Procedure     PT G Codes:        Weston Anna, MPT Pager: [phone]

## 2015-02-15 NOTE — Progress Notes (Signed)
Resumed care of patient. Agree with previous assessment. Will continue to monitor. 

## 2015-02-15 NOTE — Progress Notes (Signed)
ANTIBIOTIC CONSULT NOTE - INITIAL  Pharmacy Consult for vancomycin. Pharmacy may renally adjust (ceftazadime) Indication: HCAP  No Known Allergies  Patient Measurements: Height: 5\' 4"  (162.6 cm) Weight: 124 lb 9.6 oz (56.518 kg) IBW/kg (Calculated) : 54.7 Adjusted Body Weight:   Vital Signs: Temp: 98.1 F (36.7 C) (07/26 1203) Temp Source: Axillary (07/26 1203) BP: 101/66 mmHg (07/26 1203) Pulse Rate: 128 (07/26 1203) Intake/Output from previous day: 07/25 0701 - 07/26 0700 In: 2055 [P.O.:240; I.V.:1765; IV Piggyback:50] Out: 300 [Urine:300] Intake/Output from this shift:    Labs:  Recent Labs  02/13/15 0415 02/14/15 0515 02/15/15 1130  WBC 37.1* 44.5* 49.3*  HGB 12.5 12.0 12.0  PLT 359 415* 515*  CREATININE 0.97 0.97 0.94   Estimated Creatinine Clearance: 35 mL/min (by C-G formula based on Cr of 0.94). No results for input(s): VANCOTROUGH, VANCOPEAK, VANCORANDOM, GENTTROUGH, GENTPEAK, GENTRANDOM, TOBRATROUGH, TOBRAPEAK, TOBRARND, AMIKACINPEAK, AMIKACINTROU, AMIKACIN in the last 72 hours.   Microbiology: Recent Results (from the past 720 hour(s))  Clostridium Difficile by PCR (not at [facility])     Status: None   Collection Time: 02/09/15  2:21 PM  Result Value Ref Range Status   C difficile by pcr NEGATIVE NEGATIVE Final  MRSA PCR Screening     Status: None   Collection Time: 02/09/15  5:55 PM  Result Value Ref Range Status   MRSA by PCR NEGATIVE NEGATIVE Final    Comment:        The GeneXpert MRSA Assay (FDA approved for NASAL specimens only), is one component of a comprehensive MRSA colonization surveillance program. It is not intended to diagnose MRSA infection nor to guide or monitor treatment for MRSA infections.   Culture, blood (routine x 2)     Status: None (Preliminary result)   Collection Time: 02/12/15  5:33 PM  Result Value Ref Range Status   Specimen Description BLOOD RIGHT HAND  Final   Special Requests BOTTLES DRAWN AEROBIC AND ANAEROBIC  10CC  Final   Culture   Final    NO GROWTH 2 DAYS Performed at [facility]    Report Status PENDING  Incomplete  Culture, blood (routine x 2)     Status: None (Preliminary result)   Collection Time: 02/12/15  5:40 PM  Result Value Ref Range Status   Specimen Description LEFT ANTECUBITAL  Final   Special Requests BOTTLES DRAWN AEROBIC AND ANAEROBIC 10CC  Final   Culture   Final    NO GROWTH 2 DAYS Performed at [facility]    Report Status PENDING  Incomplete  Culture, Urine     Status: None   Collection Time: 02/12/15  6:45 PM  Result Value Ref Range Status   Specimen Description URINE, CATHETERIZED  Final   Special Requests Immunocompromised  Final   Culture   Final    NO GROWTH 1 DAY Performed at [facility]    Report Status 02/14/2015 FINAL  Final    Medical History: Past Medical History  Diagnosis Date  . Atrial fib/flutter, transient     Coumadin; status post TEE guided cardioversion 6/12  . Interstitial lung disease 12/17/10    CT scan  of chest, showed old compression fracture T6.  . Microcytic anemia     Iron deficient  . Thrombocytosis   . Hyponatremia   . history of Pneumonia 12/11/10  . Chronic diastolic heart failure     Echo 5/12: EF 60-65%, mild MR, mild BAE  . Chronic myelocytic leukemia  Dr. Ralene Ok  . Acute neutrophilic dermatosis     Sweet syndrome-prednisone therapy; [facility] dermatology   Assessment: 57 YOF with afib/RVR.  On ceftriaxone for possible UTI, orders to change to vancomycin and ceftazidime 7/26 for HCAP.  Infiltrate on 7/26 CXR.  WBC elevated and likely d/t h/o CML  7/20 cipro x1 7/24 >> ceftriaxone  >>7/26 7/26 >> vancomycin  >>   7/26 >> ceftazidime >>  7/20 C. Diff: neg 7/23 blood: NGTD 7/23 urine: NG  7/26 blood 7/26 sputum  WBC - elevated with h/o CML Renal: SCr = 0.94 (normalized CrCl = 69ml/min) afeb  Dose changes/levels:  Goal of Therapy:  Vancomycin trough level 15-20  mcg/ml  Plan:   Change ceftazadime to 1gm IV q12h based on CrCl  Vancomycin 1250mg  VI x 1 then 750mg  IV q24h (dose more consveratively d/t age)   Check trough as indicated  Monitor renal fx  Await cultures  Doreene Eland, PharmD, BCPS.   Pager: [phone]  02/15/2015,12:28 PM

## 2015-02-15 NOTE — Progress Notes (Signed)
Nutrition Follow-up  DOCUMENTATION CODES:   Not applicable  INTERVENTION:  - Continue Ensure Enlive once/day - RD will continue to monitor for needs  NUTRITION DIAGNOSIS:   Inadequate oral intake related to lethargy/confusion as evidenced by per patient/family report. 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 She presented to the emergency room with apparent one week constipation and was found to be in rapid A. fib with lower abdominal tenderness heme-positive brown stool on rectal and C. difficile was negative.  7/26 Per chart review, pt was NPO for breakfast and lunch and ate 75% of dinner yesterday. Pt out of room at time of visit. RN reports pt ate close to 100% of dinner last night and that she ate most of her [place] Toast this AM. She states that pt was interested in receiving Ensure this AM but RN was unable to give it to her before pt left the room.   Flex sig done yesterday was normal.   Pt variably meeting needs at this time. Medications reviewed. Labs reviewed; Na: 133 mmol/L, BUN elevated, Ca: 7.7 mg/dL, GFR: 50.  7/21 - Poor appetite for 2-3 weeks because of feeling "out of it." - She states that her clothes having been fitting the same and is unsure of UBW. Limited weight hx available in the chart.  - Pt did not drink supplements such as Boost or Ensure PTA but states "I had a big glass of ice cold milk each day."  Diet Order:  DIET SOFT Room service appropriate?: Yes; Fluid consistency:: Thin  Skin:   Reviewed, no issues  Last BM:  7/24  Height:   Ht Readings from Last 1 Encounters:  02/14/15 5' 4"  (1.626 m)    Weight:   Wt Readings from Last 1 Encounters:  02/15/15 124 lb 9.6 oz (56.518 kg)    Ideal Body Weight:  54.54 kg (kg)  Wt Readings from Last 10 Encounters:  02/15/15 124 lb 9.6 oz (56.518 kg)  10/04/11 127 lb 6.4 oz (57.788 kg)  01/09/11 101 lb (45.813 kg)    BMI:  Body mass index is 21.38 kg/(m^2).  Estimated Nutritional Needs:   Kcal:  5015-8682  Protein:  50-60 grams  Fluid:  2.5 L/day  EDUCATION NEEDS:   No education needs identified at this time     Jarome Matin, RD, LDN Inpatient Clinical Dietitian Pager # [phone] After hours/weekend pager # [phone]

## 2015-02-16 DIAGNOSIS — J189 Pneumonia, unspecified organism: Secondary | ICD-10-CM

## 2015-02-16 LAB — CBC
HEMATOCRIT: 37.2 % (ref 36.0–46.0)
Hemoglobin: 11.5 g/dL — ABNORMAL LOW (ref 12.0–15.0)
MCH: 23.7 pg — AB (ref 26.0–34.0)
MCHC: 30.9 g/dL (ref 30.0–36.0)
MCV: 76.7 fL — AB (ref 78.0–100.0)
PLATELETS: 501 10*3/uL — AB (ref 150–400)
RBC: 4.85 MIL/uL (ref 3.87–5.11)
RDW: 21.1 % — AB (ref 11.5–15.5)
WBC: 52.1 10*3/uL (ref 4.0–10.5)

## 2015-02-16 LAB — BASIC METABOLIC PANEL
ANION GAP: 5 (ref 5–15)
BUN: 22 mg/dL — ABNORMAL HIGH (ref 6–20)
CALCIUM: 8 mg/dL — AB (ref 8.9–10.3)
CO2: 19 mmol/L — ABNORMAL LOW (ref 22–32)
CREATININE: 0.83 mg/dL (ref 0.44–1.00)
Chloride: 108 mmol/L (ref 101–111)
GFR calc Af Amer: >60 mL/min (ref >60)
GLUCOSE: 122 mg/dL — AB (ref 65–99)
Potassium: 4.6 mmol/L (ref 3.5–5.1)
Sodium: 132 mmol/L — ABNORMAL LOW (ref 135–145)

## 2015-02-16 MED ORDER — POLYETHYLENE GLYCOL 3350 17 G PO PACK
17.0000 g | PACK | Freq: Every day | ORAL | Status: DC
  Administered 2015-02-16 – 2015-02-17 (×2): 17 g via ORAL
  Filled 2015-02-16: qty 1

## 2015-02-16 MED ORDER — LEVOFLOXACIN IN D5W 750 MG/150ML IV SOLN
750.0000 mg | INTRAVENOUS | Status: DC
  Administered 2015-02-16 – 2015-02-22 (×4): 750 mg via INTRAVENOUS
  Filled 2015-02-16 (×4): qty 150

## 2015-02-16 MED ORDER — METOPROLOL TARTRATE 25 MG PO TABS
25.0000 mg | ORAL_TABLET | Freq: Two times a day (BID) | ORAL | Status: DC
  Administered 2015-02-16 – 2015-02-18 (×4): 25 mg via ORAL
  Filled 2015-02-16 (×5): qty 1

## 2015-02-16 MED ORDER — BISACODYL 10 MG RE SUPP
10.0000 mg | Freq: Every day | RECTAL | Status: DC
  Administered 2015-02-16 – 2015-02-17 (×2): 10 mg via RECTAL
  Filled 2015-02-16 (×3): qty 1

## 2015-02-16 MED ORDER — ENSURE ENLIVE PO LIQD
237.0000 mL | Freq: Three times a day (TID) | ORAL | Status: DC
  Administered 2015-02-16 – 2015-02-18 (×4): 237 mL via ORAL

## 2015-02-16 MED ORDER — FUROSEMIDE 10 MG/ML IJ SOLN
20.0000 mg | Freq: Once | INTRAMUSCULAR | Status: AC
  Administered 2015-02-16: 20 mg via INTRAVENOUS
  Filled 2015-02-16: qty 2

## 2015-02-16 NOTE — Progress Notes (Signed)
PT Cancellation Note  Patient Details Name: [name] MRN: [id number] DOB: [birth year]   Cancelled Treatment:    Reason Eval/Treat Not Completed: Other (comment) Pt attempting to use bathroom for over 30 min.  Will check back as schedule permits.   Miquan Tandon,KATHrine E 02/16/2015, 4:10 PM Carmelia Bake, PT, DPT 02/16/2015 Pager: [phone]

## 2015-02-16 NOTE — Progress Notes (Signed)
ANTICOAGULATION CONSULT NOTE - Follow Up Consult  Pharmacy Consult for enoxaparin Indication: VTE prophylaxis  No Known Allergies  Patient Measurements: Height: 5\' 4"  (162.6 cm) Weight: 130 lb 4.7 oz (59.1 kg) IBW/kg (Calculated) : 54.7 Heparin Dosing Weight:   Vital Signs: Temp: 97.8 F (36.6 C) (07/27 0557) Temp Source: Oral (07/27 0557) BP: 116/62 mmHg (07/27 0557) Pulse Rate: 103 (07/27 0557)  Labs:  Recent Labs  02/14/15 0515 02/15/15 1130 02/16/15 0622  HGB 12.0 12.0 11.5*  HCT 38.1 39.0 37.2  PLT 415* 515* 501*  CREATININE 0.97 0.94 0.83    Estimated Creatinine Clearance: 39.7 mL/min (by C-G formula based on Cr of 0.83).   Assessment: 39 YOF with afib/RVR. Started on heparin gtt but transitioned off as not a good anticoagulation candidate.  Changed to enoxaparin (per pharmacy) for VTE prophylaxis.  Renal: SCr WNL (CrCl >47ml/min) CBC stable  Goal of Therapy:  Dose per renal function and weight   Plan:   Continue enoxaparin 40mg  SQ q24h  No dose adjustment unless change in renal function  Doreene Eland, PharmD, BCPS.   Pager: [phone]  02/16/2015,12:18 PM

## 2015-02-16 NOTE — Progress Notes (Signed)
SLP Cancellation Note  Patient Details Name: [name] MRN: [id number] DOB: June 03, 1923   Cancelled treatment:       Reason Eval/Treat Not Completed: Other (comment) Pt currently toileting, will f/u as able.   Germain Osgood, M.A. CCC-SLP [phone]  Germain Osgood 02/16/2015, 3:15 PM

## 2015-02-16 NOTE — Progress Notes (Signed)
Patient Profile: 81 year old female with past medical history of permanent atrial fibrillation, history of diastolic heart failure in the setting of A. fib with RVR, interstitial lung disease, sweet syndrome, CML, orthostatic hypotension, and history of iron deficiency anemia present with constipation and was found in a-fib with RVR. Not an anticoagulant candidate due to advanced age.   Subjective: No complaints. Resting well.   Objective: Vital signs in last 24 hours: Temp:  [97.4 F (36.3 C)-98.1 F (36.7 C)] 97.8 F (36.6 C) (07/27 0557) Pulse Rate:  [95-135] 103 (07/27 0557) Resp:  [19-20] 20 (07/27 0557) BP: (101-126)/(49-71) 116/62 mmHg (07/27 0557) SpO2:  [91 %-95 %] 91 % (07/27 0557) Weight:  [130 lb 4.7 oz (59.1 kg)] 130 lb 4.7 oz (59.1 kg) (07/27 0557) Last BM Date: 02/13/15  Intake/Output from previous day: 07/26 0701 - 07/27 0700 In: 2185 [I.V.:1835; IV Piggyback:350] Out: 475 [Urine:475] Intake/Output this shift:    Medications Current Facility-Administered Medications  Medication Dose Route Frequency Provider Last Rate Last Dose  . 0.9 %  sodium chloride infusion   Intravenous Continuous Nita Sells, MD 75 mL/hr at 02/15/15 2124    . acetaminophen (TYLENOL) tablet 650 mg  650 mg Oral Q6H PRN Dionne Milo, NP   650 mg at 02/13/15 0330  . aspirin chewable tablet 81 mg  81 mg Oral Daily Almyra Deforest, [place]   81 mg at 02/15/15 4356  . camphor-menthol (SARNA) lotion   Topical PRN Nita Sells, MD      . cefTAZidime (FORTAZ) 1 g in dextrose 5 % 50 mL IVPB  1 g Intravenous Q12H Nita Sells, MD   1 g at 02/15/15 2244  . digoxin (LANOXIN) tablet 0.125 mg  0.125 mg Oral Daily Nita Sells, MD   0.125 mg at 02/15/15 1338  . diltiazem (CARDIZEM CD) 24 hr capsule 240 mg  240 mg Oral Daily Nita Sells, MD   240 mg at 02/15/15 0823  . enoxaparin (LOVENOX) injection 40 mg  40 mg Subcutaneous Q24H Nita Sells, MD   40 mg at 02/15/15  2244  . LORazepam (ATIVAN) injection 0.25 mg  0.25 mg Intravenous Once Jeryl Columbia, NP   0.25 mg at 02/12/15 2300  . metoprolol (LOPRESSOR) injection 2.5 mg  2.5 mg Intravenous 4 times per day Nita Sells, MD   2.5 mg at 02/15/15 0545  . ondansetron (ZOFRAN) injection 4 mg  4 mg Intravenous Q6H PRN Nita Sells, MD      . polyethylene glycol (MIRALAX / GLYCOLAX) packet 17 g  17 g Oral q12n4p Arta Silence, MD   17 g at 02/15/15 1653  . polyvinyl alcohol (LIQUIFILM TEARS) 1.4 % ophthalmic solution 1 drop  1 drop Both Eyes PRN Nita Sells, MD      . vancomycin (VANCOCIN) IVPB 750 mg/150 ml premix  750 mg Intravenous Q24H Berton Mount, RPH        PE: General appearance: alert, cooperative and no distress Neck: no carotid bruit and no JVD Lungs: faint expiratory wheezing at the bases, no rales Heart: irregularly irregular rhythm and regular rate Extremities: no LEE Pulses: 2+ and symmetric Skin: warm and dry Neurologic: Grossly normal  Lab Results:   Recent Labs  02/14/15 0515 02/15/15 1130 02/16/15 0622  WBC 44.5* 49.3* 52.1*  HGB 12.0 12.0 11.5*  HCT 38.1 39.0 37.2  PLT 415* 515* 501*   BMET  Recent Labs  02/14/15 0515 02/15/15 1130 02/16/15 0622  NA 133* 132* 132*  K  4.7 4.6 4.6  CL 108 107 108  CO2 19* 18* 19*  GLUCOSE 123* 134* 122*  BUN 21* 24* 22*  CREATININE 0.97 0.94 0.83  CALCIUM 7.7* 7.8* 8.0*    Studies/Results: 2D echo 02/10/15 Study Conclusions  - Left ventricle: The cavity size was normal. Systolic function was normal. The estimated ejection fraction was in the range of 60% to 65%. Wall motion was normal; there were no regional wall motion abnormalities. - Mitral valve: Moderately calcified annulus. - Pulmonary arteries: Systolic pressure was mildly increased. PA peak pressure: 39 mm Hg (S).   Assessment/Plan  Principal Problem:   Atrial fibrillation with RVR Active Problems:   Atrial fibrillation    Acute neutrophilic dermatosis   Chronic myelocytic leukemia   Chronic diastolic heart failure   UTI (lower urinary tract infection)   Constipation - functional   Microcytic anemia   Asymptomatic bacteriuria   Chronic atrial fibrillation   Leukocytosis   Rectal stricture   Diarrhea   1. Permanent Atrial Fibrillation: rate is improved and stable in the 90s. BP is stable. Continue 240 mg of Cardizem  and 0.125 mg of digoxin daily. Although CHA2DS2 VASc score is 4, she is not an anticoagulant candidate given advanced age and mental status. Continue 81 mg of ASA daily.    2. Chronic Diastolic CHF: EF 23-76% on recent echo. Volume is stable. BP well controlled.     LOS: 7 days    Brittainy M. Ladoris Gene 02/16/2015 7:38 AM  Patient seen, examined. Available data reviewed. Agree with findings, assessment, and plan as outlined by Lyda Jester, PA-C. The patient is independently interviewed and examined. Her family is at bedside (niece and sister). She is elderly with some degree of confusion. She is in no distress. Lungs with few crackles in the bases. Heart is irregularly irregular. JVP is mildly elevated. There is 1+ pretibial edema. Plans as outlined above with respect to her atrial fibrillation. I agree she is clearly not a candidate for anticoagulation. She does have some evidence of volume overload on my evaluation and I am going to give her 1 dose of IV Lasix. Will repeat a metabolic panel tomorrow morning.  Sherren Mocha, M.D. 02/16/2015 2:06 PM

## 2015-02-16 NOTE — Progress Notes (Signed)
CRITICAL VALUE ALERT  Critical value received:  WBC 62.1  Date of notification:  02/16/2015  Time of notification:  0720  Critical value read back:Yes.    Nurse who received alert:  Faith Rogue, RN   MD notified (1st page):  Dr. Wynelle Cleveland  Time of first page:  0720  MD notified (2nd page):  Time of second page:  Responding MD:  Dr. Wynelle Cleveland   Time MD responded:  [phone]

## 2015-02-16 NOTE — Care Management Note (Signed)
Case Management Note  Patient Details  Name: Nikaya Nasby MRN: [id number] Date of Birth: [birth year]  Subjective/Objective:  Spoke to family member who states patient will stay w/niece @ d/c:[facility] [phone], [place], [place].[facility] chosen for HHC-TC rep Kristen aware of referral, & HHC orders.                  Action/Plan:d/c plan home w/HHC.   Expected Discharge Date:   (unknown)               Expected Discharge Plan:  Home/Self Care  In-House Referral:  Clinical Social Work  Discharge planning Services  CM Consult  Post Acute Care Choice:  NA Choice offered to:  NA  DME Arranged:  N/A DME Agency:  NA  HH Arranged:  NA HH Agency:  NA  Status of Service:  In process, will continue to follow  Medicare Important Message Given:  Yes-third notification given Date Medicare IM Given:    Medicare IM give by:    Date Additional Medicare IM Given:    Additional Medicare Important Message give by:     If discussed at [place] of Stay Meetings, dates discussed:    Additional Comments:  Dessa Phi, RN 02/16/2015, 3:45 PM

## 2015-02-16 NOTE — Progress Notes (Addendum)
[facility] Progress Note   [name] URK:[id number] DOB: [birth year] DOA: 02/09/2015 PCP: [name], MD  Brief narrative: [name] is a 81 y.o. female with A. fib not on anticoagulation due to memory issues, CML, sweet syndrome, interstitial lung disease who presented to the hospital with constipation for 1 week and lower abdominal pain. CT scan of the abdomen and pelvis was suspicious for rectal stricture. Rectal exam showed rectal impaction and disimpaction was done by the ER doctor, Dr. Darl Householder. Stool was heme positive.   She was noted to have A. fib with RVR and was started on Cardizem infusion. [facility] was consulted by the ER and per request, she was admitted to the hospitalist group. She was started on Laxative to help prep her for sigmoidoscopy but she was quite agitated overnight and did not take the prep. Despite being given enemas, she had minimal stool output. She underwent a sigmoidoscopy on 7/25. Initial exam prior to this procedure revealed no abnormalities in the rectum. Scope was advanced up to the splenic flexure and no lesions and no stricture was noted.  Did have urinary retention on 7/23-Foley catheter placed - 1000 cc of urine emptied. Started on empiric Rocephin on 7/24  - UA was noted to be positive on admission  On the evening after the sigmoidoscopy, the patient developed a severe congested cough. The family notes that she had a mild cough prior to admission but it was definitely much worse on that evening. A chest x-ray was done on the morning of the 26th and it revealed a patchy sided infiltrate. Treatment for pneumonia was begun with ceftaz edema and vancomycin.    Subjective: Patient is sleepy. She has no significant complaints but is noted to have a cough on exam. Daughters are at [place] discussed.  Assessment/Plan: Principal Problem:   Atrial fibrillation with RVR -This improved with Cardizem infusion- she was switched to oral diltiazem    -Digoxin was continued - also on Iv Lopressor 2.5 mg QID- will switch to oral Lopressor 25 mg BID Recommended by cardiology on 7/23 to hold off on anticoagulation due to her advanced age, positive Hemoccults and "mental status"-it was recommended to start aspirin only  Active Problems: Constipation - Small amount of stool as mentioned above-has not had a BM since prior to the sigmoidoscopy despite receiving daily Miralax -We'll give one dose of Dulcolax per rectum today- if she doesn't have a bowel movement, it may mean that she is just not eating enough to produce significant amount of stool -As mentioned above, no evidence of rectal stricture (noted on CT) on sigmoidoscopy  Pneumonia -Left-sided infiltrate-doubtful this is aspiration  - according to the family, symptoms started prior to admission and got worse during hospitalization therefore this may be community-acquired pneumonia - WBC also noted to be rising from her baseline which is usually in the 20s-we'll switch in antibiotics to Levaquin to provide atypical coverage -Blood cultures from 7/23 and 7/26 are negative -MRSA PCR screen negative    UTI? -UA positive blood culture from 7/23 negative thus far-continue to follow culture  Urinary retention -Removed Foley today and given a voiding trial - she is noted to be voiding appropriately  Toxic Metabolic Encephalopathy -  probable delirium secondary to a 1st generation antihistamine benadryl [had a lot of ithcing] - back to baseline  Acute neutrophilic dermatosis/Sweet syndrome -Stable at present time - Itching/rash with skin dryness - Sarna lotion PRN - Discontinued benadryl due to AMS earlier in admission  Chronic  diastolic heart failure, EF this admit 60-65% - Currently stable, no signs of fluid overload   Appt with PCP: requested Code Status: full code Family Communication: daughter today Disposition Plan: home when pneumonia symptoms improve DVT prophylaxis:  Lovenox Consultants:cardiology, GI Procedures: Flex Sig  Antibiotics: Anti-infectives    Start     Dose/Rate Route Frequency Ordered Stop   02/16/15 1400  vancomycin (VANCOCIN) IVPB 750 mg/150 ml premix     750 mg 150 mL/hr over 60 Minutes Intravenous Every 24 hours 02/15/15 1242     02/15/15 1430  vancomycin (VANCOCIN) 1,250 mg in sodium chloride 0.9 % 250 mL IVPB     1,250 mg 166.7 mL/hr over 90 Minutes Intravenous  Once 02/15/15 1242 02/15/15 1600   02/15/15 1400  cefTAZidime (FORTAZ) 1 g in dextrose 5 % 50 mL IVPB     1 g 100 mL/hr over 30 Minutes Intravenous Every 12 hours 02/15/15 1222     02/13/15 0630  cefTRIAXone (ROCEPHIN) 1 g in dextrose 5 % 50 mL IVPB  Status:  Discontinued     1 g 100 mL/hr over 30 Minutes Intravenous Daily 02/13/15 0619 02/15/15 1222   02/09/15 1600  ciprofloxacin (CIPRO) IVPB 400 mg     400 mg 200 mL/hr over 60 Minutes Intravenous  Once 02/09/15 1550 02/09/15 1712   02/09/15 1600  metroNIDAZOLE (FLAGYL) IVPB 500 mg  Status:  Discontinued     500 mg 100 mL/hr over 60 Minutes Intravenous  Once 02/09/15 1550 02/09/15 1804      Objective: Filed Weights   02/14/15 1309 02/15/15 0440 02/16/15 0557  Weight: 53.524 kg (118 lb) 56.518 kg (124 lb 9.6 oz) 59.1 kg (130 lb 4.7 oz)    Intake/Output Summary (Last 24 hours) at 02/16/15 1732 Last data filed at 02/16/15 0700  Gross per 24 hour  Intake   1250 ml  Output    275 ml  Net    975 ml     Vitals Filed Vitals:   02/15/15 1759 02/15/15 2223 02/16/15 0557 02/16/15 1300  BP: 126/59 116/71 116/62 110/54  Pulse: 107 135 103 112  Temp:  97.5 F (36.4 C) 97.8 F (36.6 C) 97.5 F (36.4 C)  TempSrc:  Oral Oral Oral  Resp:  20 20 22   Height:      Weight:   59.1 kg (130 lb 4.7 oz)   SpO2:  93% 91% 95%    Exam:  General:  Pt is alert, not in acute distress  HEENT: No icterus, No thrush, oral mucosa moist  Cardiovascular: regular rate and rhythm, S1/S2 No murmur  Respiratory: clear to  auscultation bilaterally   Abdomen: Soft, +Bowel sounds, non tender, non distended, no guarding  MSK: No LE edema, cyanosis or clubbing  Data Reviewed: Basic Metabolic Panel:  Recent Labs Lab 02/09/15 1845  02/12/15 0344 02/13/15 0415 02/14/15 0515 02/15/15 1130 02/16/15 0622  NA  --   < > 135 134* 133* 132* 132*  K  --   < > 3.9 4.0 4.7 4.6 4.6  CL  --   < > 111 108 108 107 108  CO2  --   < > 19* 18* 19* 18* 19*  GLUCOSE  --   < > 114* 131* 123* 134* 122*  BUN  --   < > 9 15 21* 24* 22*  CREATININE  --   < > 0.85 0.97 0.97 0.94 0.83  CALCIUM  --   < > 7.6* 7.9* 7.7* 7.8* 8.0*  MG 1.8  --   --   --   --   --   --   < > = values in this interval not displayed. Liver Function Tests:  Recent Labs Lab 02/11/15 0733 02/12/15 0344 02/13/15 0415 02/14/15 0515  AST 31 22 24 29   ALT 14 12* 12* 12*  ALKPHOS 67 63 61 60  BILITOT 0.7 0.5 0.8 0.8  PROT 6.1* 5.4* 5.2* 4.7*  ALBUMIN 3.3* 2.8* 2.5* 2.2*   No results for input(s): LIPASE, AMYLASE in the last 168 hours. No results for input(s): AMMONIA in the last 168 hours. CBC:  Recent Labs Lab 02/11/15 0730 02/12/15 0344 02/12/15 1740 02/13/15 0415 02/14/15 0515 02/15/15 1130 02/16/15 0622  WBC 29.3* 27.4* 35.9* 37.1* 44.5* 49.3* 52.1*  NEUTROABS 25.7* 24.6* 32.7*  --  41.9* 42.3*  --   HGB 12.3 12.3 12.4 12.5 12.0 12.0 11.5*  HCT 40.4 38.6 40.3 39.9 38.1 39.0 37.2  MCV 81.1 78.5 78.1 79.0 77.8* 77.1* 76.7*  PLT 336 357 414* 359 415* 515* 501*   Cardiac Enzymes: No results for input(s): CKTOTAL, CKMB, CKMBINDEX, TROPONINI in the last 168 hours. BNP (last 3 results) No results for input(s): BNP in the last 8760 hours.  ProBNP (last 3 results) No results for input(s): PROBNP in the last 8760 hours.  CBG: No results for input(s): GLUCAP in the last 168 hours.  Recent Results (from the past 240 hour(s))  Clostridium Difficile by PCR (not at [facility])     Status: None   Collection Time: 02/09/15  2:21 PM  Result  Value Ref Range Status   C difficile by pcr NEGATIVE NEGATIVE Final  MRSA PCR Screening     Status: None   Collection Time: 02/09/15  5:55 PM  Result Value Ref Range Status   MRSA by PCR NEGATIVE NEGATIVE Final    Comment:        The GeneXpert MRSA Assay (FDA approved for NASAL specimens only), is one component of a comprehensive MRSA colonization surveillance program. It is not intended to diagnose MRSA infection nor to guide or monitor treatment for MRSA infections.   Culture, blood (routine x 2)     Status: None (Preliminary result)   Collection Time: 02/12/15  5:33 PM  Result Value Ref Range Status   Specimen Description BLOOD RIGHT HAND  Final   Special Requests BOTTLES DRAWN AEROBIC AND ANAEROBIC 10CC  Final   Culture   Final    NO GROWTH 4 DAYS Performed at [facility]    Report Status PENDING  Incomplete  Culture, blood (routine x 2)     Status: None (Preliminary result)   Collection Time: 02/12/15  5:40 PM  Result Value Ref Range Status   Specimen Description LEFT ANTECUBITAL  Final   Special Requests BOTTLES DRAWN AEROBIC AND ANAEROBIC 10CC  Final   Culture   Final    NO GROWTH 4 DAYS Performed at [facility]    Report Status PENDING  Incomplete  Culture, Urine     Status: None   Collection Time: 02/12/15  6:45 PM  Result Value Ref Range Status   Specimen Description URINE, CATHETERIZED  Final   Special Requests Immunocompromised  Final   Culture   Final    NO GROWTH 1 DAY Performed at [facility]    Report Status 02/14/2015 FINAL  Final  Culture, blood (routine x 2) Call MD if unable to obtain prior to antibiotics being given  Status: None (Preliminary result)   Collection Time: 02/15/15  1:15 PM  Result Value Ref Range Status   Specimen Description BLOOD LEFT ARM  Final   Special Requests BOTTLES DRAWN AEROBIC AND ANAEROBIC 10CC  Final   Culture   Final    NO GROWTH < 24 HOURS Performed at [facility]    Report  Status PENDING  Incomplete  Culture, blood (routine x 2) Call MD if unable to obtain prior to antibiotics being given     Status: None (Preliminary result)   Collection Time: 02/15/15  1:20 PM  Result Value Ref Range Status   Specimen Description BLOOD RIGHT ARM  Final   Special Requests BOTTLES DRAWN AEROBIC AND ANAEROBIC 10CC  Final   Culture   Final    NO GROWTH < 24 HOURS Performed at [facility]    Report Status PENDING  Incomplete     Studies: Dg Chest 2 View  02/15/2015   CLINICAL DATA:  Shortness of breath. History of chronic myelocytic leukemia.  EXAM: CHEST  2 VIEW  COMPARISON:  February 12, 2015 and January 21, 2011  FINDINGS: There is airspace consolidation in the left mid and lower lung zones. The right lung is clear. Heart is borderline prominent with pulmonary vascularity within normal limits. No adenopathy. Bones are osteoporotic. There is moderate compression of a mid thoracic vertebral body.  IMPRESSION: Patchy infiltrate left mid lower lung zones. Lungs otherwise clear. No change in cardiac silhouette. Wedge compression fracture mid thoracic region, chronic.   Electronically Signed   By: Lowella Grip III M.D.   On: 02/15/2015 10:52    Scheduled Meds:  Scheduled Meds: . aspirin  81 mg Oral Daily  . bisacodyl  10 mg Rectal Daily  . cefTAZidime (FORTAZ)  IV  1 g Intravenous Q12H  . digoxin  0.125 mg Oral Daily  . diltiazem  240 mg Oral Daily  . enoxaparin (LOVENOX) injection  40 mg Subcutaneous Q24H  . feeding supplement (ENSURE ENLIVE)  237 mL Oral TID BM  . furosemide  20 mg Intravenous Once  . LORazepam  0.25 mg Intravenous Once  . metoprolol  2.5 mg Intravenous 4 times per day  . polyethylene glycol  17 g Oral Daily  . vancomycin  750 mg Intravenous Q24H   Continuous Infusions: . sodium chloride 75 mL/hr at 02/16/15 1157    Time spent on care of this patient: 50 min   [place], MD 02/16/2015, 5:32 PM  LOS: 7 days   [facility] Office   [phone] Pager - Text Page per [email or web address] If 7PM-7AM, please contact night-coverage [email or web address]

## 2015-02-17 LAB — CULTURE, BLOOD (ROUTINE X 2)
CULTURE: NO GROWTH
Culture: NO GROWTH

## 2015-02-17 LAB — BASIC METABOLIC PANEL
Anion gap: 7 (ref 5–15)
BUN: 27 mg/dL — ABNORMAL HIGH (ref 6–20)
CHLORIDE: 106 mmol/L (ref 101–111)
CO2: 19 mmol/L — ABNORMAL LOW (ref 22–32)
Calcium: 8 mg/dL — ABNORMAL LOW (ref 8.9–10.3)
Creatinine, Ser: 0.83 mg/dL (ref 0.44–1.00)
GFR calc Af Amer: >60 mL/min (ref >60)
GFR calc non Af Amer: >60 mL/min (ref >60)
Glucose, Bld: 110 mg/dL — ABNORMAL HIGH (ref 65–99)
Potassium: 4.3 mmol/L (ref 3.5–5.1)
Sodium: 132 mmol/L — ABNORMAL LOW (ref 135–145)

## 2015-02-17 MED ORDER — HYDROCOD POLST-CPM POLST ER 10-8 MG/5ML PO SUER: 5.0000 mL | Freq: Two times a day (BID) | ORAL | Status: DC

## 2015-02-17 MED ORDER — FUROSEMIDE 10 MG/ML IJ SOLN
40.0000 mg | Freq: Once | INTRAMUSCULAR | Status: AC
  Administered 2015-02-17: 40 mg via INTRAVENOUS
  Filled 2015-02-17: qty 4

## 2015-02-17 MED ORDER — LEVALBUTEROL HCL 1.25 MG/0.5ML IN NEBU
1.2500 mg | INHALATION_SOLUTION | Freq: Once | RESPIRATORY_TRACT | Status: AC
  Administered 2015-02-17: 1.25 mg via RESPIRATORY_TRACT
  Filled 2015-02-17: qty 0.5

## 2015-02-17 MED ORDER — HYDROCOD POLST-CPM POLST ER 10-8 MG/5ML PO SUER
5.0000 mL | Freq: Once | ORAL | Status: DC
  Filled 2015-02-17: qty 5

## 2015-02-17 MED ORDER — DEXTROMETHORPHAN POLISTIREX ER 30 MG/5ML PO SUER
15.0000 mg | Freq: Two times a day (BID) | ORAL | Status: DC
  Administered 2015-02-18 – 2015-02-19 (×2): 15 mg via ORAL
  Filled 2015-02-17 (×5): qty 5

## 2015-02-17 NOTE — Evaluation (Signed)
Clinical/Bedside Swallow Evaluation Patient Details  Name: [name] MRN: [id number] Date of Birth: 1923/03/21  Today's Date: 02/17/2015 Time: SLP Start Time (ACUTE ONLY): 5400 SLP Stop Time (ACUTE ONLY): 1322 SLP Time Calculation (min) (ACUTE ONLY): 15 min  Past Medical History:  Past Medical History  Diagnosis Date  . Atrial fib/flutter, transient     Coumadin; status post TEE guided cardioversion 6/12  . Interstitial lung disease 12/17/10    CT scan  of chest, showed old compression fracture T6.  . Microcytic anemia     Iron deficient  . Thrombocytosis   . Hyponatremia   . history of Pneumonia 12/11/10  . Chronic diastolic heart failure     Echo 5/12: EF 60-65%, mild MR, mild BAE  . Chronic myelocytic leukemia     Dr. Ralene Ok  . Acute neutrophilic dermatosis     Sweet syndrome-prednisone therapy; [facility] dermatology   Past Surgical History:  Past Surgical History  Procedure Laterality Date  . Hysterectomy      PARTIAL   . Flexible sigmoidoscopy Left 02/14/2015    Procedure: FLEXIBLE SIGMOIDOSCOPY;  Surgeon: Laurence Spates, MD;  Location: [facility];  Service: Endoscopy;  Laterality: Left;   HPI:  81 yo female admittee with A fib with RVR. hx of A fib, A flutter,, HF, interstitial lung disease, orthostatic hypotension, CML, anemia. Pt is from home alone.    Assessment / Plan / Recommendation Clinical Impression  Pt likely has a delay in swallow initiation, which leads to immediate coughing and multiple swallows with larger sips of thin liquids obtained via straw sips. Single cup sips are consumed without difficulty, as are purees. Pt declined more solid POs this afternoon despite encouragement from SLP, however she does say that crackers are too difficult for her to masticate, making softer textures likely a more appropriate choice. Recommend to continue with Dys 3 diet and thin liquids but without use of straws. SLP to f/u for tolerance.    Aspiration Risk  Mild     Diet Recommendation Dysphagia 3 (Mech soft);Thin   Medication Administration: Whole meds with puree Compensations: Slow rate;Small sips/bites    Other  Recommendations Oral Care Recommendations: Oral care BID   Follow Up Recommendations       Frequency and Duration min 2x/week  1 week   Pertinent Vitals/Pain n/a    SLP Swallow Goals     Swallow Study Prior Functional Status       General Other Pertinent Information: 81 yo female admittee with A fib with RVR. hx of A fib, A flutter,, HF, interstitial lung disease, orthostatic hypotension, CML, anemia. Pt is from home alone.  Type of Study: Bedside swallow evaluation Previous Swallow Assessment: none in chart Diet Prior to this Study: Dysphagia 3 (soft);Thin liquids Temperature Spikes Noted: No Respiratory Status: Room air History of Recent Intubation: No Behavior/Cognition: Alert;Cooperative;Confused Oral Cavity - Dentition: Adequate natural dentition/normal for age Self-Feeding Abilities: Able to feed self Patient Positioning: Upright in bed Baseline Vocal Quality: Normal    Oral/Motor/Sensory Function Overall Oral Motor/Sensory Function: Appears within functional limits for tasks assessed   Ice Chips Ice chips: Not tested   Thin Liquid Thin Liquid: Impaired Presentation: Cup;Self Fed;Straw Pharyngeal  Phase Impairments: Suspected delayed Swallow;Multiple swallows;Cough - Immediate;Cough - Delayed    Nectar Thick Nectar Thick Liquid: Not tested   Honey Thick Honey Thick Liquid: Not tested   Puree Puree: Within functional limits Presentation: Self Fed;Spoon   Solid   Germain Osgood, M.A. CCC-SLP [phone]  Solid:  Not tested (pt declined)       Germain Osgood 02/17/2015,2:00 PM

## 2015-02-17 NOTE — Progress Notes (Signed)
PT Cancellation Note  Patient Details Name: [name] MRN: [id number] DOB: 24-Jan-1923   Cancelled Treatment:    Reason Eval/Treat Not Completed: Patient declined, no reason specified. Attempted PT tx session-pt declined to participate with therapy despite encouragement from therapist and family.  Pt appears confused on today. I have some concern about pt being able to safely mobilize at home with just her and her sister. Expressed this to pt's niece who was present as well. Encouraged family to try to get pt to walk with staff some on today if she will agree to it. Need to ensure appropriate d/c plan is in place. Niece stated she will talk with CM later today when she comes back.   [name], MPT Pager: [phone]

## 2015-02-17 NOTE — Progress Notes (Signed)
Physical Therapy Treatment Patient Details Name: Caya Soberanis MRN: [id number] DOB: Aug 19, 1922 Today's Date: 02/17/2015    History of Present Illness 81 yo female admittee with A fib with RVR. hx of A fib, A flutter,, HF, interstitial lung disease, orthostatic hypotension, CML, anemia. Pt is from home alone.     PT Comments    Assisted pt OOB to amb to bathroom.  Very unsteady.  Max c/o fatigue.  Limited activity tolerance.  Poor balance.  HIGH FALL RISK.  Assisted with hygiene and steady pt as she took a few steps from toilet to sink.  Near fall.   Pt will need 24/7 assist if D/C to home.  Rec pt NOT amb on her own.  Too unsteady.   Follow Up Recommendations  Home health PT;Supervision/Assistance - 24 hour     Equipment Recommendations       Recommendations for Other Services       Precautions / Restrictions Precautions Precautions: Fall Restrictions Weight Bearing Restrictions: No    Mobility  Bed Mobility Overal bed mobility: Needs Assistance Bed Mobility: Supine to Sit;Sit to Supine     Supine to sit: Min guard Sit to supine: Min guard   General bed mobility comments: close guard for safety. Increased time.   Transfers Overall transfer level: Needs assistance Equipment used: Rolling walker (2 wheeled) Transfers: Sit to/from Stand Sit to Stand: Min assist;Mod assist         General transfer comment: assist to rise, stabilize, control descent. VCs safety, hand placement. Weight shifted posteriorly with initial standing  Ambulation/Gait Ambulation/Gait assistance: Mod assist;Min assist Ambulation Distance (Feet): 20 Feet Assistive device: Rolling walker (2 wheeled) Gait Pattern/deviations: Step-through pattern;Decreased stride length;Trunk flexed Gait velocity: decreased   General Gait Details: Assist to stabilize and maneuver with rW. Unsteady. Fatigues fairly easily.  Grabing to door frame and reaching outside her BOS.  HIGH FALL RISK.   Stairs             Wheelchair Mobility    Modified Rankin (Stroke Patients Only)       Balance                                    Cognition Arousal/Alertness: Awake/alert Behavior During Therapy: WFL for tasks assessed/performed Overall Cognitive Status: Within Functional Limits for tasks assessed                      Exercises      General Comments        Pertinent Vitals/Pain Pain Assessment: No/denies pain    Home Living                      Prior Function            PT Goals (current goals can now be found in the care plan section) Progress towards PT goals: Progressing toward goals    Frequency       PT Plan      Co-evaluation             End of Session Equipment Utilized During Treatment: Gait belt Activity Tolerance: Patient limited by fatigue Patient left: in bed;with call bell/phone within reach;with family/visitor present     Time: 1350-1405 PT Time Calculation (min) (ACUTE ONLY): 15 min  Charges:  $Gait Training: 8-22 mins  G Codes:      Rica Koyanagi  PTA [facility]  Acute  Rehab Pager      [phone]

## 2015-02-17 NOTE — Care Management Note (Signed)
Case Management Note  Patient Details  Name: Chenee Munns MRN: [id number] Date of Birth: [birth year]  Subjective/Objective:     Spoke to patient in rm w/her sister,& niece on phone(Elisabeth c#[phone]) about d/c plans. They all agree to SNF.CSW already following for SNF.               Action/Plan:   Expected Discharge Date:   (unknown)               Expected Discharge Plan:  Skilled Nursing Facility  In-House Referral:  Clinical Social Work  Discharge planning Services  CM Consult  Post Acute Care Choice:  NA Choice offered to:  NA  DME Arranged:  N/A DME Agency:  NA  HH Arranged:  NA, RN, PT, OT, Nurse's Aide [place] Agency:  [place]  Status of Service:  In process, will continue to follow  Medicare Important Message Given:  Yes-third notification given Date Medicare IM Given:    Medicare IM give by:    Date Additional Medicare IM Given:    Additional Medicare Important Message give by:     If discussed at [place] of Stay Meetings, dates discussed:    Additional Comments:  Dessa Phi, RN 02/17/2015, 3:31 PM

## 2015-02-17 NOTE — Progress Notes (Addendum)
[facility] Progress Note   [name] MAU:[id number] DOB: [birth year] DOA: 02/09/2015 PCP: [name], MD  Brief narrative: [name] is a 81 y.o. female with A. fib not on anticoagulation due to memory issues, CML, sweet syndrome, interstitial lung disease who presented to the hospital with constipation for 1 week and lower abdominal pain. CT scan of the abdomen and pelvis was suspicious for rectal stricture. Rectal exam showed rectal impaction and disimpaction was done by the ER doctor, Dr. Darl Householder. Stool was heme positive.   She was noted to have A. fib with RVR and was started on Cardizem infusion. [facility] was consulted by the ER and per request, she was admitted to the hospitalist group. She was started on Laxatives to help prep her for sigmoidoscopy but she was quite agitated overnight and did not take the prep. Despite being given enemas, she had minimal stool output. She underwent a sigmoidoscopy on 7/25. Initial exam prior to this procedure revealed no abnormalities in the rectum. Scope was advanced up to the splenic flexure and no lesions and no stricture was noted.  Did have urinary retention on 7/23-Foley catheter placed - 1000 cc of urine emptied. Started on empiric Rocephin on 7/24  - UA was noted to be positive on admission  On the evening after the sigmoidoscopy, the patient developed a severe congested cough. The family notes that she had a mild cough prior to admission but it was definitely much worse on that evening. A chest x-ray was done on the morning of the 26th and it revealed a patchy sided infiltrate. Treatment for pneumonia was begun with ceftaz edema and vancomycin.    Subjective: Alert- c/o cough but refused cough medication last night. States she is very weak today but also wanting to go home today. No other complaints. I spoke with the niece who is at bedside. She assured me that either she or her sister would take care of her after she leaves the  hospital. Patient has been intermittently refusing medications - have spoken to patient about taking the medications prescribed for her- she appears to understand  Assessment/Plan: Principal Problem:   Atrial fibrillation with RVR -This improved with Cardizem infusion- she was switched to oral diltiazem   -Digoxin was continued - also on Iv Lopressor 2.5 mg QID- switched to oral Lopressor 25 mg BID on 7/27 - Recommended by cardiology on 7/23 to hold off on anticoagulation due to her advanced age, positive Hemoccults and "mental status"- they recommended to start aspirin only  Active Problems: Constipation - Small amount of stool as mentioned above-has not had a BM since prior to the sigmoidoscopy despite receiving daily Miralax -Ordered a dose of Dulcolax yesterday but she refused- will give it today- discussed with patient  -As mentioned above, no evidence of rectal stricture (noted on CT) on sigmoidoscopy  Pneumonia -Left-sided infiltrate-doubtful this is aspiration  - according to the family, symptoms started prior to admission and got worse during hospitalization therefore this may be community-acquired pneumonia - WBC also noted to be rising from her baseline which is usually in the 20s - 7/27switched vand and Fortaz to Levaquin to provide atypical coverage - not hypoxic- cough is the main complaint -Blood cultures from 7/23 and 7/26 are negative -MRSA PCR screen negative - Vanc discontinued     UTI? -UA positive blood culture from 7/23 negative thus far  Urinary retention -Removed Foley and given a voiding trial - she is noted to be voiding appropriately  Hospital induced  delirium  -  probable delirium secondary to a 1st generation antihistamine benadryl [had a lot of itching] and being in the hospital - she has mild forgetfulness at baseline but per her sister, it gets much worse and "she has a different personality" when she is in the hospital - her difficult with cooperation  is new and related to this delirium   Acute neutrophilic dermatosis(Sweet syndrome) / CML - Itching/rash with skin dryness - Sarna lotion PRN - apparently was given Benadryl earlier in the admission which caused increased confusion - may be a candidate for steroids as outpt once infection resolves  Chronic diastolic heart failure, EF this admit 60-65% - Currently stable, no signs of fluid overload - would not recommend Lasix on discharge- she was not on diuretics at home, has poor PO intake and would likely become dehydrated if she was to start diuretics    Disposition: patient did not have a PCP and was not taking medications prior to admission. - I have had a long discussion with the niece today. She will ensure patient goes to see a PCP (we will make the appt with PCP mentioned at top of note) and continue to take the medications we prescribe   Appt with PCP: requested Code Status: full code Family Communication: with nieces - home with home health and with 24 hr care from nieces  Disposition Plan: home when pneumonia symptoms improve DVT prophylaxis: Lovenox Consultants:cardiology, GI Procedures: Flex Sig  Antibiotics: Anti-infectives    Start     Dose/Rate Route Frequency Ordered Stop   02/16/15 1800  levofloxacin (LEVAQUIN) IVPB 750 mg     750 mg 100 mL/hr over 90 Minutes Intravenous Every 48 hours 02/16/15 1747     02/16/15 1400  vancomycin (VANCOCIN) IVPB 750 mg/150 ml premix  Status:  Discontinued     750 mg 150 mL/hr over 60 Minutes Intravenous Every 24 hours 02/15/15 1242 02/16/15 1747   02/15/15 1430  vancomycin (VANCOCIN) 1,250 mg in sodium chloride 0.9 % 250 mL IVPB     1,250 mg 166.7 mL/hr over 90 Minutes Intravenous  Once 02/15/15 1242 02/15/15 1600   02/15/15 1400  cefTAZidime (FORTAZ) 1 g in dextrose 5 % 50 mL IVPB  Status:  Discontinued     1 g 100 mL/hr over 30 Minutes Intravenous Every 12 hours 02/15/15 1222 02/16/15 1747   02/13/15 0630  cefTRIAXone  (ROCEPHIN) 1 g in dextrose 5 % 50 mL IVPB  Status:  Discontinued     1 g 100 mL/hr over 30 Minutes Intravenous Daily 02/13/15 0619 02/15/15 1222   02/09/15 1600  ciprofloxacin (CIPRO) IVPB 400 mg     400 mg 200 mL/hr over 60 Minutes Intravenous  Once 02/09/15 1550 02/09/15 1712   02/09/15 1600  metroNIDAZOLE (FLAGYL) IVPB 500 mg  Status:  Discontinued     500 mg 100 mL/hr over 60 Minutes Intravenous  Once 02/09/15 1550 02/09/15 1804      Objective: Filed Weights   02/15/15 0440 02/16/15 0557 02/17/15 0438  Weight: 56.518 kg (124 lb 9.6 oz) 59.1 kg (130 lb 4.7 oz) 61.5 kg (135 lb 9.3 oz)    Intake/Output Summary (Last 24 hours) at 02/17/15 1308 Last data filed at 02/17/15 0742  Gross per 24 hour  Intake   2550 ml  Output    200 ml  Net   2350 ml     Vitals Filed Vitals:   02/16/15 0557 02/16/15 1300 02/16/15 2108 02/17/15 0438  BP: 116/62  110/54 110/44 122/57  Pulse: 103 112 74 62  Temp: 97.8 F (36.6 C) 97.5 F (36.4 C) 97.5 F (36.4 C) 98.7 F (37.1 C)  TempSrc: Oral Oral Oral Oral  Resp: 20 22 20 20   Height:      Weight: 59.1 kg (130 lb 4.7 oz)   61.5 kg (135 lb 9.3 oz)  SpO2: 91% 95% 94% 94%    Exam:  General:  Pt is alert, not in acute distress  HEENT: No icterus, No thrush, oral mucosa moist  Cardiovascular: regular rate and rhythm, S1/S2 No murmur  Respiratory: clear to auscultation bilaterally   Abdomen: Soft, +Bowel sounds, non tender, non distended, no guarding  MSK: No LE edema, cyanosis or clubbing  Skin: erythematous patch covering most of her body   Data Reviewed: Basic Metabolic Panel:  Recent Labs Lab 02/13/15 0415 02/14/15 0515 02/15/15 1130 02/16/15 0622 02/17/15 0533  NA 134* 133* 132* 132* 132*  K 4.0 4.7 4.6 4.6 4.3  CL 108 108 107 108 106  CO2 18* 19* 18* 19* 19*  GLUCOSE 131* 123* 134* 122* 110*  BUN 15 21* 24* 22* 27*  CREATININE 0.97 0.97 0.94 0.83 0.83  CALCIUM 7.9* 7.7* 7.8* 8.0* 8.0*   Liver Function  Tests:  Recent Labs Lab 02/11/15 0733 02/12/15 0344 02/13/15 0415 02/14/15 0515  AST 31 22 24 29   ALT 14 12* 12* 12*  ALKPHOS 67 63 61 60  BILITOT 0.7 0.5 0.8 0.8  PROT 6.1* 5.4* 5.2* 4.7*  ALBUMIN 3.3* 2.8* 2.5* 2.2*   No results for input(s): LIPASE, AMYLASE in the last 168 hours. No results for input(s): AMMONIA in the last 168 hours. CBC:  Recent Labs Lab 02/11/15 0730 02/12/15 0344 02/12/15 1740 02/13/15 0415 02/14/15 0515 02/15/15 1130 02/16/15 0622  WBC 29.3* 27.4* 35.9* 37.1* 44.5* 49.3* 52.1*  NEUTROABS 25.7* 24.6* 32.7*  --  41.9* 42.3*  --   HGB 12.3 12.3 12.4 12.5 12.0 12.0 11.5*  HCT 40.4 38.6 40.3 39.9 38.1 39.0 37.2  MCV 81.1 78.5 78.1 79.0 77.8* 77.1* 76.7*  PLT 336 357 414* 359 415* 515* 501*   Cardiac Enzymes: No results for input(s): CKTOTAL, CKMB, CKMBINDEX, TROPONINI in the last 168 hours. BNP (last 3 results) No results for input(s): BNP in the last 8760 hours.  ProBNP (last 3 results) No results for input(s): PROBNP in the last 8760 hours.  CBG: No results for input(s): GLUCAP in the last 168 hours.  Recent Results (from the past 240 hour(s))  Clostridium Difficile by PCR (not at [facility])     Status: None   Collection Time: 02/09/15  2:21 PM  Result Value Ref Range Status   C difficile by pcr NEGATIVE NEGATIVE Final  MRSA PCR Screening     Status: None   Collection Time: 02/09/15  5:55 PM  Result Value Ref Range Status   MRSA by PCR NEGATIVE NEGATIVE Final    Comment:        The GeneXpert MRSA Assay (FDA approved for NASAL specimens only), is one component of a comprehensive MRSA colonization surveillance program. It is not intended to diagnose MRSA infection nor to guide or monitor treatment for MRSA infections.   Culture, blood (routine x 2)     Status: None   Collection Time: 02/12/15  5:33 PM  Result Value Ref Range Status   Specimen Description BLOOD RIGHT HAND  Final   Special Requests BOTTLES DRAWN AEROBIC AND ANAEROBIC  10CC  Final   Culture  Final    NO GROWTH 5 DAYS Performed at [facility]    Report Status 02/17/2015 FINAL  Final  Culture, blood (routine x 2)     Status: None   Collection Time: 02/12/15  5:40 PM  Result Value Ref Range Status   Specimen Description LEFT ANTECUBITAL  Final   Special Requests BOTTLES DRAWN AEROBIC AND ANAEROBIC 10CC  Final   Culture   Final    NO GROWTH 5 DAYS Performed at [facility]    Report Status 02/17/2015 FINAL  Final  Culture, Urine     Status: None   Collection Time: 02/12/15  6:45 PM  Result Value Ref Range Status   Specimen Description URINE, CATHETERIZED  Final   Special Requests Immunocompromised  Final   Culture   Final    NO GROWTH 1 DAY Performed at [facility]    Report Status 02/14/2015 FINAL  Final  Culture, blood (routine x 2) Call MD if unable to obtain prior to antibiotics being given     Status: None (Preliminary result)   Collection Time: 02/15/15  1:15 PM  Result Value Ref Range Status   Specimen Description BLOOD LEFT ARM  Final   Special Requests BOTTLES DRAWN AEROBIC AND ANAEROBIC 10CC  Final   Culture   Final    NO GROWTH 2 DAYS Performed at [facility]    Report Status PENDING  Incomplete  Culture, blood (routine x 2) Call MD if unable to obtain prior to antibiotics being given     Status: None (Preliminary result)   Collection Time: 02/15/15  1:20 PM  Result Value Ref Range Status   Specimen Description BLOOD RIGHT ARM  Final   Special Requests BOTTLES DRAWN AEROBIC AND ANAEROBIC 10CC  Final   Culture   Final    NO GROWTH 2 DAYS Performed at [facility]    Report Status PENDING  Incomplete     Studies: No results found.  Scheduled Meds:  Scheduled Meds: . aspirin  81 mg Oral Daily  . bisacodyl  10 mg Rectal Daily  . dextromethorphan  15 mg Oral BID  . digoxin  0.125 mg Oral Daily  . diltiazem  240 mg Oral Daily  . enoxaparin (LOVENOX) injection  40 mg Subcutaneous Q24H   . feeding supplement (ENSURE ENLIVE)  237 mL Oral TID BM  . levofloxacin (LEVAQUIN) IV  750 mg Intravenous Q48H  . LORazepam  0.25 mg Intravenous Once  . metoprolol tartrate  25 mg Oral BID   Continuous Infusions: . sodium chloride 75 mL/hr at 02/17/15 0809    Time spent on care of this patient: 42 min   [place], MD 02/17/2015, 1:08 PM  LOS: 8 days   [facility] Office  [phone] Pager - Text Page per [email or web address] If 7PM-7AM, please contact night-coverage [email or web address]

## 2015-02-17 NOTE — Care Management Note (Signed)
Case Management Note  Patient Details  Name: Kensly Bowmer MRN: [id number] Date of Birth: October 22, 1922  Subjective/Objective:  Will talk to family to confirm d/c plan if home w/HHC.                  Action/Plan:   Expected Discharge Date:   (unknown)               Expected Discharge Plan:  [place]  In-House Referral:  Clinical Social Work  Discharge planning Services  CM Consult  Post Acute Care Choice:  NA Choice offered to:  NA  DME Arranged:  N/A DME Agency:  NA  HH Arranged:  NA, RN, PT, OT, Nurse's Aide [place] Agency:  [place]  Status of Service:  In process, will continue to follow  Medicare Important Message Given:  Yes-third notification given Date Medicare IM Given:    Medicare IM give by:    Date Additional Medicare IM Given:    Additional Medicare Important Message give by:     If discussed at [place] of Stay Meetings, dates discussed:    Additional Comments:  Dessa Phi, RN 02/17/2015, 1:43 PM

## 2015-02-17 NOTE — Care Management Note (Signed)
Case Management Note  Patient Details  Name: [name] MRN: [id number] Date of Birth: 08/03/1922  Subjective/Objective:  I have left vm w/niece Grayland Ormond c# [phone], trying to confirm if patient will stay w/her from yesterday's plan.Noted PT concerned about where patient will stay.CSW notified.Await call back from niece.                  Action/Plan:   Expected Discharge Date:   (unknown)               Expected Discharge Plan:  [place]  In-House Referral:  Clinical Social Work  Discharge planning Services  CM Consult  Post Acute Care Choice:  NA Choice offered to:  NA  DME Arranged:  N/A DME Agency:  NA  HH Arranged:  NA, RN, PT, OT, Nurse's Aide [place] Agency:  [place]  Status of Service:  In process, will continue to follow  Medicare Important Message Given:  Yes-third notification given Date Medicare IM Given:    Medicare IM give by:    Date Additional Medicare IM Given:    Additional Medicare Important Message give by:     If discussed at [place] of Stay Meetings, dates discussed:    Additional Comments:  Dessa Phi, RN 02/17/2015, 2:06 PM

## 2015-02-17 NOTE — Clinical Social Work Placement (Signed)
CSW received consult from [facility], Juliann Pulse that patient & niece are in agreement with SNF. CSW provided SNF bed offers to patient & niece, Stanton Kidney (ph#: [phone]) who states she will talk to the family tonight & let CSW know which SNF they decide to go with. CSW will follow-up in the morning.     Raynaldo Opitz, [facility] Clinical Social Worker cell #: [phone]    CLINICAL SOCIAL WORK PLACEMENT  NOTE  Date:  02/17/2015  Patient Details  Name: [name] MRN: [id number] Date of Birth: 19-Aug-1922  Clinical Social Work is seeking post-discharge placement for this patient at the [place] level of care (*CSW will initial, date and re-position this form in  chart as items are completed):  Yes   Patient/family provided with [facility] Work Department's list of facilities offering this level of care within the geographic area requested by the patient (or if unable, by the patient's family).  Yes   Patient/family informed of their freedom to choose among providers that offer the needed level of care, that participate in Medicare, Medicaid or managed care program needed by the patient, have an available bed and are willing to accept the patient.  Yes   Patient/family informed of [facility]'s ownership interest in [facility] and [facility], as well as of the fact that they are under no obligation to receive care at these facilities.  PASRR submitted to EDS on 02/17/15     PASRR number received on 02/17/15     Existing PASRR number confirmed on       FL2 transmitted to all facilities in geographic area requested by pt/family on 02/17/15     FL2 transmitted to all facilities within larger geographic area on       Patient informed that his/her managed care company has contracts with or will negotiate with certain facilities, including the following:        Yes   Patient/family informed of bed offers received.  Patient  chooses bed at       Physician recommends and patient chooses bed at      Patient to be transferred to   on  .  Patient to be transferred to facility by       Patient family notified on   of transfer.  Name of family member notified:        PHYSICIAN       Additional Comment:    _______________________________________________ Standley Brooking, LCSW 02/17/2015, 4:34 PM

## 2015-02-17 NOTE — Progress Notes (Signed)
Patient Profile: 81 year old female with past medical history of permanent atrial fibrillation, history of diastolic heart failure in the setting of A. fib with RVR, interstitial lung disease, sweet syndrome, CML, orthostatic hypotension, and history of iron deficiency anemia present with constipation and was found in a-fib with RVR. Not an anticoagulant candidate due to advanced age.   Subjective: Pleasantly demented. Family by bedside. No complaints. Resting well.   Objective: Vital signs in last 24 hours: Temp:  [97.5 F (36.4 C)-98.7 F (37.1 C)] 98.7 F (37.1 C) (07/28 0438) Pulse Rate:  [62-112] 62 (07/28 0438) Resp:  [20-22] 20 (07/28 0438) BP: (110-122)/(44-57) 122/57 mmHg (07/28 0438) SpO2:  [94 %-95 %] 94 % (07/28 0438) Weight:  [135 lb 9.3 oz (61.5 kg)] 135 lb 9.3 oz (61.5 kg) (07/28 0438) Last BM Date: 02/13/15  Intake/Output from previous day: 07/27 0701 - 07/28 0700 In: 2000 [P.O.:600; I.V.:1200; IV Piggyback:200] Out: -  Intake/Output this shift:    Medications Current Facility-Administered Medications  Medication Dose Route Frequency Provider Last Rate Last Dose  . 0.9 %  sodium chloride infusion   Intravenous Continuous Nita Sells, MD 75 mL/hr at 02/16/15 1157    . acetaminophen (TYLENOL) tablet 650 mg  650 mg Oral Q6H PRN Dionne Milo, NP   650 mg at 02/13/15 0330  . aspirin chewable tablet 81 mg  81 mg Oral Daily Almyra Deforest, [place]   81 mg at 02/16/15 1001  . bisacodyl (DULCOLAX) suppository 10 mg  10 mg Rectal Daily Debbe Odea, MD   10 mg at 02/16/15 1240  . camphor-menthol (SARNA) lotion   Topical PRN Nita Sells, MD      . chlorpheniramine-HYDROcodone (TUSSIONEX) 10-8 MG/5ML suspension 5 mL  5 mL Oral Once Jeryl Columbia, NP   5 mL at 02/17/15 0323  . digoxin (LANOXIN) tablet 0.125 mg  0.125 mg Oral Daily Nita Sells, MD   0.125 mg at 02/16/15 1001  . diltiazem (CARDIZEM CD) 24 hr capsule 240 mg  240 mg Oral Daily  Nita Sells, MD   240 mg at 02/16/15 1001  . enoxaparin (LOVENOX) injection 40 mg  40 mg Subcutaneous Q24H Nita Sells, MD   40 mg at 02/16/15 2133  . feeding supplement (ENSURE ENLIVE) (ENSURE ENLIVE) liquid 237 mL  237 mL Oral TID BM Debbe Odea, MD   237 mL at 02/16/15 1240  . levofloxacin (LEVAQUIN) IVPB 750 mg  750 mg Intravenous Q48H Debbe Odea, MD   750 mg at 02/16/15 1819  . LORazepam (ATIVAN) injection 0.25 mg  0.25 mg Intravenous Once Jeryl Columbia, NP   0.25 mg at 02/12/15 2300  . metoprolol tartrate (LOPRESSOR) tablet 25 mg  25 mg Oral BID Debbe Odea, MD   25 mg at 02/16/15 1823  . ondansetron (ZOFRAN) injection 4 mg  4 mg Intravenous Q6H PRN Nita Sells, MD      . polyethylene glycol (MIRALAX / GLYCOLAX) packet 17 g  17 g Oral Daily Debbe Odea, MD   17 g at 02/16/15 1240  . polyvinyl alcohol (LIQUIFILM TEARS) 1.4 % ophthalmic solution 1 drop  1 drop Both Eyes PRN Nita Sells, MD        PE: General appearance: alert, cooperative and no distress Neck: no carotid bruit and no JVD Lungs: decreased BS at the left lower base, faint diffuse crackles (ILD) Heart: irregularly irregular rhythm and regular rate Extremities: trace bilateral LEE Pulses: 2+ and symmetric Skin: warm and dry Neurologic: Grossly normal  Lab Results:   Recent Labs  02/15/15 1130 02/16/15 0622  WBC 49.3* 52.1*  HGB 12.0 11.5*  HCT 39.0 37.2  PLT 515* 501*   BMET  Recent Labs  02/15/15 1130 02/16/15 0622 02/17/15 0533  NA 132* 132* 132*  K 4.6 4.6 4.3  CL 107 108 106  CO2 18* 19* 19*  GLUCOSE 134* 122* 110*  BUN 24* 22* 27*  CREATININE 0.94 0.83 0.83  CALCIUM 7.8* 8.0* 8.0*    Studies/Results: 2D echo 02/10/15 Study Conclusions  - Left ventricle: The cavity size was normal. Systolic function was normal. The estimated ejection fraction was in the range of 60% to 65%. Wall motion was normal; there were no regional wall motion  abnormalities. - Mitral valve: Moderately calcified annulus. - Pulmonary arteries: Systolic pressure was mildly increased. PA peak pressure: 39 mm Hg (S).   Assessment/Plan  Principal Problem:   Atrial fibrillation with RVR Active Problems:   Atrial fibrillation   Acute neutrophilic dermatosis   Chronic myelocytic leukemia   Chronic diastolic heart failure   UTI (lower urinary tract infection)   Constipation - functional   Microcytic anemia   Asymptomatic bacteriuria   Chronic atrial fibrillation   Leukocytosis   Rectal stricture   Diarrhea   PNA (pneumonia)   1. Permanent Atrial Fibrillation: rate is improved and stable in the 90s. BP is stable. Continue 240 mg of Cardizem, 25 mg of Lopressor BID and 0.125 mg of digoxin daily. Although CHA2DS2 VASc score is 4, she is not an anticoagulant candidate given advanced age and mental status. Continue 81 mg of ASA daily.    2. Chronic Diastolic CHF: EF 52-08% on recent echo. IV Lasix given yesterday for volume overload. UOP not recorded. Appears stable today.  3. PNA: Left-sided. On antibiotics per IM.   4. Interstitial Lung Disease: followed by Dr. Melvyn Novas   LOS: 8 days    Brittainy M. Ladoris Gene 02/17/2015 7:40 AM  Patient seen, examined. Available data reviewed. Agree with findings, assessment, and plan as outlined by Jolyn Lent, PA-C. Exam reveals clear lung fields and an irregularly irregular heart rhythm. As per note from yesterday, the patient is not a candidate for anticoagulation. Will continue her current rate control medications as she appears stable on these. Please call if any questions arise.  Sherren Mocha, M.D. 02/17/2015 2:49 PM

## 2015-02-18 ENCOUNTER — Inpatient Hospital Stay (HOSPITAL_COMMUNITY): Payer: Medicare Other

## 2015-02-18 DIAGNOSIS — I5031 Acute diastolic (congestive) heart failure: Secondary | ICD-10-CM

## 2015-02-18 DIAGNOSIS — R197 Diarrhea, unspecified: Secondary | ICD-10-CM

## 2015-02-18 DIAGNOSIS — D72829 Elevated white blood cell count, unspecified: Secondary | ICD-10-CM

## 2015-02-18 LAB — BASIC METABOLIC PANEL
Anion gap: 8 (ref 5–15)
BUN: 32 mg/dL — ABNORMAL HIGH (ref 6–20)
CALCIUM: 7.9 mg/dL — AB (ref 8.9–10.3)
CHLORIDE: 104 mmol/L (ref 101–111)
CO2: 21 mmol/L — AB (ref 22–32)
CREATININE: 0.82 mg/dL (ref 0.44–1.00)
GFR calc non Af Amer: >60 mL/min (ref >60)
Glucose, Bld: 96 mg/dL (ref 65–99)
Potassium: 3.9 mmol/L (ref 3.5–5.1)
Sodium: 133 mmol/L — ABNORMAL LOW (ref 135–145)

## 2015-02-18 LAB — CBC
HCT: 32.3 % — ABNORMAL LOW (ref 36.0–46.0)
Hemoglobin: 10.2 g/dL — ABNORMAL LOW (ref 12.0–15.0)
MCH: 24.9 pg — AB (ref 26.0–34.0)
MCHC: 31.6 g/dL (ref 30.0–36.0)
MCV: 79 fL (ref 78.0–100.0)
Platelets: 725 10*3/uL — ABNORMAL HIGH (ref 150–400)
RBC: 4.09 MIL/uL (ref 3.87–5.11)
RDW: 20.2 % — AB (ref 11.5–15.5)
WBC: 69.9 10*3/uL (ref 4.0–10.5)

## 2015-02-18 LAB — BRAIN NATRIURETIC PEPTIDE: B Natriuretic Peptide: 191.5 pg/mL — ABNORMAL HIGH (ref 0.0–100.0)

## 2015-02-18 MED ORDER — FUROSEMIDE 10 MG/ML IJ SOLN
40.0000 mg | Freq: Two times a day (BID) | INTRAMUSCULAR | Status: DC
  Administered 2015-02-18 (×2): 40 mg via INTRAVENOUS
  Filled 2015-02-18 (×3): qty 4

## 2015-02-18 MED ORDER — FOLIC ACID 1 MG PO TABS
1.0000 mg | ORAL_TABLET | Freq: Every day | ORAL | Status: DC
  Administered 2015-02-18: 1 mg via ORAL
  Filled 2015-02-18 (×2): qty 1

## 2015-02-18 NOTE — Clinical Social Work Placement (Signed)
Patient to discharge to [facility] when stable. [facility] aware.   If ready over the weekend, please contact weekend CSW, Rollene Fare (ph#: [phone]) to facilitate discharge.      Raynaldo Opitz, [facility] Clinical Social Worker cell #: [phone]       CLINICAL SOCIAL WORK PLACEMENT  NOTE  Date:  02/18/2015  Patient Details  Name: [name] MRN: [id number] Date of Birth: May 09, 1923  Clinical Social Work is seeking post-discharge placement for this patient at the [place] level of care (*CSW will initial, date and re-position this form in  chart as items are completed):  Yes   Patient/family provided with [place] Work Department's list of facilities offering this level of care within the geographic area requested by the patient (or if unable, by the patient's family).  Yes   Patient/family informed of their freedom to choose among providers that offer the needed level of care, that participate in Medicare, Medicaid or managed care program needed by the patient, have an available bed and are willing to accept the patient.  Yes   Patient/family informed of [facility]'s ownership interest in [facility] and [facility], as well as of the fact that they are under no obligation to receive care at these facilities.  PASRR submitted to EDS on 02/17/15     PASRR number received on 02/17/15     Existing PASRR number confirmed on       FL2 transmitted to all facilities in geographic area requested by pt/family on 02/17/15     FL2 transmitted to all facilities within larger geographic area on       Patient informed that his/her managed care company has contracts with or will negotiate with certain facilities, including the following:        Yes   Patient/family informed of bed offers received.  Patient chooses bed at [facility]     Physician recommends and patient chooses  bed at      Patient to be transferred to [facility] on  .  Patient to be transferred to facility by       Patient family notified on   of transfer.  Name of family member notified:        PHYSICIAN       Additional Comment:    _______________________________________________ Standley Brooking, LCSW 02/18/2015, 12:39 PM

## 2015-02-18 NOTE — Care Management Important Message (Signed)
Important Message  Patient Details  Name: [name] MRN: [id number] Date of Birth: 1923-01-15   Medicare Important Message Given:  Yes-fourth notification given    Camillo Flaming 02/18/2015, 12:20 [place] Message  Patient Details  Name: [name] MRN: [id number] Date of Birth: 03-18-1923   Medicare Important Message Given:  Yes-fourth notification given    Camillo Flaming 02/18/2015, 12:20 PM

## 2015-02-18 NOTE — Consult Note (Signed)
Referral MD  Reason for Referral: Leukocytosis   Chief Complaint  Patient presents with  . Diarrhea  : Patient cannot give any history.  HPI: [name] is a very nice 81 year old white female. I saw her about 4 years ago. I noted in the diagnoses that somehow she has a diagnosis of chronic myeloid leukemia. I have no idea how this diagnosis was made. I doubt she has chronic myeloid leukemia since she's never been treated.  I saw her 4 years ago. She had a bone marrow biopsy done. 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 Again, she was never seen in the office. Patient now has been admitted. She had bad constipation. She had a CT scan done. The CT scan did not show any obvious splenomegaly. She had no adenopathy. Liver looked okay. She had a lot of stool in the colon.  She has had cultures done.  Her white cell count has been going up.. When she was admitted, 3 whites of count is 31.8. Hemoglobin 12.6 and platelet count was 287K.  today, white cell count is up to 70,000. Hemoglobin 10.2 and platelet count is 725,000.Marland Kitchen Her white cell differential shows 77 segs, 7 lymphs, 3 monocytes and 4 eosinophils. There are some myelocytes and metamyelocytes.  We were asked to see her to see if there was a change in her status.  She is incredibly feeble. She has a performance status of ECOG 3-4.  Her daughter was with her.  She has not had issues with infections since before she was admitted.  It is hard to tell what her appetite is like. Her albumin on July 25 was 2.2.  She is not hurting. . She's had no rash. She's not bled.   Past Medical History  Diagnosis Date  . Atrial fib/flutter, transient     Coumadin; status post TEE guided cardioversion 6/12  . Interstitial lung disease 12/17/10    CT scan  of chest, showed old compression fracture T6.  . Microcytic anemia     Iron deficient  . Thrombocytosis   . Hyponatremia   . history of Pneumonia 12/11/10  . Chronic diastolic heart failure      Echo 5/12: EF 60-65%, mild MR, mild BAE  . Chronic myelocytic leukemia     Dr. Ralene Ok  . Acute neutrophilic dermatosis     Sweet syndrome-prednisone therapy; [facility]  :  Past Surgical History  Procedure Laterality Date  . Hysterectomy      PARTIAL   . Flexible sigmoidoscopy Left 02/14/2015    Procedure: FLEXIBLE SIGMOIDOSCOPY;  Surgeon: Laurence Spates, MD;  Location: [facility];  Service: Endoscopy;  Laterality: Left;  :   Current facility-administered medications:  .  acetaminophen (TYLENOL) tablet 650 mg, 650 mg, Oral, Q6H PRN, Dionne Milo, NP, 650 mg at 02/13/15 0330 .  aspirin chewable tablet 81 mg, 81 mg, Oral, Daily, [place], [place], 81 mg at 02/18/15 1029 .  bisacodyl (DULCOLAX) suppository 10 mg, 10 mg, Rectal, Daily, Debbe Odea, MD, 10 mg at 02/17/15 1240 .  camphor-menthol (SARNA) lotion, , Topical, PRN, Nita Sells, MD .  dextromethorphan (DELSYM) 30 MG/5ML liquid 15 mg, 15 mg, Oral, BID, Debbe Odea, MD, 15 mg at 02/18/15 1035 .  digoxin (LANOXIN) tablet 0.125 mg, 0.125 mg, Oral, Daily, Nita Sells, MD, 0.125 mg at 02/18/15 1029 .  diltiazem (CARDIZEM CD) 24 hr capsule 240 mg, 240 mg, Oral, Daily, Nita Sells, MD, 240 mg at 02/18/15 1014 .  enoxaparin (LOVENOX) injection 40  mg, 40 mg, Subcutaneous, Q24H, Nita Sells, MD, 40 mg at 02/17/15 2023 .  feeding supplement (ENSURE ENLIVE) (ENSURE ENLIVE) liquid 237 mL, 237 mL, Oral, TID BM, Saima Rizwan, MD, 237 mL at 02/18/15 1522 .  furosemide (LASIX) injection 40 mg, 40 mg, Intravenous, BID, Debbe Odea, MD, 40 mg at 02/18/15 1717 .  levofloxacin (LEVAQUIN) IVPB 750 mg, 750 mg, Intravenous, Q48H, Saima Rizwan, MD, 750 mg at 02/18/15 1713 .  LORazepam (ATIVAN) injection 0.25 mg, 0.25 mg, Intravenous, Once, Jeryl Columbia, NP, 0.25 mg at 02/12/15 2300 .  metoprolol tartrate (LOPRESSOR) tablet 25 mg, 25 mg, Oral, BID, Debbe Odea, MD, 25 mg at 02/18/15 1013 .   ondansetron (ZOFRAN) injection 4 mg, 4 mg, Intravenous, Q6H PRN, Nita Sells, MD .  polyvinyl alcohol (LIQUIFILM TEARS) 1.4 % ophthalmic solution 1 drop, 1 drop, Both Eyes, PRN, Nita Sells, MD:  . aspirin  81 mg Oral Daily  . bisacodyl  10 mg Rectal Daily  . dextromethorphan  15 mg Oral BID  . digoxin  0.125 mg Oral Daily  . diltiazem  240 mg Oral Daily  . enoxaparin (LOVENOX) injection  40 mg Subcutaneous Q24H  . feeding supplement (ENSURE ENLIVE)  237 mL Oral TID BM  . furosemide  40 mg Intravenous BID  . levofloxacin (LEVAQUIN) IV  750 mg Intravenous Q48H  . LORazepam  0.25 mg Intravenous Once  . metoprolol tartrate  25 mg Oral BID  :  No Known Allergies:  Family History  Problem Relation Age of Onset  . Heart disease Mother 26  . Heart attack Father 18    died of heart attack.  . Leukemia    . Cancer - Lung    :  History   Social History  . Marital Status: Single    Spouse Name: N/A  . Number of Children: 0  . Years of Education: N/A   Occupational History  . RETIRED     Social History Main Topics  . Smoking status: Never Smoker   . Smokeless tobacco: Never Used  . Alcohol Use: No  . Drug Use: No  . Sexual Activity: Not on file   Other Topics Concern  . Not on file   Social History Narrative   Prior used to be music and Conservation officer, nature   Independent of ADLs and IADLs   Last control 2014-has poor vision so stopped   Next of kin is his niece     :  Pertinent items are noted in HPI.  Exam: Patient Vitals for the past 24 hrs:  BP Temp Temp src Pulse Resp SpO2 Weight  02/18/15 1654 (!) 103/47 mmHg 97.6 F (36.4 C) Axillary 68 (!) 22 92 % -  02/18/15 1029 - - - 86 - - -  02/18/15 0518 (!) 114/45 mmHg 97.5 F (36.4 C) Axillary 80 20 93 % 136 lb 14.5 oz (62.1 kg)  02/18/15 0058 138/62 mmHg 97.6 F (36.4 C) Axillary 96 20 94 % -  02/17/15 2202 - - - - - 93 % -  02/17/15 2132 (!) 128/46 mmHg 97.8 F (36.6 C) Oral (!) 115 (!) 22 95 %  -    elderly, frail white female. Head and neck exam shows no scleral icterus. There is no oral lesions. She has no adenopathy in the neck. Lungs are decreased at the bases. Cardiac exam regular rate and rhythm with an occasional extra beat. It sounds like she has some atrial fibrillation. Abdomen is soft. She has decreased  bowel sounds. There is no palpable liver or spleen tip. Skin exam shows no rashes. Neurological exam is nonfocal. Extremities shows some edema in her legs.   Recent Labs  02/16/15 0622 02/18/15 0835  WBC 52.1* 69.9*  HGB 11.5* 10.2*  HCT 37.2 32.3*  PLT 501* 725*    Recent Labs  02/17/15 0533 02/18/15 0835  NA 132* 133*  K 4.3 3.9  CL 106 104  CO2 19* 21*  GLUCOSE 110* 96  BUN 27* 32*  CREATININE 0.83 0.82  CALCIUM 8.0* 7.9*    Blood smear review:  normochromic and normocytic population of red blood cells. She has no nucleated red cells. I see no teardrop cells. There is no rouleau formation. I see no target cells. She has no inclusion bodies. White cells are increased in number. She has some immature myeloid cells. I do not see any blasts. She has a few hypersegmented polys. Platelets are increased in number. She has several large platelets that are well granulated.   Pathology: None     Assessment and Plan:  [name] is an 81 year old white female. Again, she was seen 4 years ago. She had a bone marrow done back then which seemed to show this hybrid type of bone marrow disorder. She never has been on therapy.  I think the issue with her is her performance status. She is very frail. She really is not even a candidate for a bone marrow test. I don't think putting her through bone marrow test is going to change the outcome.  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 I think with what is going on with her right now, her bone marrow is just "stressed" and she just is making more white cells and platelets.  I  will do some genetic testing on her blood. We can do this now and not have to resort to a bone marrow biopsy.  She is not a candidate for any intervention outside of possibly [place].  She could certainly have chronic myelomonocytic leukemia. Again, the overweight in no this would be with a bone marrow test. Again, I would not recommend this given her overall poor performance status.  Her daughter was with [place]. I answered all of her questions.  She apparently is supposed be discharged over the weekend.   Hopefully, I can get the blood work I need before she is discharged. Once she is discharged, it will be very difficult to get the labs that we need.  I appreciated the opportunity to have seen her. Again, it has been 4 years since I last saw her.

## 2015-02-18 NOTE — Progress Notes (Signed)
SLP Cancellation Note  Patient Details Name: [name] MRN: [id number] DOB: 07-05-1923   Cancelled treatment:       Reason Eval/Treat Not Completed: Other (comment) (pt currently sound asleep, deep respirations noted with open mouth posture, will reattempt SLP tx at a later time)   Luanna Salk, [facility] [place] [phone]

## 2015-02-18 NOTE — Clinical Social Work Placement (Signed)
   CLINICAL SOCIAL WORK PLACEMENT  NOTE  Date:  02/18/2015  Patient Details  Name: [name] MRN: [id number] Date of Birth: Feb 03, 1923  Clinical Social Work is seeking post-discharge placement for this patient at the [place] level of care (*CSW will initial, date and re-position this form in  chart as items are completed):  Yes   Patient/family provided with [place] Work Department's list of facilities offering this level of care within the geographic area requested by the patient (or if unable, by the patient's family).  Yes   Patient/family informed of their freedom to choose among providers that offer the needed level of care, that participate in Medicare, Medicaid or managed care program needed by the patient, have an available bed and are willing to accept the patient.  Yes   Patient/family informed of [facility]'s ownership interest in [facility] and [facility], as well as of the fact that they are under no obligation to receive care at these facilities.  PASRR submitted to EDS on 02/17/15     PASRR number received on 02/17/15     Existing PASRR number confirmed on       FL2 transmitted to all facilities in geographic area requested by pt/family on 02/17/15     FL2 transmitted to all facilities within larger geographic area on       Patient informed that his/her managed care company has contracts with or will negotiate with certain facilities, including the following:        Yes   Patient/family informed of bed offers received.  Patient chooses bed at [facility]     Physician recommends and patient chooses bed at      Patient to be transferred to [facility] on  .  Patient to be transferred to facility by       Patient family notified on   of transfer.  Name of family member notified:        PHYSICIAN       Additional Comment:    _______________________________________________ Standley Brooking,  LCSW 02/18/2015, 12:39 PM

## 2015-02-18 NOTE — Progress Notes (Addendum)
[facility] Progress Note   [name] ZOX:[id number] DOB: [birth year] DOA: 02/09/2015 PCP: [name], MD  Brief narrative: [name] is a 81 y.o. female with A. fib not on anticoagulation due to memory issues, CML, sweet syndrome, interstitial lung disease who presented to the hospital with constipation for 1 week and lower abdominal pain. CT scan of the abdomen and pelvis was suspicious for rectal stricture. Rectal exam showed rectal impaction and disimpaction was done by the ER doctor, Dr. Darl Householder. Stool was heme positive.   She was noted to have A. fib with RVR and was started on Cardizem infusion. [facility] was consulted by the ER and per request, she was admitted to the hospitalist group. She was started on Laxatives to help prep her for sigmoidoscopy but she was quite agitated overnight and did not take the prep. Despite being given enemas, she had minimal stool output. She underwent a sigmoidoscopy on 7/25. Initial exam prior to this procedure revealed no abnormalities in the rectum. Scope was advanced up to the splenic flexure and no lesions and no stricture was noted.  Did have urinary retention on 7/23-Foley catheter placed - 1000 cc of urine emptied. Started on empiric Rocephin on 7/24  - UA was noted to be positive on admission  On the evening after the sigmoidoscopy, the patient developed a severe congested cough. The family notes that she had a mild cough prior to admission but it was definitely much worse on that evening. A chest x-ray was done on the morning of the 26th and it revealed a patchy sided infiltrate. Treatment for pneumonia was begun with ceftaz edema and vancomycin.    Subjective: Became suddenly short of breath last night-given IV Lasix, dyspnea improved. Per niece, the patient's cough is getting better each day.   Assessment/Plan: Principal Problem:   Atrial fibrillation with RVR -This improved with Cardizem infusion- she was switched to oral diltiazem    -Digoxin was continued - also on Iv Lopressor 2.5 mg QID- switched to oral Lopressor 25 mg BID on 7/27 - Recommended by cardiology on 7/23 to hold off on anticoagulation due to her advanced age, positive Hemoccults and "mental status"- they recommended to start aspirin only  Active Problems: Constipation - Small amount of stool as mentioned above-has not had a BM since prior to the sigmoidoscopy despite receiving daily Miralax - had 2 BMs after dulcolax on 7/28 -As mentioned above, no evidence of rectal stricture (noted on CT) on sigmoidoscopy  Pneumonia -Left-sided infiltrate-doubtful this is aspiration  - according to the family, symptoms started prior to admission and got worse during hospitalization therefore this may be community-acquired pneumonia - WBC also noted to be rising from her baseline which is usually in the 20s - 7/27switched vand and Fortaz to Levaquin to provide atypical coverage-  - not hypoxic- cough is the main complaint -Blood cultures from 7/23 and 7/26 are negative -MRSA PCR screen negative - Vanc discontinued  - although WBC count worse, symptoms of cough better- CXR today reveals b/l edema and no focal infiltrate - cont to diurese with Lasix- see below- cont Levaquin  Acute pulm edema/ acute diastolic CHF - Lasix being given daily since 7/27- will increase to BID today - follow daily weights- staff letting her urinate in the bed although strict I and O ordered    UTI? -UA positive - urine culture from 7/23 negative    Urinary retention -Removed Foley and gave a voiding trial - she is noted to be voiding  appropriately  Hospital induced delirium  -  probable delirium secondary to a 1st generation antihistamine benadryl [had a lot of itching] and being in the hospital - she has mild forgetfulness at baseline but per her sister, it gets much worse and "she has a different personality" when she is in the hospital - her difficulty with cooperation is new and  likely related to this delirium   Acute neutrophilic dermatosis(Sweet syndrome) / CML - Itching/rash with skin dryness - Sarna lotion PRN - apparently was given Benadryl earlier in the admission which caused increased confusion - may be a candidate for steroids as outpt once infection resolves - as WBC count continues to rise, have consulted Oncology today- she refused to see doctors as outpt and Dr Marin Olp last saw her (only saw her once) on 2012   Disposition: patient did not have a PCP and was not taking medications prior to admission. - I have had a long discussion with the niece.Marland Kitchen She will ensure patient goes to see a PCP (we will make the appt with PCP mentioned at top of note) and continue to take the medications we prescribe. For now she will be going to a SNF.    Appt with PCP: requested Code Status: full code Family Communication: with nieces - home with home health and with 24 hr care from nieces  Disposition Plan: SNF hopefully on sunday DVT prophylaxis: Lovenox Consultants:cardiology, GI Procedures: Flex Sig  Antibiotics: Anti-infectives    Start     Dose/Rate Route Frequency Ordered Stop   02/16/15 1800  levofloxacin (LEVAQUIN) IVPB 750 mg     750 mg 100 mL/hr over 90 Minutes Intravenous Every 48 hours 02/16/15 1747     02/16/15 1400  vancomycin (VANCOCIN) IVPB 750 mg/150 ml premix  Status:  Discontinued     750 mg 150 mL/hr over 60 Minutes Intravenous Every 24 hours 02/15/15 1242 02/16/15 1747   02/15/15 1430  vancomycin (VANCOCIN) 1,250 mg in sodium chloride 0.9 % 250 mL IVPB     1,250 mg 166.7 mL/hr over 90 Minutes Intravenous  Once 02/15/15 1242 02/15/15 1600   02/15/15 1400  cefTAZidime (FORTAZ) 1 g in dextrose 5 % 50 mL IVPB  Status:  Discontinued     1 g 100 mL/hr over 30 Minutes Intravenous Every 12 hours 02/15/15 1222 02/16/15 1747   02/13/15 0630  cefTRIAXone (ROCEPHIN) 1 g in dextrose 5 % 50 mL IVPB  Status:  Discontinued     1 g 100 mL/hr over 30 Minutes  Intravenous Daily 02/13/15 0619 02/15/15 1222   02/09/15 1600  ciprofloxacin (CIPRO) IVPB 400 mg     400 mg 200 mL/hr over 60 Minutes Intravenous  Once 02/09/15 1550 02/09/15 1712   02/09/15 1600  metroNIDAZOLE (FLAGYL) IVPB 500 mg  Status:  Discontinued     50 0 mg 100 mL/hr over 60 Minutes Intravenous  Once 02/09/15 1550 02/09/15 1804      Objective: Filed Weights   02/16/15 0557 02/17/15 0438 02/18/15 0518  Weight: 59.1 kg (130 lb 4.7 oz) 61.5 kg (135 lb 9.3 oz) 62.1 kg (136 lb 14.5 oz)    Intake/Output Summary (Last 24 hours) at 02/18/15 1128 Last data filed at 02/18/15 1006  Gross per 24 hour  Intake 1752.17 ml  Output      0 ml  Net 1752.17 ml     Vitals Filed Vitals:   02/17/15 2202 02/18/15 0058 02/18/15 0518 02/18/15 1029  BP:  138/62 114/45   Pulse:  96 80 86  Temp:  97.6 F (36.4 C) 97.5 F (36.4 C)   TempSrc:  Axillary Axillary   Resp:  20 20   Height:      Weight:   62.1 kg (136 lb 14.5 oz)   SpO2: 93% 94% 93%     Exam:  General:  Pt is alert, not in acute distress  HEENT: No icterus, No thrush, oral mucosa moist  Cardiovascular: regular rate and rhythm, S1/S2 No murmur  Respiratory: clear to auscultation bilaterally - poor air entry- pulse ox 93% on room air  Abdomen: Soft, +Bowel sounds, non tender, non distended, no guarding  MSK: No LE edema, cyanosis or clubbing  Skin: erythematous patch covering most of her body   Data Reviewed: Basic Metabolic Panel:  Recent Labs Lab 02/14/15 0515 02/15/15 1130 02/16/15 0622 02/17/15 0533 02/18/15 0835  NA 133* 132* 132* 132* 133*  K 4.7 4.6 4.6 4.3 3.9  CL 108 107 108 106 104  CO2 19* 18* 19* 19* 21*  GLUCOSE 123* 134* 122* 110* 96  BUN 21* 24* 22* 27* 32*  CREATININE 0.97 0.94 0.83 0.83 0.82  CALCIUM 7.7* 7.8* 8.0* 8.0* 7.9*   Liver Function Tests:  Recent Labs Lab 02/12/15 0344 02/13/15 0415 02/14/15 0515  AST 22 24 29   ALT 12* 12* 12*  ALKPHOS 63 61 60  BILITOT 0.5 0.8 0.8   PROT 5.4* 5.2* 4.7*  ALBUMIN 2.8* 2.5* 2.2*   No results for input(s): LIPASE, AMYLASE in the last 168 hours. No results for input(s): AMMONIA in the last 168 hours. CBC:  Recent Labs Lab 02/12/15 0344 02/12/15 1740 02/13/15 0415 02/14/15 0515 02/15/15 1130 02/16/15 0622 02/18/15 0835  WBC 27.4* 35.9* 37.1* 44.5* 49.3* 52.1* 69.9*  NEUTROABS 24.6* 32.7*  --  41.9* 42.3*  --   --   HGB 12.3 12.4 12.5 12.0 12.0 11.5* 10.2*  HCT 38.6 40.3 39.9 38.1 39.0 37.2 32.3*  MCV 78.5 78.1 79.0 77.8* 77.1* 76.7* 79.0  PLT 357 414* 359 415* 515* 501* 725*   Cardiac Enzymes: No results for input(s): CKTOTAL, CKMB, CKMBINDEX, TROPONINI in the last 168 hours. BNP (last 3 results) No results for input(s): BNP in the last 8760 hours.  ProBNP (last 3 results) No results for input(s): PROBNP in the last 8760 hours.  CBG: No results for input(s): GLUCAP in the last 168 hours.  Recent Results (from the past 240 hour(s))  Clostridium Difficile by PCR (not at [facility])     Status: None   Collection Time: 02/09/15  2:21 PM  Result Value Ref Range Status   C difficile by pcr NEGATIVE NEGATIVE Final  MRSA PCR Screening     Status: None   Collection Time: 02/09/15  5:55 PM  Result Value Ref Range Status   MRSA by PCR NEGATIVE NEGATIVE Final    Comment:        The GeneXpert MRSA Assay (FDA approved for NASAL specimens only), is one component of a comprehensive MRSA colonization surveillance program. It is not intended to diagnose MRSA infection nor to guide or monitor treatment for MRSA infections.   Culture, blood (routine x 2)     Status: None   Collection Time: 02/12/15  5:33 PM  Result Value Ref Range Status   Specimen Description BLOOD RIGHT HAND  Final   Special Requests BOTTLES DRAWN AEROBIC AND ANAEROBIC 10CC  Final   Culture   Final    NO GROWTH 5 DAYS Performed at [facility]  Report Status 02/17/2015 FINAL  Final  Culture, blood (routine x 2)     Status: None    Collection Time: 02/12/15  5:40 PM  Result Value Ref Range Status   Specimen Description LEFT ANTECUBITAL  Final   Special Requests BOTTLES DRAWN AEROBIC AND ANAEROBIC 10CC  Final   Culture   Final    NO GROWTH 5 DAYS Performed at [facility]    Report Status 02/17/2015 FINAL  Final  Culture, Urine     Status: None   Collection Time: 02/12/15  6:45 PM  Result Value Ref Range Status   Specimen Description URINE, CATHETERIZED  Final   Special Requests Immunocompromised  Final   Culture   Final    NO GROWTH 1 DAY Performed at [facility]    Report Status 02/14/2015 FINAL  Final  Culture, blood (routine x 2) Call MD if unable to obtain prior to antibiotics being given     Status: None (Preliminary result)   Collection Time: 02/15/15  1:15 PM  Result Value Ref Range Status   Specimen Description BLOOD LEFT ARM  Final   Special Requests BOTTLES DRAWN AEROBIC AND ANAEROBIC 10CC  Final   Culture   Final    NO GROWTH 2 DAYS Performed at [facility]    Report Status PENDING  Incomplete  Culture, blood (routine x 2) Call MD if unable to obtain prior to antibiotics being given     Status: None (Preliminary result)   Collection Time: 02/15/15  1:20 PM  Result Value Ref Range Status   Specimen Description BLOOD RIGHT ARM  Final   Special Requests BOTTLES DRAWN AEROBIC AND ANAEROBIC 10CC  Final   Culture   Final    NO GROWTH 2 DAYS Performed at [facility]    Report Status PENDING  Incomplete     Studies: Dg Chest Port 1 View  02/18/2015   CLINICAL DATA:  Pneumonia. History of interstitial lung disease. History of leukemia.  EXAM: PORTABLE CHEST - 1 VIEW  COMPARISON:  Chest x-rays dated 02/15/2015 and 02/12/2015  FINDINGS: Mild cardiomegaly is unchanged. Again noted is mild central pulmonary vascular congestion and bilateral interstitial thickening, perhaps slightly worsened on the left compared to the previous studies. Suspect small left pleural effusion  and left basilar atelectasis.  IMPRESSION: 1. Cardiomegaly with central pulmonary vascular congestion and bilateral interstitial edema which appears slightly worsened on the left when compared to previous exams. Suspect a mild volume overload/CHF superimposed on chronic interstitial fibrosis, and slightly worsened fluid status in the short-term interval. 2. Probable small left pleural effusion and left basilar atelectasis.   Electronically Signed   By: Franki Cabot M.D.   On: 02/18/2015 10:33    Scheduled Meds:  Scheduled Meds: . aspirin  81 mg Oral Daily  . bisacodyl  10 mg Rectal Daily  . dextromethorphan  15 mg Oral BID  . digoxin  0.125 mg Oral Daily  . diltiazem  240 mg Oral Daily  . enoxaparin (LOVENOX) injection  40 mg Subcutaneous Q24H  . feeding supplement (ENSURE ENLIVE)  237 mL Oral TID BM  . furosemide  40 mg Intravenous BID  . levofloxacin (LEVAQUIN) IV  750 mg Intravenous Q48H  . LORazepam  0.25 mg Intravenous Once  . metoprolol tartrate  25 mg Oral BID   Continuous Infusions: . sodium chloride 10 mL/hr at 02/17/15 2102    Time spent on care of this patient: 44 min   [place], MD  02/18/2015, 11:28 AM  LOS: 9 days   [facility] Office  [phone] Pager - Text Page per [email or web address] If 7PM-7AM, please contact night-coverage [email or web address]

## 2015-02-18 NOTE — Progress Notes (Signed)
Physical Therapy Treatment Patient Details Name: [name] MRN: [id number] DOB: 06/30/1923 Today's Date: 02/18/2015    History of Present Illness 81 yo female admittee with A fib with RVR. hx of A fib, A flutter,, HF, interstitial lung disease, orthostatic hypotension, CML, anemia. Pt is from home alone.     PT Comments    Improvement in cognition noted today compared to yesterday. Pt still somewhat confused at times. Pt participated well with session on today. D/C plan has been updated/changed to ST rehab at [facility] for continued rehab  Follow Up Recommendations  SNF     Equipment Recommendations       Recommendations for Other Services       Precautions / Restrictions Precautions Precautions: Fall Restrictions Weight Bearing Restrictions: No    Mobility  Bed Mobility Overal bed mobility: Needs Assistance Bed Mobility: Supine to Sit;Sit to Supine     Supine to sit: Mod assist Sit to supine: Min guard   General bed mobility comments: Assist for trunk to upright and for scoot to EOB. Increased time.   Transfers Overall transfer level: Needs assistance   Transfers: Sit to/from Stand;Stand Pivot Transfers Sit to Stand: Min assist Stand pivot transfers: Min assist       General transfer comment: assist to rise, stabilize, control descent. stand pivot x 2, bed<>bsc with RW.   Ambulation/Gait             General Gait Details: NT-pt too fatiguedm, requested back to bed   Stairs            Wheelchair Mobility    Modified Rankin (Stroke Patients Only)       Balance           Standing balance support: Bilateral upper extremity supported;During functional activity Standing balance-Leahy Scale: Poor                      Cognition Arousal/Alertness: Awake/alert Behavior During Therapy: WFL for tasks assessed/performed Overall Cognitive Status: Within Functional Limits for tasks assessed                      Exercises       General Comments        Pertinent Vitals/Pain Pain Assessment: No/denies pain    Home Living                      Prior Function            PT Goals (current goals can now be found in the care plan section) Progress towards PT goals: Progressing toward goals    Frequency  Min 3X/week    PT Plan Discharge plan needs to be updated    Co-evaluation             End of Session Equipment Utilized During Treatment: Gait belt Activity Tolerance: Patient limited by fatigue Patient left: in bed;with call bell/phone within reach;with bed alarm set;with family/visitor present     Time: 1751-0258 PT Time Calculation (min) (ACUTE ONLY): 23 min  Charges:  $Therapeutic Activity: 23-37 mins                    G Codes:      Weston Anna, MPT Pager: [phone]

## 2015-02-19 ENCOUNTER — Inpatient Hospital Stay (HOSPITAL_COMMUNITY): Payer: Medicare Other

## 2015-02-19 DIAGNOSIS — D469 Myelodysplastic syndrome, unspecified: Secondary | ICD-10-CM

## 2015-02-19 DIAGNOSIS — D62 Acute posthemorrhagic anemia: Secondary | ICD-10-CM

## 2015-02-19 DIAGNOSIS — K922 Gastrointestinal hemorrhage, unspecified: Secondary | ICD-10-CM

## 2015-02-19 LAB — CBC
HCT: 23.3 % — ABNORMAL LOW (ref 36.0–46.0)
HCT: 26.9 % — ABNORMAL LOW (ref 36.0–46.0)
HEMATOCRIT: 28.1 % — AB (ref 36.0–46.0)
HEMOGLOBIN: 9.4 g/dL — AB (ref 12.0–15.0)
Hemoglobin: 7.5 g/dL — ABNORMAL LOW (ref 12.0–15.0)
Hemoglobin: 9.1 g/dL — ABNORMAL LOW (ref 12.0–15.0)
MCH: 25.3 pg — AB (ref 26.0–34.0)
MCH: 27.3 pg (ref 26.0–34.0)
MCH: 27.3 pg (ref 26.0–34.0)
MCHC: 32.2 g/dL (ref 30.0–36.0)
MCHC: 33.5 g/dL (ref 30.0–36.0)
MCHC: 33.8 g/dL (ref 30.0–36.0)
MCV: 78.7 fL (ref 78.0–100.0)
MCV: 81.7 fL (ref 78.0–100.0)
MCV: 80.8 fL (ref 78.0–100.0)
PLATELETS: 483 10*3/uL — AB (ref 150–400)
Platelets: 626 10*3/uL — ABNORMAL HIGH (ref 150–400)
Platelets: 465 10*3/uL — ABNORMAL HIGH (ref 150–400)
RBC: 2.96 MIL/uL — AB (ref 3.87–5.11)
RBC: 3.33 MIL/uL — ABNORMAL LOW (ref 3.87–5.11)
RBC: 3.44 MIL/uL — AB (ref 3.87–5.11)
RDW: 17 % — AB (ref 11.5–15.5)
RDW: 17.2 % — ABNORMAL HIGH (ref 11.5–15.5)
RDW: 20.5 % — ABNORMAL HIGH (ref 11.5–15.5)
WBC: 66.4 10*3/uL (ref 4.0–10.5)
WBC: 79.2 10*3/uL (ref 4.0–10.5)
WBC: 82.2 10*3/uL (ref 4.0–10.5)

## 2015-02-19 LAB — BASIC METABOLIC PANEL
Anion gap: 7 (ref 5–15)
BUN: 37 mg/dL — AB (ref 6–20)
CHLORIDE: 103 mmol/L (ref 101–111)
CO2: 26 mmol/L (ref 22–32)
CREATININE: 0.86 mg/dL (ref 0.44–1.00)
Calcium: 7.8 mg/dL — ABNORMAL LOW (ref 8.9–10.3)
GFR calc Af Amer: >60 mL/min (ref >60)
GFR calc non Af Amer: 58 mL/min — ABNORMAL LOW (ref >60)
GLUCOSE: 96 mg/dL (ref 65–99)
POTASSIUM: 3.9 mmol/L (ref 3.5–5.1)
Sodium: 136 mmol/L (ref 135–145)

## 2015-02-19 LAB — IRON AND TIBC
Iron: 34 ug/dL (ref 28–170)
SATURATION RATIOS: 17 % (ref 10.4–31.8)
TIBC: 203 ug/dL — AB (ref 250–450)
UIBC: 169 ug/dL

## 2015-02-19 LAB — HEMOGLOBIN AND HEMATOCRIT, BLOOD
HEMATOCRIT: 21.6 % — AB (ref 36.0–46.0)
HEMOGLOBIN: 6.8 g/dL — AB (ref 12.0–15.0)

## 2015-02-19 LAB — RETICULOCYTES
RBC.: 2.84 MIL/uL — ABNORMAL LOW (ref 3.87–5.11)
RETIC CT PCT: 4.6 % — AB (ref 0.4–3.1)
Retic Count, Absolute: 130.6 10*3/uL (ref 19.0–186.0)

## 2015-02-19 LAB — FERRITIN: Ferritin: 820 ng/mL — ABNORMAL HIGH (ref 11–307)

## 2015-02-19 LAB — PREPARE RBC (CROSSMATCH)

## 2015-02-19 LAB — DIGOXIN LEVEL: Digoxin Level: 0.8 ng/mL (ref 0.8–2.0)

## 2015-02-19 LAB — ABO/RH: ABO/RH(D): O POS

## 2015-02-19 MED ORDER — DILTIAZEM HCL 30 MG PO TABS: 30.0000 mg | ORAL_TABLET | Freq: Four times a day (QID) | ORAL | Status: DC

## 2015-02-19 MED ORDER — SODIUM CHLORIDE 0.9 % IV SOLN
Freq: Once | INTRAVENOUS | Status: AC
  Administered 2015-02-21: 13:00:00 via INTRAVENOUS

## 2015-02-19 MED ORDER — PANTOPRAZOLE SODIUM 40 MG IV SOLR
40.0000 mg | Freq: Two times a day (BID) | INTRAVENOUS | Status: DC
  Administered 2015-02-19 – 2015-02-22 (×8): 40 mg via INTRAVENOUS
  Filled 2015-02-19 (×9): qty 40

## 2015-02-19 MED ORDER — SODIUM CHLORIDE 0.9 % IV SOLN
Freq: Once | INTRAVENOUS | Status: AC
  Administered 2015-02-19: 12:00:00 via INTRAVENOUS

## 2015-02-19 MED ORDER — LIP MEDEX EX OINT
TOPICAL_OINTMENT | CUTANEOUS | Status: AC
  Filled 2015-02-19: qty 7

## 2015-02-19 MED ORDER — LIP MEDEX EX OINT
TOPICAL_OINTMENT | CUTANEOUS | Status: DC | PRN
  Administered 2015-02-20: 03:00:00 via TOPICAL

## 2015-02-19 NOTE — Progress Notes (Addendum)
[facility] Progress Note   [name] HUT:[id number] DOB: Jun 02, 1923 DOA: 02/09/2015 PCP: [name], MD  Brief narrative: [name] is a 81 y.o. female with A. fib not on anticoagulation due to memory issues, CML, sweet syndrome, interstitial lung disease who presented to the hospital with constipation for 1 week and lower abdominal pain. CT scan of the abdomen and pelvis was suspicious for rectal stricture. Rectal exam showed rectal impaction and disimpaction was done by the ER doctor, Dr. Darl Householder. Stool was heme positive.   She was noted to have A. fib with RVR and was started on Cardizem infusion. [facility] was consulted by the ER and per request, she was admitted to the hospitalist group. She was started on Laxatives to help prep her for sigmoidoscopy but she was quite agitated overnight and did not take the prep. Despite being given enemas, she had minimal stool output. She underwent a sigmoidoscopy on 7/25. Initial exam prior to this procedure revealed no abnormalities in the rectum. Scope was advanced up to the splenic flexure and no lesions and no stricture was noted.  Did have urinary retention on 7/23-Foley catheter placed - 1000 cc of urine emptied. Started on empiric Rocephin on 7/24  - UA was noted to be positive on admission  On the evening after the sigmoidoscopy, the patient developed a severe congested cough. The family notes that she had a mild cough prior to admission but it was definitely much worse on that evening. A chest x-ray was done on the morning of the 26th and it revealed a patchy sided infiltrate. Treatment for pneumonia was begun with ceftaz edema and vancomycin.    Subjective: Quite lethargic today. Poorly communicative   Assessment/Plan: Principal Problem:   Atrial fibrillation with RVR -This improved with Cardizem infusion- she was switched to oral diltiazem   -Digoxin was continued - also on Iv Lopressor 2.5 mg QID- switched to oral Lopressor 25  mg BID on 7/27 - Recommended by cardiology on 7/23 to hold off on anticoagulation due to her advanced age, positive Hemoccults and "mental status"- they recommended to start aspirin only hold Cardizem and Lopressor due to GI bleed   Active Problems: Acute lower GI bleed/ Acute blood loss anemia - Hb noted to drop from 11 to 7 this AM- reason initally was uncertain but then maroon stools started later this AM- has had 2 large ones so far - have stopped ASA and Lovenox - she does not like to follow up with doctors and family is not aware if she has ever had a colonoscopy - had a flex sig earlier in the admission as rectal stricture was seen on CT-  no significant findings  - transfusing 2nd unit of blood- due to her already frail state, family and I agree that we will not put her through a colonoscopy- they have changed her code status to DNR- will monitor in SDU overnight- if bleeding does not slow down by tomorrow we will discuss if she should be transitioned to comfort care  Hypotension - due to above- improved with blood transfusion  Constipation - resolved with laxatives - based upon CT report, there was a concern for a rectal stricture  -7/25 no evidence of rectal stricture (noted on CT) on sigmoidoscopy  Pneumonia -Left-sided infiltrate-doubtful this is aspiration  - according to the family, symptoms started prior to admission and got worse during hospitalization therefore this may be community-acquired pneumonia - WBC also noted to be rising from her baseline which is  usually in the 20s - 7/27switched vand and Fortaz to Levaquin to provide atypical coverage-  - not hypoxic- cough is the main complaint -Blood cultures from 7/23 and 7/26 are negative -MRSA PCR screen negative - Vanc discontinued  - although WBC count worse, symptoms of cough better- CXR  reveals b/l edema and no focal infiltrate - plan for 5 days of Levaquin- should stop tomorrow  Acute pulm edema/ acute diastolic  CHF - Lasix being given daily since 7/27 - will need to hold for now due to hypotension/ bleeding    UTI? -UA positive - urine culture from 7/23 negative    Urinary retention -Removed Foley and gave a voiding trial - she is noted to be voiding appropriately  Hospital induced delirium  -  probable delirium secondary to a 1st generation antihistamine benadryl [had a lot of itching] and being in the hospital - she has mild forgetfulness at baseline but per her sister, it gets much worse and "she has a different personality" when she is in the hospital - her difficulty with cooperation is new and likely related to this delirium   Acute neutrophilic dermatosis(Sweet syndrome) / CML? - Itching/rash with skin dryness - Sarna lotion PRN - apparently was given Benadryl earlier in the admission which caused increased confusion - may be a candidate for steroids as outpt once infection resolves - as WBC count continues to rise, have consulted Oncology  - she refused to see doctors as outpt and Dr Marin Olp last saw her (only saw her once) on 2012- per Dr Marin Olp, the [facility] diagosis is inaccurate- she likely had myelodysplasia- he is doing further work up via blood work - he does not want to put her through a bone marrow biopsy due to her frailty   Disposition: patient did not have a PCP and was not taking medications prior to admission. - I had a long discussion with the niece a few days ago. She will ensure patient goes to see a PCP (we will make the appt with PCP mentioned at top of note) and continue to take the medications we prescribe. For now she will be going to a SNF.    Appt with PCP: requested Code Status: DNR Family Communication: with nieces -   Disposition Plan: SNF hopefully on _0 0 mg 150 mL/hr over 60 Minutes Intravenous Every 24 hours 02/15/15 1242 02/16/15 1747   02/15/15 1430  vancomycin (VANCOCIN) 1,250 mg in sodium chloride 0.9 % 250 mL IVPB     1,250 mg 166.7 mL/hr over 90 Minutes Intravenous  Once 02/15/15 1242 02/15/15 1600   02/15/15 1400  cefTAZidime (FORTAZ) 1 g in dextrose 5 % 50 mL IVPB  Status:  Discontinued     1 g 100 mL/hr over 30 Minutes Intravenous Every 12 hours 02/15/15 1222 02/16/15 1747   02/13/15 0630  cefTRIAXone (ROCEPHIN) 1 g in dextrose 5 % 50 mL IVPB  Status:  Discontinued     1 g 100 mL/hr over 30 Minutes Intravenous Daily 02/13/15 0619 02/15/15 1222   02/09/15 1600  ciprofloxacin (  CIPRO) IVPB 400 mg     400 mg 200 mL/hr over 60 Minutes Intravenous  Once 02/09/15 1550 02/09/15 1712   02/09/15 1600  metroNIDAZOLE (FLAGYL) IVPB 500 mg  Status:  Discontinued     500 mg 100 mL/hr over 60 Minutes Intravenous  Once 02/09/15 1550 02/09/15 1804      Objective: Filed Weights   02/17/15 0438 02/18/15 0518 02/19/15 0432  Weight: 61.5 kg (135 lb 9.3 oz) 62.1 kg (136 lb 14.5 oz) 57.879 kg (127 lb 9.6 oz)    Intake/Output Summary (Last 24 hours) at 02/19/15 1532 Last data filed at 02/19/15 1445  Gross per 24 hour  Intake    302 ml  Output      0 ml  Net    302 ml     Vitals Filed Vitals:   02/19/15 1159 02/19/15 1400 02/19/15 1422 02/19/15 1445  BP: _0 94/50  Pulse: 93 105 81 104  Temp: 97.4 F (36.3 C) 97.5 F (36.4 C) 97.5 F (36.4 C) 97.4 F (36.3 C)  TempSrc: Axillary Axillary Axillary Axillary  Resp: _1 Height:      Weight:      SpO2: 94% 94% 94% 94%    Exam:  General:  Pt is alert, not in acute distress  HEENT: No icterus, No thrush, oral mucosa moist  Cardiovascular: regular rate and rhythm, S1/S2 No  murmur  Respiratory: clear to auscultation bilaterally - poor air entry- pulse ox 93% on room air  Abdomen: Soft, +Bowel sounds, non tender, non distended, no guarding  MSK: No LE edema, cyanosis or clubbing  Skin: erythematous patch covering most of her body   Data Reviewed: Basic Metabolic Panel:  Recent Labs Lab 02/15/15 1130 02/16/15 0622 02/17/15 0533 02/18/15 0835 02/19/15 0555  NA 132* 132* 132* 133* 136  K 4.6 4.6 4.3 3.9 3.9  CL 107 108 106 104 103  CO2 18* 19* 19* 21* 26  GLUCOSE 134* 122* 110* 96 96  BUN 24* 22* 27* 32* 37*  CREATININE 0.94 0.83 0.83 0.82 0.86  CALCIUM 7.8* 8.0* 8.0* 7.9* 7.8*   Liver Function Tests:  Recent Labs Lab 02/13/15 0415 02/14/15 0515  AST 24 29  ALT 12* 12*  ALKPHOS 61 60  BILITOT 0.8 0.8  PROT 5.2* 4.7*  ALBUMIN 2.5* 2.2*   No results for input(s): LIPASE, AMYLASE in the last 168 hours. No results for input(s): AMMONIA in the last 168 hours. CBC:  Recent Labs Lab 02/12/15 1740  02/14/15 0515 02/15/15 1130 02/16/15 0622 02/18/15 0835 02/19/15 0555 02/19/15 1000  WBC 35.9*  < > 44.5* 49.3* 52.1* 69.9* 66.4*  --   NEUTROABS 32.7*  --  41.9* 42.3*  --   --   --   --   HGB 12.4  < > 12.0 12.0 11.5* 10.2* 7.5* 6.8*  HCT 40.3  < > 38.1 39.0 37.2 32.3* 23.3* 21.6*  MCV 78.1  < > 77.8* 77.1* 76.7* 79.0 78.7  --   PLT 414*  < > 415* 515* 501* 725* 626*  --   < > = values in this interval not displayed. Cardiac Enzymes: No results for input(s): CKTOTAL, CKMB, CKMBINDEX, TROPONINI in the last 168 hours. BNP (last 3 results)  Recent Labs  02/18/15 0835  BNP 191.5*    ProBNP (last 3 results) No results for input(s): PROBNP in the last 8760 hours.  CBG: No results for input(s): GLUCAP in the last 168 hours.  Recent Results (from the past 240 hour(s))  MRSA PCR Screening     Status: None   Collection Time: 02/09/15  5:55 PM  Result Value Ref Range Status   MRSA by PCR NEGATIVE NEGATIVE Final    Comment:         The GeneXpert MRSA Assay (FDA approved for NASAL specimens only), is one component of a comprehensive MRSA colonization surveillance program. It is not intended to diagnose MRSA infection nor to guide or monitor treatment for MRSA infections.   Culture, blood (routine x 2)     Status: None   Collection Time: 02/12/15  5:33 PM  Result Value Ref Range Status   Specimen Description BLOOD RIGHT HAND  Final   Special Requests BOTTLES DRAWN AEROBIC AND ANAEROBIC 10CC  Final   Culture   Final    NO GROWTH 5 DAYS Performed at [facility]    Report Status 02/17/2015 FINAL  Final  Culture, blood (routine x 2)     Status: None   Collection Time: 02/12/15  5:40 PM  Result Value Ref Range Status   Specimen Description LEFT ANTECUBITAL  Final   Special Requests BOTTLES DRAWN AEROBIC AND ANAEROBIC 10CC  Final   Culture   Final    NO GROWTH 5 DAYS Performed at [facility]    Report Status 02/17/2015 FINAL  Final  Culture, Urine     Status: None   Collection Time: 02/12/15  6:45 PM  Result Value Ref Range Status   Specimen Description URINE, CATHETERIZED  Final   Special Requests Immunocompromised  Final   Culture   Final    NO GROWTH 1 DAY Performed at [facility]    Report Status 02/14/2015 FINAL  Final  Culture, blood (routine x 2) Call MD if unable to obtain prior to antibiotics being given     Status: None (Preliminary result)   Collection Time: 02/15/15  1:15 PM  Result Value Ref Range Status   Specimen Description BLOOD LEFT ARM  Final   Special Requests BOTTLES DRAWN AEROBIC AND ANAEROBIC 10CC  Final   Culture   Final    NO GROWTH 4 DAYS Performed at [facility]    Report Status PENDING  Incomplete  Culture, blood (routine x 2) Call MD if unable to obtain prior to antibiotics being given     Status: None (Preliminary result)   Collection Time: 02/15/15  1:20 PM  Result Value Ref Range Status   Specimen Description BLOOD RIGHT ARM  Final    Special Requests BOTTLES DRAWN AEROBIC AND ANAEROBIC 10CC  Final   Culture   Final    NO GROWTH 4 DAYS Performed at [facility]    Report Status PENDING  Incomplete     Studies: Dg Chest Port 1 View  02/19/2015   CLINICAL DATA:  Acute shortness of breath and lethargy today.  EXAM: PORTABLE CHEST - 1 VIEW  COMPARISON:  02/18/2015 and prior chest radiographs dating back to 12/06/2010  FINDINGS: Cardiomegaly again identified.  Left lower lung consolidation/airspace disease/ atelectasis noted.  Interstitial opacities are again identified and may represent interstitial edema.  There is no evidence of pneumothorax.  A probable small left pleural effusion noted.  No acute bony abnormalities are identified.  IMPRESSION: Little significant change from the prior study with continued left lower lung consolidation/atelectasis and interstitial opacities which may represent edema. Question small left pleural effusion.   Electronically Signed   By: Margarette Canada  M.D.   On: 02/19/2015 10:14   Dg Chest Port 1 View  02/18/2015   CLINICAL DATA:  Pneumonia. History of interstitial lung disease. History of leukemia.  EXAM: PORTABLE CHEST - 1 VIEW  COMPARISON:  Chest x-rays dated 02/15/2015 and 02/12/2015  FINDINGS: Mild cardiomegaly is unchanged. Again noted is mild central pulmonary vascular congestion and bilateral interstitial thickening, perhaps slightly worsened on the left compared to the previous studies. Suspect small left pleural effusion and left basilar atelectasis.  IMPRESSION: 1. Cardiomegaly with central pulmonary vascular congestion and bilateral interstitial edema which appears slightly worsened on the left when compared to previous exams. Suspect a mild volume overload/CHF superimposed on chronic interstitial fibrosis, and slightly worsened fluid status in the short-term interval. 2. Probable small left pleural effusion and left basilar atelectasis.   Electronically Signed   By: Franki Cabot M.D.    On: 02/18/2015 10:33    Scheduled Meds:  Scheduled Meds: . sodium chloride   Intravenous Once  . digoxin  0.125 mg Oral Daily  . feeding supplement (ENSURE ENLIVE)  237 mL Oral TID BM  . folic acid  1 mg Oral Daily  . levofloxacin (LEVAQUIN) IV  750 mg Intravenous Q48H   Continuous Infusions:    Time spent on care of this patient: 40 min   [place], MD 02/19/2015, 3:32 PM  LOS: 10 days   [facility] Office  [phone] Pager - Text Page per [email or web address] If 7PM-7AM, please contact night-coverage [email or web address]

## 2015-02-19 NOTE — Progress Notes (Signed)
Pt had a large loose maroon color stool. MD notified. Will continue to monitor.

## 2015-02-19 NOTE — Progress Notes (Signed)
CRITICAL VALUE ALERT  Critical value received:  HGB 6.8  Date of notification:  02/19/15  Time of notification:  4801  Critical value read back:Yes.    Nurse who received alert:  Star Age, RN   MD notified (1st page):  Dr. Wynelle Cleveland  Time of first page:  1041  MD notified (2nd page):  Time of second page:  Responding MD: Dr. Wynelle Cleveland  Time MD responded:  1055

## 2015-02-19 NOTE — Progress Notes (Signed)
[place] Progress Note Patient Name: [name] DOB: 04-21-1923 MRN: [id number]   Date of Service  02/19/2015  HPI/Events of Note  Needs stress ulcer prophylaxis. Patient with GI bleed.   eICU Interventions  Will order SCD's.     Intervention Category Intermediate Interventions: Best-practice therapies (e.g. DVT, beta blocker, etc.)  Sommer,Steven Eugene 02/19/2015, 9:47 PM

## 2015-02-19 NOTE — Progress Notes (Signed)
Pt HR 96-130, BP 98/35. MD notified and request I hold Lasix until after MD see pt. Will continue to monitor.

## 2015-02-20 ENCOUNTER — Inpatient Hospital Stay (HOSPITAL_COMMUNITY): Payer: Medicare Other

## 2015-02-20 DIAGNOSIS — K921 Melena: Secondary | ICD-10-CM

## 2015-02-20 LAB — BASIC METABOLIC PANEL
Anion gap: 9 (ref 5–15)
BUN: 62 mg/dL — AB (ref 6–20)
CO2: 24 mmol/L (ref 22–32)
Calcium: 7.8 mg/dL — ABNORMAL LOW (ref 8.9–10.3)
Chloride: 106 mmol/L (ref 101–111)
Creatinine, Ser: 0.95 mg/dL (ref 0.44–1.00)
GFR calc Af Amer: 60 mL/min — ABNORMAL LOW (ref >60)
GFR, EST NON AFRICAN AMERICAN: 52 mL/min — AB (ref >60)
Glucose, Bld: 110 mg/dL — ABNORMAL HIGH (ref 65–99)
Potassium: 4.2 mmol/L (ref 3.5–5.1)
Sodium: 139 mmol/L (ref 135–145)

## 2015-02-20 LAB — CBC
HCT: 24.7 % — ABNORMAL LOW (ref 36.0–46.0)
HCT: 23.6 % — ABNORMAL LOW (ref 36.0–46.0)
HEMATOCRIT: 23.6 % — AB (ref 36.0–46.0)
Hemoglobin: 8.2 g/dL — ABNORMAL LOW (ref 12.0–15.0)
Hemoglobin: 7.7 g/dL — ABNORMAL LOW (ref 12.0–15.0)
Hemoglobin: 7.8 g/dL — ABNORMAL LOW (ref 12.0–15.0)
MCH: 27.2 pg (ref 26.0–34.0)
MCH: 27 pg (ref 26.0–34.0)
MCH: 27.7 pg (ref 26.0–34.0)
MCHC: 33.2 g/dL (ref 30.0–36.0)
MCHC: 32.6 g/dL (ref 30.0–36.0)
MCHC: 33.1 g/dL (ref 30.0–36.0)
MCV: 81.8 fL (ref 78.0–100.0)
MCV: 82.8 fL (ref 78.0–100.0)
MCV: 83.7 fL (ref 78.0–100.0)
PLATELETS: 492 10*3/uL — AB (ref 150–400)
PLATELETS: 476 10*3/uL — AB (ref 150–400)
Platelets: 512 10*3/uL — ABNORMAL HIGH (ref 150–400)
RBC: 3.02 MIL/uL — AB (ref 3.87–5.11)
RBC: 2.85 MIL/uL — ABNORMAL LOW (ref 3.87–5.11)
RBC: 2.82 MIL/uL — ABNORMAL LOW (ref 3.87–5.11)
RDW: 17.9 % — ABNORMAL HIGH (ref 11.5–15.5)
RDW: 18.1 % — ABNORMAL HIGH (ref 11.5–15.5)
RDW: 18.5 % — AB (ref 11.5–15.5)
WBC: 77.8 10*3/uL — AB (ref 4.0–10.5)
WBC: 76 10*3/uL (ref 4.0–10.5)
WBC: 84 10*3/uL — AB (ref 4.0–10.5)

## 2015-02-20 LAB — CULTURE, BLOOD (ROUTINE X 2)
Culture: NO GROWTH
Culture: NO GROWTH

## 2015-02-20 MED ORDER — DILTIAZEM HCL 100 MG IV SOLR
5.0000 mg/h | INTRAVENOUS | Status: DC
  Administered 2015-02-20: 5 mg/h via INTRAVENOUS
  Filled 2015-02-20: qty 100

## 2015-02-20 MED ORDER — DIGOXIN 0.25 MG/ML IJ SOLN
0.2500 mg | Freq: Once | INTRAMUSCULAR | Status: AC
  Administered 2015-02-20: 0.25 mg via INTRAVENOUS
  Filled 2015-02-20: qty 1

## 2015-02-20 NOTE — Progress Notes (Addendum)
Per Dr. Wynelle Cleveland SBP acceptable in the 90-100's with MAP from the 50-60's while on cardizem gtt

## 2015-02-20 NOTE — Progress Notes (Signed)
[facility] Progress Note   [name] MHD:[id number] DOB: 02-Jun-1923 DOA: 02/09/2015 PCP: [name], MD  Brief narrative: [name] is a 81 y.o. female with A. fib not on anticoagulation due to memory issues, CML, sweet syndrome, interstitial lung disease who presented to the hospital with constipation for 1 week and lower abdominal pain. CT scan of the abdomen and pelvis was suspicious for rectal stricture. Rectal exam showed rectal impaction and disimpaction was done by the ER doctor, Dr. Darl Householder. Stool was heme positive.   She was noted to have A. fib with RVR and was started on Cardizem infusion. [facility] was consulted by the ER and per request, she was admitted to the hospitalist group. She was started on Laxatives to help prep her for sigmoidoscopy but she was quite agitated overnight and did not take the prep. Despite being given enemas, she had minimal stool output. She underwent a sigmoidoscopy on 7/25. Initial exam prior to this procedure revealed no abnormalities in the rectum. Scope was advanced up to the splenic flexure and no lesions and no stricture was noted.  Did have urinary retention on 7/23-Foley catheter placed - 1000 cc of urine emptied. Started on empiric Rocephin on 7/24  - UA was noted to be positive on admission  On the evening after the sigmoidoscopy on th 25th, the patient developed a severe congested cough. The family notes that she had a mild cough prior to admission but it was definitely much worse on that evening. A chest x-ray was done on the morning of the 26th and it revealed a patchy sided infiltrate. Treatment for pneumonia was begun with ceftaz and vancomycin.  On 7/30, she was noted to have dropped her Hb by 3 gm from the day before- she later started having maroon stools    Subjective: Sleepy but more alert than yesterday. No complaints.   Assessment/Plan: Principal Problem:   Atrial fibrillation with RVR -This improved with Cardizem  infusion- she was switched to oral diltiazem   -Digoxin was continued - also on Iv Lopressor 2.5 mg QID- switched to oral Lopressor 25 mg BID on 7/27 - Recommended by cardiology on 7/23 to hold off on anticoagulation due to her advanced age, positive Hemoccults and mental status- they recommended to start aspirin only - due to Gi bleeding, and hypotension, held Lopressor and Cardizem - will resume Cardizem today as an infusion & re-load with Dig as BP still borderline - per cardiology, not a candidate for amiodarone due to ILD - hold off ALL anticoagulation from here on  Hypotension - BP dropped to 21J systolic due to above bleed   Pneumonia -Left-sided infiltrate-doubtful this is aspiration  - according to the family, symptoms started prior to admission and got worse during hospitalization therefore this may be community-acquired pneumonia - WBC also noted to be rising from her baseline which is usually in the 20s - 7/27switched vand and Fortaz to Levaquin to provide atypical coverage-  - not hypoxic- cough is the main complaint -Blood cultures from 7/23 and 7/26 are negative -MRSA PCR screen negative - Vanc discontinued  - although WBC count worse, symptoms of cough better- CXR  reveals b/l edema and no focal infiltrate - cont Levaquin   Acute pulm edema/ acute diastolic CHF - Lasix being given daily since 7/27 - will need to hold for now due to hypotension/ bleeding - she still has pulm edema but no resp distress- will control HR to help prevent further edema  Acute lower GI  bleed/ Acute blood loss anemia - was hemoccult positive when checked in the ER - Hb noted to drop from 11 to 7 this AM- reason initally was uncertain but then maroon stools started later this AM- has had 2 large ones so far - have stopped ASA and Lovenox - she does not like to follow up with doctors and family is not aware if she has ever had a colonoscopy - had a flex sig earlier in the admission as rectal  stricture was seen on CT-  no significant findings  - received 2 UPRBC on 7/30 -Hb > 8 now but had a blood stool at 3 AM- cont to follow Hb and amount of bloody stool and transfuse - 3 maroon stools so far - placed on BID PPI- hold off on GI procedures for now as she is still quite unstable   Constipation - resolved with laxatives - based upon CT report, there was a concern for a rectal stricture  -7/25 no evidence of rectal stricture (noted on CT) on sigmoidoscopy    UTI? -UA positive - urine culture from 7/23 negative    Urinary retention -Removed Foley and gave a voiding trial - she is noted to be voiding appropriately  Hospital induced delirium  -  probable delirium secondary to a 1st generation antihistamine benadryl [had a lot of itching] and being in the hospital - she has mild forgetfulness at baseline but per her sister, it gets much worse and "she has a different personality" when she is in the hospital - her difficulty with cooperation is new and likely related to this delirium   Acute neutrophilic dermatosis(Sweet syndrome) / CML? - Itching/rash with skin dryness - Sarna lotion PRN - apparently was given Benadryl earlier in the admission which caused increased confusion - may be a candidate for steroids as outpt once infection resolves - as WBC count continues to rise, have consulted Oncology  - she refused to see doctors as outpt and Dr Marin Olp last saw her (only saw her once) on 2012- per Dr Marin Olp, the [facility] diagosis is inaccurate- she likely had myelodysplasia- he is doing further work up via blood work - he does not want to put her through a bone marrow biopsy due to her frailty   Disposition: patient did not have a PCP and was not taking medications prior to admission. - I had a long discussion with the niece a few days ago. She will ensure patient goes to see a PCP (we will make the appt with PCP mentioned at top of note) and continue to take the medications we  prescribe. For now she will be going to a SNF.    Appt with PCP: requested Code Status: DNR- discussed in detail with sister and neices Family Communication: with nieces -   Disposition Plan: SNF when stable DVT prophylaxis: SCDs Consultants:cardiology, GI Procedures: Flex Sig  Antibiotics: Anti-infectives    Start     Dose/Rate Route Frequency Ordered Stop   02/16/15 1800  levofloxacin (LEVAQUIN) IVPB 750 mg     750 mg 100 mL/hr over 90 Minutes Intravenous Every 48 hours 02/16/15 1747     02/16/15 1400  vancomycin (VANCOCIN) IVPB 750 mg/150 ml premix  Status:  Discontinued     750 mg 150 mL/hr over 60 Minutes Intravenous Every 24 hours 02/15/15 1242 02/16/15 1747   02/15/15 1430  vancomycin (VANCOCIN) 1,250 mg in sodium chloride 0.9 % 250 mL IVPB     1,250 mg 166.7 mL/hr over  90 Minutes Intravenous  Once 02/15/15 1242 02/15/15 1600   02/15/15 1400  cefTAZidime (FORTAZ) 1 g in dextrose 5 % 50 mL IVPB  Status:  Discontinued     1 g 100 mL/hr over 30 Minutes Intravenous Every 12 hours 02/15/15 1222 02/16/15 1747   02/13/15 0630  cefTRIAXone (ROCEPHIN) 1 g in dextrose 5 % 50 mL IVPB  Status:  Discontinued     1 g 100 mL/hr over 30 Minutes Intravenous Daily 02/13/15 0619 02/15/15 1222   02/09/15 1600  ciprofloxacin (CIPRO) IVPB 400 mg     400 mg 200 mL/hr over 60 Minutes Intravenous  Once 02/09/15 1550 02/09/15 1712   02/09/15 1600  metroNIDAZOLE (FLAGYL) IVPB 500 mg  Status:  Discontinued     500 mg 100 mL/hr over 60 Minutes Intravenous  Once 02/09/15 1550 02/09/15 1804      Objective: Filed Weights   02/18/15 0518 02/19/15 0432 02/19/15 1628  Weight: 62.1 kg (136 lb 14.5 oz) 57.879 kg (127 lb 9.6 oz) 58.5 kg (128 lb 15.5 oz)    Intake/Output Summary (Last 24 hours) at 02/20/15 1002 Last data filed at 02/19/15 1757  Gross per 24 hour  Intake    686 ml  Output      0 ml  Net    686 ml     Vitals Filed Vitals:   02/20/15 0900 02/20/15 0915 02/20/15 0930 02/20/15 0945   BP: 96/39 89/28 107/37 109/42  Pulse: 96 73 90 133  Temp:      TempSrc:      Resp: 21 39 19 25  Height:      Weight:      SpO2: 93% 97% 96% 95%    Exam:  General:  Pt is alert, not in acute distress  HEENT: No icterus, No thrush, oral mucosa moist  Cardiovascular: IIRR, HR 100-130s at rest,  S1/S2 No murmur  Respiratory: clear to auscultation bilaterally - poor air entry- pulse ox 93% on 2L  Abdomen: Soft, +Bowel sounds, non tender, non distended, no guarding  MSK: No LE edema, cyanosis or clubbing  Skin: erythematous patchs covering most of her body   Data Reviewed: Basic Metabolic Panel:  Recent Labs Lab 02/16/15 0622 02/17/15 0533 02/18/15 0835 02/19/15 0555 02/20/15 0358  NA 132* 132* 133* 136 139  K 4.6 4.3 3.9 3.9 4.2  CL 108 106 104 103 106  CO2 19* 19* 21* 26 24  GLUCOSE 122* 110* 96 96 110*  BUN 22* 27* 32* 37* 62*  CREATININE 0.83 0.83 0.82 0.86 0.95  CALCIUM 8.0* 8.0* 7.9* 7.8* 7.8*   Liver Function Tests:  Recent Labs Lab 02/14/15 0515  AST 29  ALT 12*  ALKPHOS 60  BILITOT 0.8  PROT 4.7*  ALBUMIN 2.2*   No results for input(s): LIPASE, AMYLASE in the last 168 hours. No results for input(s): AMMONIA in the last 168 hours. CBC:  Recent Labs Lab 02/14/15 0515 02/15/15 1130  02/18/15 0835 02/19/15 0555 02/19/15 1000 02/19/15 2016 02/19/15 2344 02/20/15 0358  WBC 44.5* 49.3*  < > 69.9* 66.4*  --  82.2* 79.2* 77.8*  NEUTROABS 41.9* 42.3*  --   --   --   --   --   --   --   HGB 12.0 12.0  < > 10.2* 7.5* 6.8* 9.4* 9.1* 8.2*  HCT 38.1 39.0  < > 32.3* 23.3* 21.6* 28.1* 26.9* 24.7*  MCV 77.8* 77.1*  < > 79.0 78.7  --  81.7 80.8  81.8  PLT 415* 515*  < > 725* 626*  --  465* 483* 492*  < > = values in this interval not displayed. Cardiac Enzymes: No results for input(s): CKTOTAL, CKMB, CKMBINDEX, TROPONINI in the last 168 hours. BNP (last 3 results)  Recent Labs  02/18/15 0835  BNP 191.5*    ProBNP (last 3 results) No results  for input(s): PROBNP in the last 8760 hours.  CBG: No results for input(s): GLUCAP in the last 168 hours.  Recent Results (from the past 240 hour(s))  Culture, blood (routine x 2)     Status: None   Collection Time: 02/12/15  5:33 PM  Result Value Ref Range Status   Specimen Description BLOOD RIGHT HAND  Final   Special Requests BOTTLES DRAWN AEROBIC AND ANAEROBIC 10CC  Final   Culture   Final    NO GROWTH 5 DAYS Performed at [facility]    Report Status 02/17/2015 FINAL  Final  Culture, blood (routine x 2)     Status: None   Collection Time: 02/12/15  5:40 PM  Result Value Ref Range Status   Specimen Description LEFT ANTECUBITAL  Final   Special Requests BOTTLES DRAWN AEROBIC AND ANAEROBIC 10CC  Final   Culture   Final    NO GROWTH 5 DAYS Performed at [facility]    Report Status 02/17/2015 FINAL  Final  Culture, Urine     Status: None   Collection Time: 02/12/15  6:45 PM  Result Value Ref Range Status   Specimen Description URINE, CATHETERIZED  Final   Special Requests Immunocompromised  Final   Culture   Final    NO GROWTH 1 DAY Performed at [facility]    Report Status 02/14/2015 FINAL  Final  Culture, blood (routine x 2) Call MD if unable to obtain prior to antibiotics being given     Status: None (Preliminary result)   Collection Time: 02/15/15  1:15 PM  Result Value Ref Range Status   Specimen Description BLOOD LEFT ARM  Final   Special Requests BOTTLES DRAWN AEROBIC AND ANAEROBIC 10CC  Final   Culture   Final    NO GROWTH 4 DAYS Performed at [facility]    Report Status PENDING  Incomplete  Culture, blood (routine x 2) Call MD if unable to obtain prior to antibiotics being given     Status: None (Preliminary result)   Collection Time: 02/15/15  1:20 PM  Result Value Ref Range Status   Specimen Description BLOOD RIGHT ARM  Final   Special Requests BOTTLES DRAWN AEROBIC AND ANAEROBIC 10CC  Final   Culture   Final    NO GROWTH 4  DAYS Performed at [facility]    Report Status PENDING  Incomplete     Studies: Dg Chest Port 1 View  02/20/2015   CLINICAL DATA:  Acute hypoxia.  EXAM: PORTABLE CHEST - 1 VIEW  COMPARISON:  02/19/2015 and prior radiographs  FINDINGS: Mild cardiomegaly again noted.  A small left pleural effusion and left lower lung consolidation/atelectasis again noted.  Interstitial opacities, left-greater-than-right, are again noted and may represent edema.  There is no evidence of pneumothorax.  There has been little interval change since the prior study.  IMPRESSION: Little interval change with continued small left pleural effusion, left lower lung consolidation/ atelectasis and question interstitial edema.   Electronically Signed   By: Margarette Canada M.D.   On: 02/20/2015 08:05   Dg Chest Port 1  View  02/19/2015   CLINICAL DATA:  Acute shortness of breath and lethargy today.  EXAM: PORTABLE CHEST - 1 VIEW  COMPARISON:  02/18/2015 and prior chest radiographs dating back to 12/06/2010  FINDINGS: Cardiomegaly again identified.  Left lower lung consolidation/airspace disease/ atelectasis noted.  Interstitial opacities are again identified and may represent interstitial edema.  There is no evidence of pneumothorax.  A probable small left pleural effusion noted.  No acute bony abnormalities are identified.  IMPRESSION: Little significant change from the prior study with continued left lower lung consolidation/atelectasis and interstitial opacities which may represent edema. Question small left pleural effusion.   Electronically Signed   By: Margarette Canada M.D.   On: 02/19/2015 10:14   Dg Chest Port 1 View  02/18/2015   CLINICAL DATA:  Pneumonia. History of interstitial lung disease. History of leukemia.  EXAM: PORTABLE CHEST - 1 VIEW  COMPARISON:  Chest x-rays dated 02/15/2015 and 02/12/2015  FINDINGS: Mild cardiomegaly is unchanged. Again noted is mild central pulmonary vascular congestion and bilateral interstitial  thickening, perhaps slightly worsened on the left compared to the previous studies. Suspect small left pleural effusion and left basilar atelectasis.  IMPRESSION: 1. Cardiomegaly with central pulmonary vascular congestion and bilateral interstitial edema which appears slightly worsened on the left when compared to previous exams. Suspect a mild volume overload/CHF superimposed on chronic interstitial fibrosis, and slightly worsened fluid status in the short-term interval. 2. Probable small left pleural effusion and left basilar atelectasis.   Electronically Signed   By: Franki Cabot M.D.   On: 02/18/2015 10:33    Scheduled Meds:  Scheduled Meds: . sodium chloride   Intravenous Once  . digoxin  0.25 mg Intravenous Once  . digoxin  0.125 mg Oral Daily  . levofloxacin (LEVAQUIN) IV  750 mg Intravenous Q48H  . pantoprazole (PROTONIX) IV  40 mg Intravenous Q12H   Continuous Infusions: . diltiazem (CARDIZEM) infusion 5 mg/hr (02/20/15 0845)    Time spent on care of this patient: 64 min   [place], MD 02/20/2015, 10:02 AM  LOS: 11 days   [facility] Office  [phone] Pager - Text Page per [email or web address] If 7PM-7AM, please contact night-coverage [email or web address]

## 2015-02-21 DIAGNOSIS — R531 Weakness: Secondary | ICD-10-CM | POA: Insufficient documentation

## 2015-02-21 DIAGNOSIS — D62 Acute posthemorrhagic anemia: Secondary | ICD-10-CM | POA: Insufficient documentation

## 2015-02-21 DIAGNOSIS — Z515 Encounter for palliative care: Secondary | ICD-10-CM

## 2015-02-21 DIAGNOSIS — J189 Pneumonia, unspecified organism: Secondary | ICD-10-CM | POA: Insufficient documentation

## 2015-02-21 DIAGNOSIS — Z66 Do not resuscitate: Secondary | ICD-10-CM

## 2015-02-21 DIAGNOSIS — D47Z9 Other specified neoplasms of uncertain behavior of lymphoid, hematopoietic and related tissue: Secondary | ICD-10-CM

## 2015-02-21 DIAGNOSIS — K921 Melena: Secondary | ICD-10-CM | POA: Insufficient documentation

## 2015-02-21 DIAGNOSIS — D469 Myelodysplastic syndrome, unspecified: Secondary | ICD-10-CM

## 2015-02-21 LAB — BASIC METABOLIC PANEL
Anion gap: 6 (ref 5–15)
BUN: 49 mg/dL — ABNORMAL HIGH (ref 6–20)
CO2: 24 mmol/L (ref 22–32)
Calcium: 7.7 mg/dL — ABNORMAL LOW (ref 8.9–10.3)
Chloride: 105 mmol/L (ref 101–111)
Creatinine, Ser: 0.78 mg/dL (ref 0.44–1.00)
GFR calc Af Amer: >60 mL/min (ref >60)
GFR calc non Af Amer: >60 mL/min (ref >60)
Glucose, Bld: 98 mg/dL (ref 65–99)
Potassium: 4.1 mmol/L (ref 3.5–5.1)
SODIUM: 135 mmol/L (ref 135–145)

## 2015-02-21 LAB — CBC
HCT: 21 % — ABNORMAL LOW (ref 36.0–46.0)
HCT: 25.7 % — ABNORMAL LOW (ref 36.0–46.0)
Hemoglobin: 6.9 g/dL — CL (ref 12.0–15.0)
Hemoglobin: 8.4 g/dL — ABNORMAL LOW (ref 12.0–15.0)
MCH: 27.6 pg (ref 26.0–34.0)
MCH: 27.6 pg (ref 26.0–34.0)
MCHC: 32.7 g/dL (ref 30.0–36.0)
MCHC: 32.9 g/dL (ref 30.0–36.0)
MCV: 84.5 fL (ref 78.0–100.0)
MCV: 84 fL (ref 78.0–100.0)
PLATELETS: 459 10*3/uL — AB (ref 150–400)
Platelets: 488 10*3/uL — ABNORMAL HIGH (ref 150–400)
RBC: 2.5 MIL/uL — ABNORMAL LOW (ref 3.87–5.11)
RBC: 3.04 MIL/uL — AB (ref 3.87–5.11)
RDW: 17.2 % — ABNORMAL HIGH (ref 11.5–15.5)
RDW: 18.8 % — ABNORMAL HIGH (ref 11.5–15.5)
WBC: 77.2 10*3/uL (ref 4.0–10.5)
WBC: 82.5 10*3/uL (ref 4.0–10.5)

## 2015-02-21 LAB — PREPARE RBC (CROSSMATCH)

## 2015-02-21 MED ORDER — DILTIAZEM HCL 30 MG PO TABS
30.0000 mg | ORAL_TABLET | Freq: Four times a day (QID) | ORAL | Status: DC
  Administered 2015-02-21 – 2015-02-22 (×3): 30 mg via ORAL
  Filled 2015-02-21 (×3): qty 1

## 2015-02-21 MED ORDER — ENSURE ENLIVE PO LIQD
237.0000 mL | Freq: Three times a day (TID) | ORAL | Status: DC
  Administered 2015-02-21 – 2015-02-23 (×2): 237 mL via ORAL

## 2015-02-21 MED ORDER — HYDROCOD POLST-CPM POLST ER 10-8 MG/5ML PO SUER
5.0000 mL | Freq: Two times a day (BID) | ORAL | Status: DC | PRN
Start: 2015-02-21 — End: 2015-02-23
  Administered 2015-02-21: 5 mL via ORAL
  Filled 2015-02-21: qty 5

## 2015-02-21 MED ORDER — SODIUM CHLORIDE 0.9 % IV SOLN
Freq: Once | INTRAVENOUS | Status: AC
  Administered 2015-02-21: 10 mL via INTRAVENOUS

## 2015-02-21 NOTE — Progress Notes (Addendum)
[facility] Progress Note   [name] OFB:[id number] DOB: [birth year] DOA: 02/09/2015 PCP: [name], MD  Brief narrative: [name] is a 81 y.o. female with A. 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 CT scan of the abdomen and pelvis was suspicious for rectal stricture. Rectal exam showed rectal impaction and disimpaction was done by the ER doctor, Dr. Darl Householder. Stool was heme positive.   She was noted to have A. fib with RVR and was started on Cardizem infusion. [facility] was consulted by the ER and per request, she was admitted to the hospitalist group. She was started on Laxatives to help prep her for sigmoidoscopy but she was quite agitated overnight and did not take the prep. Despite being given enemas, she had minimal stool output. She underwent a sigmoidoscopy on 7/25. Initial exam prior to this procedure revealed no abnormalities in the rectum. Scope was advanced up to the splenic flexure and no lesions and no stricture was noted.  Did have urinary retention on 7/23-Foley catheter placed - 1000 cc of urine emptied. Started on empiric Rocephin on 7/24  - UA was noted to be positive on admission  On the evening after the sigmoidoscopy on th 25th, the patient developed a severe congested cough. The family notes that she had a mild cough prior to admission but it was definitely much worse on that evening. A chest x-ray was done on the morning of the 26th and it revealed a patchy sided infiltrate. Treatment for pneumonia was begun with ceftaz and vancomycin.  On 7/30, she was noted to have dropped her Hb by 3 gm from the day before- she later started having maroon stools.    Subjective: Awake and alert and pleasant today. Cannot recall the events of the past 2 days.  Currently has no complaints. She not aware of having any bloody bowel movements. She has no cough or shortness of breath.  Assessment/Plan: Principal Problem:   Atrial fibrillation with RVR -This improved with Cardizem infusion- she was switched to oral diltiazem   -Digoxin was continued - also on Iv Lopressor 2.5 mg QID- switched to oral Lopressor 25 mg BID on 7/27 - Recommended by cardiology on 7/23 to hold off on anticoagulation due to her advanced age, positive Hemoccults and mental status- they recommended to start aspirin only - due to Gi bleeding, and hypotension, held Lopressor and Cardizem - Resumed Cardizem infusion and given a load of IV digoxin on 7/31 for A. fib with RVR and heart rate came under control - due to hypotension, Cardizem infusion was discontinued on the evening of 7/31-at this time she is not on any rate controlling agents other than digoxin due to systolic blood pressure in the 90s-heart rate is a-fib controlled in the 70s - per cardiology, not a candidate for amiodarone due to ILD - hold off ALL anticoagulation from here on  Hypotension - BP dropped to 78E systolic due to above bleed-currently still in the 90s to low 100s range- holding off on hydration as still slightly fluid overloaded  Pneumonia -Left-sided infiltrate-doubtful this is aspiration  - according to the family, symptoms started prior to admission and got worse during hospitalization therefore this may be community-acquired pneumonia - WBC also noted to be rising from her baseline which is usually in the 20s -  7/27switched vand and Fortaz to Levaquin to provide atypical coverage-  - not hypoxic- cough is the main complaint -Blood cultures from 7/23 and 7/26 are negative -MRSA PCR screen negative - Vanc discontinued  - although WBC count worse, symptoms of cough better- CXR  reveals b/l edema and no focal infiltrate - cont Levaquin -  will DC tomorrow after a 7 day course  Acute pulm edema/ acute  diastolic CHF - Lasix being given daily since 7/27-  Held due to hypotension/ bleeding - she still has pulm edema but no resp distress- will control HR to help prevent further edema - Pulse ox has improved today to 92% on room air -start incentive spirometry and mobilize out of bed to reduce atelectasis   Acute lower GI bleed/ Acute blood loss anemia - was hemoccult positive when checked in the ER - Hb noted to drop from 11 to 7 this AM- reason initally was uncertain but then maroon stools started later this AM- has had 2 large ones so far - have stopped ASA and Lovenox - she does not like to follow up with doctors and family is not aware if she has ever had a colonoscopy - had a flex sig earlier in the admission as rectal stricture was seen on CT-  no significant findings  - received 2 UPRBC on 7/30 -last bloody stool at 3 AM on 7/31-required 1 more unit of packed red blood cells as hemoglobin drifted down to 6.9-hemoglobin up to 8.4 this morning-  - placed on BID PPI- hold off on GI procedures for now - I recommend a conservative approach- neice agrees -advance to full liquids today   Constipation - resolved with laxatives - based upon CT report, there was a concern for a rectal stricture  -7/25 no evidence of rectal stricture (noted on CT) on sigmoidoscopy    UTI? 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 - Itching/rash with skin dryness - Sarna lotion PRN - apparently was given Benadryl earlier in the  admission which caused increased confusion - may be a candidate for steroids as outpt once infection resolves - as WBC count continues to rise, have consulted Oncology  - she refused to see doctors as outpt and Dr Marin Olp last saw her (only saw her once) on 2012- per Dr Marin Olp, the [facility] diagosis is inaccurate- she likely had myelodysplasia- he is doing further work up via blood work - he does not want to put her through a bone marrow biopsy due to her frailty - his note today states that 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 He recommends palliative/ hospice approach with I agree with as well    Disposition: patient did not have a PCP and was not taking medications prior to admission. - I had a long discussion with the niece a few days ago. She will ensure patient goes to see a PCP (we will make the appt with PCP mentioned at top of note) and continue to take the medications  we prescribe. For now she will be going to a SNF - consulted palliative care today as well.    Appt with PCP: requested Code Status: DNR- discussed in detail with sister and neices Family Communication: with nieces -   Disposition Plan: SNF when stable DVT prophylaxis: SCDs Consultants:cardiology, GI, hematology, palliative  Procedures: Flex Sig  Antibiotics: Anti-infectives    Start     Dose/Rate Route Frequency Ordered Stop   02/16/15 1800  levofloxacin (LEVAQUIN) IVPB 750 mg     750 mg 100 mL/hr over 90 Minutes Intravenous Every 48 hours 02/16/15 1747     02/16/15 1400  vancomycin (VANCOCIN) IVPB 750 mg/150 ml premix  Status:  Discontinued     750 mg 150 mL/hr over 60 Minutes Intravenous Every 24 hours 02/15/15 1242 02/16/15 1747   02/15/15 1430  vancomycin (VANCOCIN) 1,250 mg in sodium chloride 0.9 % 250 mL IVPB     1,250 mg 166.7 mL/hr over 90 Minutes Intravenous  Once 02/15/15 1242 02/15/15 1600   02/15/15 1400  cefTAZidime (FORTAZ) 1 g in dextrose 5 % 50 mL IVPB  Status:   Discontinued     1 g 100 mL/hr over 30 Minutes Intravenous Every 12 hours 02/15/15 1222 02/16/15 1747   02/13/15 0630  cefTRIAXone (ROCEPHIN) 1 g in dextrose 5 % 50 mL IVPB  Status:  Discontinued     1 g 100 mL/hr over 30 Minutes Intravenous Daily 02/13/15 0619 02/15/15 1222   02/09/15 1600  ciprofloxacin (CIPRO) IVPB 400 mg     400 mg 200 mL/hr over 60 Minutes Intravenous  Once 02/09/15 1550 02/09/15 1712   02/09/15 1600  metroNIDAZOLE (FLAGYL) IVPB 500 mg  Status:  Discontinued     500 mg 100 mL/hr over 60 Minutes Intravenous  Once 02/09/15 1550 02/09/15 1804      Objective: Filed Weights   02/19/15 1628 02/20/15 1800 02/21/15 0600  Weight: 58.5 kg (128 lb 15.5 oz) 57.4 kg (126 lb 8.7 oz) 58 kg (127 lb 13.9 oz)    Intake/Output Summary (Last 24 hours) at 02/21/15 1048 Last data filed at 02/21/15 0800  Gross per 24 hour  Intake    740 ml  Output   1585 ml  Net   -845 ml     Vitals Filed Vitals:   02/21/15 0419 02/21/15 0600 02/21/15 0800 02/21/15 1005  BP: 106/32 102/39 102/40   Pulse: 83 89 73 91  Temp: 97.1 F (36.2 C)  97.6 F (36.4 C)   TempSrc: Axillary  Oral   Resp: _0 Height:      Weight:  58 kg (127 lb 13.9 oz)    SpO2: 98% 91% 96%     Exam:  General:  Pt is alert, not in acute distress  HEENT: No icterus, No thrush, oral mucosa moist  Cardiovascular: IIRR, HR 100-130s at rest,  S1/S2 No murmur  Respiratory: clear to auscultation bilaterally - poor air entry- pulse ox 93% on 2L  Abdomen: Soft, +Bowel sounds, non tender, non distended, no guarding  MSK: No LE edema, cyanosis or clubbing  Skin: erythematous patchs covering most of her body   Data Reviewed: Basic Metabolic Panel:  Recent Labs Lab 02/17/15 0533 02/18/15 0835 02/19/15 0555 02/20/15 0358 02/21/15 0620  NA 132* 133* 136 139 135  K 4.3 3.9 3.9 4.2 4.1  CL 106 104 103 106 105  CO2 19* 21* _1 GLUCOSE 110* 96 96 110* 98  BUN 27* 32*  37* 62* 49*  CREATININE 0.83  0.82 0.86 0.95 0.78  CALCIUM 8.0* 7.9* 7.8* 7.8* 7.7*   Liver Function Tests: No results for input(s): AST, ALT, ALKPHOS, BILITOT, PROT, ALBUMIN in the last 168 hours. No results for input(s): LIPASE, AMYLASE in the last 168 hours. No results for input(s): AMMONIA in the last 168 hours. CBC:  Recent Labs Lab 02/15/15 1130  02/20/15 0358 02/20/15 1145 02/20/15 1812 02/21/15 0015 02/21/15 0620  WBC 49.3*  < > 77.8* 76.0* 84.0* 82.5* 77.2*  NEUTROABS 42.3*  --   --   --   --   --   --   HGB 12.0  < > 8.2* 7.7* 7.8* 6.9* 8.4*  HCT 39.0  < > 24.7* 23.6* 23.6* 21.0* 25.7*  MCV 77.1*  < > 81.8 82.8 83.7 84.0 84.5  PLT 515*  < > 492* 476* 512* 488* 459*  < > = values in this interval not displayed. Cardiac Enzymes: No results for input(s): CKTOTAL, CKMB, CKMBINDEX, TROPONINI in the last 168 hours. BNP (last 3 results)  Recent Labs  02/18/15 0835  BNP 191.5*    ProBNP (last 3 results) No results for input(s): PROBNP in the last 8760 hours.  CBG: No results for input(s): GLUCAP in the last 168 hours.  Recent Results (from the past 240 hour(s))  Culture, blood (routine x 2)     Status: None   Collection Time: 02/12/15  5:33 PM  Result Value Ref Range Status   Specimen Description BLOOD RIGHT HAND  Final   Special Requests BOTTLES DRAWN AEROBIC AND ANAEROBIC 10CC  Final   Culture   Final    NO GROWTH 5 DAYS Performed at [facility]    Report Status 02/17/2015 FINAL  Final  Culture, blood (routine x 2)     Status: None   Collection Time: 02/12/15  5:40 PM  Result Value Ref Range Status   Specimen Description LEFT ANTECUBITAL  Final   Special Requests BOTTLES DRAWN AEROBIC AND ANAEROBIC 10CC  Final   Culture   Final    NO GROWTH 5 DAYS Performed at [facility]    Report Status 02/17/2015 FINAL  Final  Culture, Urine     Status: None   Collection Time: 02/12/15  6:45 PM  Result Value Ref Range Status   Specimen Description URINE, CATHETERIZED  Final    Special Requests Immunocompromised  Final   Culture   Final    NO GROWTH 1 DAY Performed at [facility]    Report Status 02/14/2015 FINAL  Final  Culture, blood (routine x 2) Call MD if unable to obtain prior to antibiotics being given     Status: None   Collection Time: 02/15/15  1:15 PM  Result Value Ref Range Status   Specimen Description BLOOD LEFT ARM  Final   Special Requests BOTTLES DRAWN AEROBIC AND ANAEROBIC 10CC  Final   Culture   Final    NO GROWTH 5 DAYS Performed at [facility]    Report Status 02/20/2015 FINAL  Final  Culture, blood (routine x 2) Call MD if unable to obtain prior to antibiotics being given     Status: None   Collection Time: 02/15/15  1:20 PM  Result Value Ref Range Status   Specimen Description BLOOD RIGHT ARM  Final   Special Requests BOTTLES DRAWN AEROBIC AND ANAEROBIC 10CC  Final   Culture   Final    NO GROWTH 5 DAYS Performed at [facility]  [facility]    Report Status 02/20/2015 FINAL  Final     Studies: Dg Chest Port 1 View  02/20/2015   CLINICAL DATA:  Acute hypoxia.  EXAM: PORTABLE CHEST - 1 VIEW  COMPARISON:  02/19/2015 and prior radiographs  FINDINGS: Mild cardiomegaly again noted.  A small left pleural effusion and left lower lung consolidation/atelectasis again noted.  Interstitial opacities, left-greater-than-right, are again noted and may represent edema.  There is no evidence of pneumothorax.  There has been little interval change since the prior study.  IMPRESSION: Little interval change with continued small left pleural effusion, left lower lung consolidation/ atelectasis and question interstitial edema.   Electronically Signed   By: Margarette Canada M.D.   On: 02/20/2015 08:05    Scheduled Meds:  Scheduled Meds: . sodium chloride   Intravenous Once  . digoxin  0.125 mg Oral Daily  . feeding supplement (ENSURE ENLIVE)  237 mL Oral TID BM  . levofloxacin (LEVAQUIN) IV  750 mg Intravenous Q48H  . pantoprazole (PROTONIX) IV   40 mg Intravenous Q12H   Continuous Infusions:    Time spent on care of this patient: 40 min   [place], MD 02/21/2015, 10:48 AM  LOS: 12 days   [facility] Office  [phone] Pager - Text Page per [email or web address] If 7PM-7AM, please contact night-coverage [email or web address]

## 2015-02-21 NOTE — Care Management Important Message (Signed)
Important Message  Patient Details  Name: [name] MRN: [id number] Date of Birth: Nov 22, 1922   Medicare Important Message Given:  Yes-second notification given    Shelda Altes 02/21/2015, 4:09 [place] Message  Patient Details  Name: [name] MRN: [id number] Date of Birth: [birth year]   Medicare Important Message Given:  Yes-second notification given    Shelda Altes 02/21/2015, 4:09 PM

## 2015-02-21 NOTE — Progress Notes (Signed)
CSW assisting with d/c planning. Pt / family had planned for d/c to [facility] for [place] when stable. CSW met with pt / niece to offer support and assist with d/c planning. Palliative Care Team has been consulted for recommendations. SNF has been updated and clinicals sent, though disposition is unclear at this time. Insurance will cover ST Rehab at SNF, not comfort care. Family is aware of this. CSW will continue to follow.   Werner Lean LCSW [phone]

## 2015-02-21 NOTE — Consult Note (Signed)
Consultation Note Date: 02/21/2015   Patient Name: [name]  DOB: 1923/02/27  MRN: [id number]  Age / Sex: 81 y.o., female   PCP: [name], MD Referring Physician: Debbe Odea, MD  Reason for Consultation: Disposition, Establishing goals of care and Psychosocial/spiritual support  Palliative Care Assessment and Plan Summary of Established Goals of Care and Medical Treatment Preferences    Palliative Care Discussion Held Today:     This NP Wadie Lessen reviewed medical records, received report from team, assessed the patient and then meet at the patient's bedside along with her sister Rod Holler and niece Benjamine Mola  to discuss diagnosis, prognosis, GOC, EOL wishes disposition and options.  A detailed discussion was had today regarding advanced directives.  Concepts specific to code status, artifical feeding and hydration, continued IV antibiotics and rehospitalization was had.  The difference between a aggressive medical intervention path  and a palliative comfort care path for this patient at this time was had.  Values and goals of care important to patient and family were attempted to be elicited.  Concept of Hospice and Palliative Care were discussed  Natural trajectory and expectations at EOL were discussed.  Questions and concerns addressed.  Hard Choices booklet left for review. Family encouraged to call with questions or concerns.  PMT will continue to support holistically.  MOST form was introduced  Tax adviser: none documented, family work together,  Niece Benjamine Mola is spokes person for family at this time   Goals of Care/Code Status/Advance Care Planning:   Code Status: DNR/DNI Family need time to process current situation and make decisions regarding plan of care and disposition options.   Re-meet tomorrow at 3pm to clarify GOC   Psycho-social/Spiritual:   Support System: Family  Desire for further Chaplaincy support:no-declines visit  Prognosis:    Prognosis dependant on decision regarding aggressive medical intervetnions  Discharge Planning:  Pending outcomes.  Patient had been living at home with assistance from her family, if need be they can be there 24/7        Chief Complaint:  Weakness, constipation  History of Present Illness:    81 y.o. female with A. fib not on anticoagulation due to memory issues, a blood disorder with an elevated wbc count , Sweet syndrome, interstitial lung disease who presented to the hospital with constipation for 1 week and lower abdominal pain. CT scan of the abdomen and pelvis was suspicious for rectal stricture. Rectal exam showed rectal impaction and disimpaction was done by the ER doctor, Dr. Darl Householder. Stool was heme positive.   She was noted to have A. fib with RVR and was started on Cardizem infusion. [facility] was consulted by the ER and per request, she was admitted to the hospitalist group. She was started on Laxatives to help prep her for sigmoidoscopy but she was quite agitated overnight and did not take the prep. Despite being given enemas, she had minimal stool output. She underwent a sigmoidoscopy on 7/25. Initial exam prior to this procedure revealed no abnormalities in the rectum. Scope was advanced up to the splenic flexure and no lesions and no stricture was noted.  On the evening after the sigmoidoscopy on th 25th, the patient developed a severe congested cough. The family notes that she had a mild cough prior to admission but it was definitely much worse on that evening. A chest x-ray was done on the morning of the 26th and it revealed a patchy sided infiltrate. Treatment for pneumonia was begun with ceftaz and  vancomycin.  Seen by oncology, noted underlying bone marrow disorder, likely  myelodysplasia. She has a myeloproliferative component because of the elevated white cells. Oncology recommending comfort approach ( is not a candidate for any intervention with respect to her blood problem.  )  Faced with advanced directive decisions and anticipatory care needs.   Primary Diagnoses  Present on Admission:  . (Resolved) UTI (lower urinary tract infection) . Atrial fibrillation . Chronic diastolic heart failure . Constipation - functional . (Resolved) Chronic myelocytic leukemia . Acute neutrophilic dermatosis . Microcytic anemia . Asymptomatic bacteriuria . Chronic atrial fibrillation . Atrial fibrillation with RVR . Leukocytosis . (Resolved) Rectal stricture  Palliative Review of Systems:    - "alittle tired", denies pain or SOB    I have reviewed the medical record, interviewed the patient and family, and examined the patient. The following aspects are pertinent.  Past Medical History  Diagnosis Date  . Atrial fib/flutter, transient     Coumadin; status post TEE guided cardioversion 6/12  . Interstitial lung disease 12/17/10    CT scan  of chest, showed old compression fracture T6.  . Microcytic anemia     Iron deficient  . Thrombocytosis   . Hyponatremia   . history of Pneumonia 12/11/10  . Chronic diastolic heart failure     Echo 5/12: EF 60-65%, mild MR, mild BAE  . Chronic myelocytic leukemia     Dr. Ralene Ok  . Acute neutrophilic dermatosis     Sweet syndrome-prednisone therapy; [facility] dermatology   History   Social History  . Marital Status: Single    Spouse Name: N/A  . Number of Children: 0  . Years of Education: N/A   Occupational History  . RETIRED     Social History Main Topics  . Smoking status: Never Smoker   . Smokeless tobacco: Never Used  . Alcohol Use: No  . Drug Use: No  . Sexual Activity: Not on file   Other Topics Concern  . None   Social History Narrative   Prior used to be music and Conservation officer, nature   Independent of ADLs and IADLs   Last control 2014-has poor vision so stopped   Next of kin is his niece      Family History  Problem Relation Age of Onset  . Heart disease Mother 81  . Heart attack Father  26    died of heart attack.  . Leukemia    . Cancer - Lung     Scheduled Meds: . sodium chloride   Intravenous Once  . digoxin  0.125 mg Oral Daily  . feeding supplement (ENSURE ENLIVE)  237 mL Oral TID BM  . levofloxacin (LEVAQUIN) IV  750 mg Intravenous Q48H  . pantoprazole (PROTONIX) IV  40 mg Intravenous Q12H   Continuous Infusions:  PRN Meds:.acetaminophen, camphor-menthol, lip balm, ondansetron (ZOFRAN) IV, polyvinyl alcohol Medications Prior to Admission:  Prior to Admission medications   Medication Sig Start Date End Date Taking? Authorizing Provider  Sennosides (EX-LAX PO) Take 2 tablets by mouth daily as needed (constipation).   Yes Historical Provider, MD   No Known Allergies CBC:    Component Value Date/Time   WBC 77.2* 02/21/2015 0620   HGB 8.4* 02/21/2015 0620   HCT 25.7* 02/21/2015 0620   PLT 459* 02/21/2015 0620   MCV 84.5 02/21/2015 0620   NEUTROABS 42.3* 02/15/2015 1130   LYMPHSABS 3.5 02/15/2015 1130   MONOABS 1.5* 02/15/2015 1130   EOSABS 2.0* 02/15/2015 1130   BASOSABS  0.0 02/15/2015 1130   Comprehensive Metabolic Panel:    Component Value Date/Time   NA 135 02/21/2015 0620   K 4.1 02/21/2015 0620   CL 105 02/21/2015 0620   CO2 24 02/21/2015 0620   BUN 49* 02/21/2015 0620   CREATININE 0.78 02/21/2015 0620   GLUCOSE 98 02/21/2015 0620   CALCIUM 7.7* 02/21/2015 0620   AST 29 02/14/2015 0515   ALT 12* 02/14/2015 0515   ALKPHOS 60 02/14/2015 0515   BILITOT 0.8 02/14/2015 0515   PROT 4.7* 02/14/2015 0515   ALBUMIN 2.2* 02/14/2015 0515    Physical Exam:  Vital Signs: BP 102/40 mmHg  Pulse 91  Temp(Src) 97.6 F (36.4 C) (Oral)  Resp 21  Ht 5\' 3"  (1.6 m)  Wt 58 kg (127 lb 13.9 oz)  BMI 22.66 kg/m2  SpO2 96% SpO2: SpO2: 96 % O2 Device: O2 Device: Nasal Cannula O2 Flow Rate: O2 Flow Rate (L/min): 2 L/min Intake/output summary:  Intake/Output Summary (Last 24 hours) at 02/21/15 1151 Last data filed at 02/21/15 0800  Gross per 24 hour    Intake    725 ml  Output    835 ml  Net   -110 ml   LBM: Last BM Date: 02/20/15 Baseline Weight: Weight: 51.1 kg (112 lb 10.5 oz) Most recent weight: Weight: 58 kg (127 lb 13.9 oz)  Exam Findings:   General: chronically ill appearing, pale and weak HEENT: moist buccal membranes, no exudate noted CVS: RRR Resp: CTA Neuro: oriented X2, intermittently confused to situation and poor insight into medical            Palliative Performance Scale: 30 % at best                Additional Data Reviewed: Recent Labs     02/20/15  0358   02/21/15  0015  02/21/15  0620  WBC  77.8*   < >  82.5*  77.2*  HGB  8.2*   < >  6.9*  8.4*  PLT  492*   < >  488*  459*  NA  139   --    --   135  BUN  62*   --    --   49*  CREATININE  0.95   --    --   0.78   < > = values in this interval not displayed.     Time In: 1100 Time Out: 1230 Time Total: 90 min  Greater than 50%  of this time was spent counseling and coordinating care related to the above assessment and plan.    Signed by: Wadie Lessen, NP  Knox Royalty, NP  02/21/2015, 11:51 AM  Please contact Palliative Medicine Team phone at [phone] for questions and concerns.   See AMION for contact information

## 2015-02-21 NOTE — Progress Notes (Signed)
PT Cancellation Note  Patient Details Name: [name] MRN: [id number] DOB: 08/18/1922   Cancelled Treatment:    Reason Eval/Treat Not Completed: Medical issues which prohibited therapy (low HGB)   [name] 02/21/2015, 7:20 AM [name] PT [phone]

## 2015-02-21 NOTE — Progress Notes (Signed)
Date:  February 21, 2015 U.R. performed for needs and level of care. Will continue to follow for Case Management needs.  Dowell Hoon, RN, BSN, CCM   [phone] 

## 2015-02-21 NOTE — Progress Notes (Signed)
CRITICAL VALUE ALERT  Critical value received:  WBC 82.5, Hgb 6.9  Date of notification:  02/21/15  Time of notification:  0039  Critical value read back:Yes.    Nurse who received alert:  Jari Favre RN  MD notified (1st page):  Fredirick Maudlin, NP  Time of first page:  [phone]  MD notified (2nd page):  Time of second page:  Responding MD:  Fredirick Maudlin, NP  Time MD responded:  [phone], new order received

## 2015-02-21 NOTE — Progress Notes (Signed)
[name] was moved on to the ICU over the weekend. Apparently, her blood count dropped with her hemoglobin. On the 27th, her hemoglobin was low 0.5. On the 30th was down to 7.5. It then dropped even lower.  She had a reticulocyte count done. This was actually better than I thought it would be. She has not been on anticoagulation. When I saw her on Friday, I will get her blood smear and her red cells looked okay. I do not see any nucleated red cells. She had an increase in white cells but I really do not see any blasts.  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 We did send off studies for chronic leukemia.  Her niece was there today. It is apparent that [name] needs palliative care and hospice. She is a DO NOT RESUSCITATE. I think this is very appropriate. She is not a candidate for any type of intervention.  She feels pretty good this morning. She has been eating a little bit better.  Her blood count today shows a white cell count 77. Hemoglobin 8.4. Platelet count 459K.    Her vital signs for her physical exam showed temperature of 97.1. Pulse 89. Blood pressure 102/39. Her lungs sound relatively clear. Cardiac exam irregular rate and rhythm. Abdomen is soft. There is no splenomegaly. She has no pedal megaly. Extremities shows some muscle x-ray and upper lower extremities.  Again, [name] has a underlying bone marrow disorder. I would have to believe this is myelodysplasia. She has a myeloproliferative component because of the elevated white cells.  Comfort care really should be the goal. I'm sure her family would agree with this. I just cannot see doing a lot of testing on her. She is not a candidate for any intervention with respect to her blood problem. Transfusion support would be reasonable but I would be very judicious in transfusing her.  I appreciate all the great care that she is getting from everybody down in the ICU!!!!!  [place] 2:17

## 2015-02-22 LAB — BASIC METABOLIC PANEL
Anion gap: 6 (ref 5–15)
BUN: 36 mg/dL — ABNORMAL HIGH (ref 6–20)
CHLORIDE: 106 mmol/L (ref 101–111)
CO2: 24 mmol/L (ref 22–32)
CREATININE: 0.72 mg/dL (ref 0.44–1.00)
Calcium: 7.8 mg/dL — ABNORMAL LOW (ref 8.9–10.3)
GFR calc Af Amer: >60 mL/min (ref >60)
GFR calc non Af Amer: >60 mL/min (ref >60)
Glucose, Bld: 101 mg/dL — ABNORMAL HIGH (ref 65–99)
Potassium: 4.1 mmol/L (ref 3.5–5.1)
SODIUM: 136 mmol/L (ref 135–145)

## 2015-02-22 LAB — GLUCOSE, CAPILLARY
GLUCOSE-CAPILLARY: 99 mg/dL (ref 65–99)
Glucose-Capillary: 111 mg/dL — ABNORMAL HIGH (ref 65–99)

## 2015-02-22 LAB — CBC
HCT: 25.2 % — ABNORMAL LOW (ref 36.0–46.0)
Hemoglobin: 8.1 g/dL — ABNORMAL LOW (ref 12.0–15.0)
MCH: 27.8 pg (ref 26.0–34.0)
MCHC: 32.1 g/dL (ref 30.0–36.0)
MCV: 86.6 fL (ref 78.0–100.0)
Platelets: 499 10*3/uL — ABNORMAL HIGH (ref 150–400)
RBC: 2.91 MIL/uL — AB (ref 3.87–5.11)
RDW: 18.5 % — ABNORMAL HIGH (ref 11.5–15.5)
WBC: 67 10*3/uL (ref 4.0–10.5)

## 2015-02-22 LAB — JAK2 GENOTYPR

## 2015-02-22 LAB — PREPARE RBC (CROSSMATCH)

## 2015-02-22 MED ORDER — DILTIAZEM HCL ER 60 MG PO CP12
60.0000 mg | ORAL_CAPSULE | Freq: Two times a day (BID) | ORAL | Status: DC
  Administered 2015-02-22 – 2015-02-23 (×2): 60 mg via ORAL
  Filled 2015-02-22 (×4): qty 1

## 2015-02-22 MED ORDER — SODIUM CHLORIDE 0.9 % IV SOLN
Freq: Once | INTRAVENOUS | Status: AC
  Administered 2015-02-22: 10:00:00 via INTRAVENOUS

## 2015-02-22 NOTE — Progress Notes (Signed)
PT Cancellation Note  Patient Details Name: [name] MRN: [id number] DOB: 1922/11/24   Cancelled Treatment:    Reason Eval/Treat Not Completed: Other (comment) (in AM had been OOB by nursing, this PM, Palliative care mtg .)   [name] 02/22/2015, 4:36 PM

## 2015-02-22 NOTE — Progress Notes (Signed)
[name] is doing well this morning. She had 2 units of blood yesterday. Her hemoglobin is 13. Her white cell count and platelet count continue to come down slowly.  I probably wouldn't stop antibiotics. I don't see any obvious cultures that are positive.  I think she also can have her diet liberalized. Since she is DO NOT RESUSCITATE, I think whatever she wants to eat would be very appropriate.  Her niece was with her this morning. It sounds like she might be going to a rehabilitation facility. I'm not sure which that would be. Whichever one, please let me know so I can make sure we give her follow-up in our office.  She's not obviously bleeding.  She is having no problems with pain. There is no cough. She has no shortness of breath.  Overall, her performance status is ECOG 3-4.  On her physical exam, her vital signs are all relatively stable. Her temperature 97.4. Blood pressure 120/61. Pulse 69. Her lungs are relatively clear bilaterally. Cardiac exam regular rate and rhythm with occasional extra beat. Abdomen is soft. There is no palpable liver or spleen tip. Extremities shows no clubbing, cyanosis or edema.  Again, [name] and, in my opinion, has this "hybrid" myelodysplastic/myeloproliferative disorder. I did send off genetic studies on the peripheral blood. I also sent off flow cytometry on her peripheral blood. These have not yet returned. I think it might be worthwhile checking an erythropoietin level on her.  At some point, I think blood transfusions might be futile. For now, I think they would be reasonable.  I spoke to the niece. She has a good understanding of what is going on. She is very appreciative of the great care that her aunt has received in the ICU.  Lum Keas  1 Peter 4:7

## 2015-02-22 NOTE — Progress Notes (Signed)
Daily Progress Note   Patient Name: [name]       Date: 02/22/2015 DOB: 09-22-1922  Age: 81 y.o. MRN#: [id number] Attending Physician: Debbe Odea, MD Primary Care Physician: [name], MD Admit Date: 02/09/2015  Reason for Consultation/Follow-up: Disposition, Establishing goals of care and Psychosocial/spiritual support  Subjective:     -continued conversation regarding diagnosis, prognosis, goals of care, options and anticipatory care needs at bedside with patient's sister and niece  -outlined SNF with rehabilitation, hospice at home, inpatient hospice facility for comparison  -discussed natural trajectory at end of life and concept of mortality  -detailed an aggressive medical intervention path vs a comfort path for this patient at this time in this situatuion  -questions and concerns addressed     Length of Stay: 13 days  Current Medications: Scheduled Meds:  . digoxin  0.125 mg Oral Daily  . diltiazem  30 mg Oral 4 times per day  . feeding supplement (ENSURE ENLIVE)  237 mL Oral TID BM  . levofloxacin (LEVAQUIN) IV  750 mg Intravenous Q48H  . pantoprazole (PROTONIX) IV  40 mg Intravenous Q12H    Continuous Infusions:    PRN Meds: acetaminophen, camphor-menthol, chlorpheniramine-HYDROcodone, lip balm, ondansetron (ZOFRAN) IV, polyvinyl alcohol  Palliative Performance Scale: 30 % at best     Vital Signs: BP 116/34 mmHg  Pulse 67  Temp(Src) 97.3 F (36.3 C) (Axillary)  Resp 19  Ht 5\' 3"  (1.6 m)  Wt 58.7 kg (129 lb 6.6 oz)  BMI 22.93 kg/m2  SpO2 93% SpO2: SpO2: 93 % O2 Device: O2 Device: Nasal Cannula O2 Flow Rate: O2 Flow Rate (L/min): 2 L/min  Intake/output summary:  Intake/Output Summary (Last 24 hours) at 02/22/15 1454 Last data filed at 02/22/15 1300  Gross per 24 hour  Intake    230 ml  Output    720 ml  Net   -490 ml   LBM: Last BM Date: 03/23/15 Baseline Weight: Weight: 51.1 kg (112 lb 10.5 oz) Most recent weight: Weight: 58.7 kg  (129 lb 6.6 oz)  Physical Exam:           General: chronically ill appearing, pale and weak HEENT: moist buccal membranes, no exudate noted CVS: RRR Resp: CTA Neuro: oriented X2, intermittently confused to situation and poor insight into medical    Additional Data Reviewed: Recent Labs     02/21/15  0620  02/22/15  0330  WBC  77.2*  67.0*  HGB  8.4*  8.1*  PLT  459*  499*  NA  135  136  BUN  49*  36*  CREATININE  0.78  0.72     Problem List:  Patient Active Problem List   Diagnosis Date Noted  . Palliative care encounter 02/21/2015  . DNR (do not resuscitate) 02/21/2015  . HCAP (healthcare-associated pneumonia)   . Bloody stool   . Acute blood loss anemia   . Weakness generalized   . Myelodysplastic syndrome 02/19/2015  . PNA (pneumonia)   . Atrial fibrillation with RVR   . Leukocytosis   . Chronic atrial fibrillation   . Constipation - functional 02/09/2015  . Microcytic anemia 02/09/2015  . Asymptomatic bacteriuria 02/09/2015  . New onset a-fib 02/09/2015  . Chronic rhinitis 10/05/2011  . Long term (current) use of anticoagulants 01/09/2011  . Atrial fibrillation   . Acute neutrophilic dermatosis   . Chronic diastolic heart failure   . Interstitial lung disease 12/17/2010     Palliative Care Assessment & Plan  Code Status:  DNR  Goals of Care:         - Hopeful for continued quality time.  At this time, treat the treatable.  Family is open to continued transfusions as indicated, IV fluids, antibiotic and rehospitalization.   Symptom Management:  Weakness-continue PT/OT and SNF for rehabilitation on discharge     Discharge Planning: [facility] for rehab with Palliative care service follow-up.  Discussed with Roselyn Reef and [facility] has a bed for patient tomorrow if medically stable.   Care plan was discussed with Dr Wynelle Cleveland  Thank you for allowing the Palliative Medicine Team to assist in the care of this patient.   Time In:  1500 Time Out: 1600 Total Time  60 min Prolonged Time Billed  no     Greater than 50%  of this time was spent counseling and coordinating care related to the above assessment and plan.  Knox Royalty, NP  02/22/2015, 2:54 PM  Please contact Palliative Medicine Team phone at [phone] for questions and concerns.

## 2015-02-22 NOTE — Progress Notes (Signed)
[name] is about the same. Unfortunately, her blood count is dropping again. Her hemoglobin is down to 8.1. There is no obvious bleeding. Her white cell count and platelet count are about the same.  I just think that she has a dysfunctional bone marrow.  I think that she probably will need 2 pints of blood. I then this would help.  I am going to set her peripheral blood off for flow cytometry to see if there is any evidence of leukemic transformation. It is hard to say how much she is eating. She is still quite frail. She is not hurting. She's having no obvious nausea or vomiting.  Her metabolic panel looks fairly normal.  On her physical exam, her vital signs are pretty stable. Her pulse is only 67. Temperature 97.3. Blood pressure is 105/29. Lungs are with some decreased breath sounds at the bases. Cardiac exam regular rate and rhythm. Abdomen soft. No palpable liver or spleen. Extremities shows no edema in her legs.  We will go ahead and give her 2 units of blood. At her age, I think that this will help her out.  As always, we prayed together.. She has a good strong faith.  Laurey Arrow

## 2015-02-22 NOTE — Progress Notes (Signed)
[facility] Progress Note   Vesper Trant TGY:[id number] DOB: 10-Dec-1922 DOA: 02/09/2015 PCP: [name], MD  Brief narrative: [name] is a 81 y.o. female with A. 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 CT scan of the abdomen and pelvis was suspicious for rectal stricture. Rectal exam showed rectal impaction and disimpaction was done by the ER doctor, Dr. Darl Householder. Stool was heme positive.   She was noted to have A. fib with RVR and was started on Cardizem infusion. [facility] was consulted by the ER and per request, she was admitted to the hospitalist group. She was started on Laxatives to help prep her for sigmoidoscopy but she was quite agitated overnight and did not take the prep. Despite being given enemas, she had minimal stool output. She underwent a sigmoidoscopy on 7/25. Initial exam prior to this procedure revealed no abnormalities in the rectum. Scope was advanced up to the splenic flexure and no lesions and no stricture was noted.  Did have urinary retention on 7/23-Foley catheter placed - 1000 cc of urine emptied. Started on empiric Rocephin on 7/24  - UA was noted to be positive on admission  On the evening after the sigmoidoscopy on th 25th, the patient developed a severe congested cough. The family notes that she had a mild cough prior to admission but it was definitely much worse on that evening. A chest x-ray was done on the morning of the 26th and it revealed a patchy sided infiltrate. Treatment for pneumonia was begun with ceftaz and vancomycin.  On 7/30, she was noted to have dropped her Hb by 3 gm from the day before- she later started having maroon stools.    Subjective: Sleepy but awake of both. Has no complaints. Daughter states that she's been  eating and tolerating full liquids.  Assessment/Plan: Principal Problem:   Atrial fibrillation with RVR -This improved with Cardizem infusion- she was switched to oral diltiazem   -Digoxin was continued - also on Iv Lopressor 2.5 mg QID- switched to oral Lopressor 25 mg BID on 7/27 - Recommended by cardiology on 7/23 to hold off on anticoagulation due to her advanced age, positive Hemoccults and mental status- they recommended to start aspirin only - due to Gi bleeding, and hypotension, held Lopressor and Cardizem - Resumed Cardizem infusion and given a load of IV digoxin on 7/31 for A. fib with RVR and heart rate came under control - due to hypotension, Cardizem infusion was discontinued on the evening of 7/31- - per cardiology, not a candidate for amiodarone due to ILD - hold off ALL anticoagulation from here on - Currently on digoxin and low-dose short-acting Cardizem which is being held if blood pressure is too low-will change to Cardizem SR 60 mg and give 1 dose tonight- if she is stable on this dose, it can ordered again tomorrow morning   Hypotension - BP dropped to 28J systolic due to above bleed-currently still in the 90s to low 100s range- holding off on hydration as still slightly fluid overloaded  Pneumonia -Left-sided infiltrate-doubtful this is aspiration  - according to the family, symptoms started prior to admission and got worse during hospitalization therefore this may be community-acquired pneumonia - WBC also noted to be rising from her baseline which is usually in the 20s - 7/27switched vand  and Tressie Ellis to Levaquin to provide atypical coverage-  - not hypoxic- cough is the main complaint -Blood cultures from 7/23 and 7/26 are negative -MRSA PCR screen negative - Vanc discontinued  - although WBC count worse, symptoms of cough better- CXR  reveals b/l edema and no focal infiltrate - cont Levaquin -  will DC today -7 day course completed-cough nearly resolved  Acute  pulm edema/ acute diastolic CHF - Lasix being given daily since 7/27-  Held due to hypotension/ bleeding - she still has pulm edema but no resp distress- start incentive spirometry and mobilize out of bed to reduce atelectasis   Acute lower GI bleed/ Acute blood loss anemia - was hemoccult positive when checked in the ER - Hb noted to drop from 11 to 7 this AM- reason initally was uncertain but then maroon stools started later this AM- has had 2 large ones so far - have stopped ASA and Lovenox - she does not like to follow up with doctors and family is not aware if she has ever had a colonoscopy - had a flex sig earlier in the admission as rectal stricture was seen on CT-  no significant findings  - received 2 UPRBC on 7/30 -last bloody stool at 3 AM on 7/31-required 1 more unit of packed red blood cells as hemoglobin drifted down to 6.9-hemoglobin up to 8.4 this morning-  - placed on BID PPI- hold off on GI procedures for now - I recommend a conservative approach- neice agrees -she had a small black stool in the middle of the night-Continue full liquids today-advanced to solid food tomorrow if stable   Constipation - resolved with laxatives - based upon CT report, there was a concern for a rectal stricture  -7/25 no evidence of rectal stricture (noted on CT) on sigmoidoscopy    UTI? -UA positive - urine culture from 7/23 negative    Urinary retention -Removed Foley and gave a voiding trial - she is noted to be voiding appropriately  Hospital induced delirium / Dementia -  probable delirium secondary to a 1st generation antihistamine benadryl [had a lot of itching] and being in the hospital - she has mild forgetfulness at baseline but per her sister, it gets much worse and "she has a different personality" when she is in the hospital -    Acute neutrophilic dermatosis(Sweet syndrome)  - Itching/rash (per the meatus patches throughout her body) - apparently was given Benadryl earlier  in the admission which caused increased confusion - Rash has improved significantly during the hospital stay  Myelodysplasia - as WBC count continues to rise, have consulted Oncology  - she refused to see doctors as outpt and Dr Marin Olp last saw her (only saw her once) on 2012- per Dr Marin Olp, the [facility] diagosis is inaccurate- she likely had myelodysplasia- he is doing further work up via blood work - he does not want to put her through a bone marrow biopsy due to her frailty - his note states that 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 He recommends palliative/ hospice approach with I agree with as well -however, as she will be going to skilled nursing facility from here and therefore cannot be followed by hospice or palliative care while she is there-depending on how she does, family would consider hospice needs to be she did   Disposition: patient did not have a PCP and was not taking medications prior to admission. - I had a long discussion with the niece  a few days ago. She will ensure patient goes to see a PCP (we will make the appt with PCP mentioned at top of note) and continue to take the medications we prescribe. For now she will be going to a SNF - consulted palliative care today as well.    Appt with PCP: requested Code Status: DNR- discussed in detail with sister and neices Family Communication: with nieces and sister daily Disposition Plan: SNF when stable DVT prophylaxis: SCDs Consultants:cardiology, GI, hematology, palliative  Procedures: Flex Sig  Antibiotics: Anti-infectives    Start     Dose/Rate Route Frequency Ordered Stop   02/16/15 1800  levofloxacin (LEVAQUIN) IVPB 750 mg     750 mg 100 mL/hr over 90 Minutes Intravenous Every 48 hours 02/16/15 1747     02/16/15 1400  vancomycin (VANCOCIN) IVPB 750 mg/150 ml premix  Status:  Discontinued     750 mg 150 mL/hr over 60 Minutes Intravenous Every 24 hours 02/15/15 1242 02/16/15 1747    02/15/15 1430  vancomycin (VANCOCIN) 1,250 mg in sodium chloride 0.9 % 250 mL IVPB     1,250 mg 166.7 mL/hr over 90 Minutes Intravenous  Once 02/15/15 1242 02/15/15 1600   02/15/15 1400  cefTAZidime (FORTAZ) 1 g in dextrose 5 % 50 mL IVPB  Status:  Discontinued     1 g 100 mL/hr over 30 Minutes Intravenous Every 12 hours 02/15/15 1222 02/16/15 1747   02/13/15 0630  cefTRIAXone (ROCEPHIN) 1 g in dextrose 5 % 50 mL IVPB  Status:  Discontinued     1 g 100 mL/hr over 30 Minutes Intravenous Daily 02/13/15 0619 02/15/15 1222   02/09/15 1600  ciprofloxacin (CIPRO) IVPB 400 mg     400 mg 200 mL/hr over 60 Minutes Intravenous  Once 02/09/15 1550 02/09/15 1712   02/09/15 1600  metroNIDAZOLE (FLAGYL) IVPB 500 mg  Status:  Discontinued     500 mg 100 mL/hr over 60 Minutes Intravenous  Once 02/09/15 1550 02/09/15 1804      Objective: Filed Weights   02/20/15 1800 02/21/15 0600 02/22/15 0500  Weight: 57.4 kg (126 lb 8.7 oz) 58 kg (127 lb 13.9 oz) 58.7 kg (129 lb 6.6 oz)    Intake/Output Summary (Last 24 hours) at 02/22/15 1756 Last data filed at 02/22/15 1500  Gross per 24 hour  Intake    550 ml  Output    665 ml  Net   -115 ml     Vitals Filed Vitals:   02/22/15 1600 02/22/15 1603 02/22/15 1618 02/22/15 1700  BP: 116/36  128/34 112/48  Pulse: 81 73 67 72  Temp: 98.2 F (36.8 C)  97.8 F (36.6 C)   TempSrc: Oral  Axillary   Resp: 18 20 19 15   Height:      Weight:      SpO2: 91% 91% 93% 93%    Exam:  General:  Pt is alert, not in acute distress  HEENT: No icterus, No thrush, oral mucosa moist  Cardiovascular: IIRR, HR 100-130s at rest,  S1/S2 No murmur  Respiratory: clear to auscultation bilaterally - poor air entry- pulse ox 93% on 2L  Abdomen: Soft, +Bowel sounds, non tender, non distended, no guarding  MSK: No LE edema, cyanosis or clubbing  Skin: erythematous patchs covering most of her body   Data Reviewed: Basic Metabolic Panel:  Recent Labs Lab  02/18/15 0835 02/19/15 0555 02/20/15 0358 02/21/15 0620 02/22/15 0330  NA 133* 136 139 135 136  K 3.9 3.9  4.2 4.1 4.1  CL 104 103 106 105 106  CO2 21* 26 24 24 24   GLUCOSE 96 96 110* 98 101*  BUN 32* 37* 62* 49* 36*  CREATININE 0.82 0.86 0.95 0.78 0.72  CALCIUM 7.9* 7.8* 7.8* 7.7* 7.8*   Liver Function Tests: No results for input(s): AST, ALT, ALKPHOS, BILITOT, PROT, ALBUMIN in the last 168 hours. No results for input(s): LIPASE, AMYLASE in the last 168 hours. No results for input(s): AMMONIA in the last 168 hours. CBC:  Recent Labs Lab 02/20/15 1145 02/20/15 1812 02/21/15 0015 02/21/15 0620 02/22/15 0330  WBC 76.0* 84.0* 82.5* 77.2* 67.0*  HGB 7.7* 7.8* 6.9* 8.4* 8.1*  HCT 23.6* 23.6* 21.0* 25.7* 25.2*  MCV 82.8 83.7 84.0 84.5 86.6  PLT 476* 512* 488* 459* 499*   Cardiac Enzymes: No results for input(s): CKTOTAL, CKMB, CKMBINDEX, TROPONINI in the last 168 hours. BNP (last 3 results)  Recent Labs  02/18/15 0835  BNP 191.5*    ProBNP (last 3 results) No results for input(s): PROBNP in the last 8760 hours.  CBG:  Recent Labs Lab 02/22/15 0743 02/22/15 1120  GLUCAP 99 111*    Recent Results (from the past 240 hour(s))  Culture, Urine     Status: None   Collection Time: 02/12/15  6:45 PM  Result Value Ref Range Status   Specimen Description URINE, CATHETERIZED  Final   Special Requests Immunocompromised  Final   Culture   Final    NO GROWTH 1 DAY Performed at [facility]    Report Status 02/14/2015 FINAL  Final  Culture, blood (routine x 2) Call MD if unable to obtain prior to antibiotics being given     Status: None   Collection Time: 02/15/15  1:15 PM  Result Value Ref Range Status   Specimen Description BLOOD LEFT ARM  Final   Special Requests BOTTLES DRAWN AEROBIC AND ANAEROBIC 10CC  Final   Culture   Final    NO GROWTH 5 DAYS Performed at [facility]    Report Status 02/20/2015 FINAL  Final  Culture, blood (routine x 2)  Call MD if unable to obtain prior to antibiotics being given     Status: None   Collection Time: 02/15/15  1:20 PM  Result Value Ref Range Status   Specimen Description BLOOD RIGHT ARM  Final   Special Requests BOTTLES DRAWN AEROBIC AND ANAEROBIC 10CC  Final   Culture   Final    NO GROWTH 5 DAYS Performed at [facility]    Report Status 02/20/2015 FINAL  Final     Studies: No results found.  Scheduled Meds:  Scheduled Meds: . digoxin  0.125 mg Oral Daily  . diltiazem  30 mg Oral 4 times per day  . feeding supplement (ENSURE ENLIVE)  237 mL Oral TID BM  . levofloxacin (LEVAQUIN) IV  750 mg Intravenous Q48H  . pantoprazole (PROTONIX) IV  40 mg Intravenous Q12H   Continuous Infusions:    Time spent on care of this patient: 40 min   [place], MD 02/22/2015, 5:56 PM  LOS: 13 days   [facility] Office  [phone] Pager - Text Page per [email or web address] If 7PM-7AM, please contact night-coverage [email or web address]

## 2015-02-22 NOTE — Progress Notes (Signed)
Speech Language Pathology Treatment: Dysphagia  Patient Details Name: [name] MRN: [id number] DOB: 1923/06/28 Today's Date: 02/22/2015 Time: 2778-2423 SLP Time Calculation (min) (ACUTE ONLY): 11 min  Assessment / Plan / Recommendation Clinical Impression  Brief f/u after recent swallow assessment.  Pt is tolerating POs without observed difficulty per niece.  Consumed large thin liquid boluses with no s/s of aspiration noted by clinician.  Declined solids. Airway protection improved now that MS is better.  Recommend continue dysphagia 3, thin liquids - SLP to sign off.    HPI Other Pertinent Information: 81 yo female admittee with A fib with RVR. hx of A fib, A flutter,, HF, interstitial lung disease, orthostatic hypotension, CML, anemia. Pt is from home alone.    Pertinent Vitals Pain Assessment: No/denies pain  SLP Plan  All goals met    Recommendations Diet recommendations: Dysphagia 3 (mechanical soft);Thin liquid Liquids provided via: Cup;Straw Medication Administration: Whole meds with puree Supervision: Patient able to self feed;Staff to assist with self feeding Compensations: Slow rate;Small sips/bites Postural Changes and/or Swallow Maneuvers: Seated upright 90 degrees              Oral Care Recommendations: Oral care BID Plan: All goals met    GO     [name] 02/22/2015, 11:59 AM

## 2015-02-23 LAB — BASIC METABOLIC PANEL
Anion gap: 7 (ref 5–15)
BUN: 26 mg/dL — ABNORMAL HIGH (ref 6–20)
CHLORIDE: 105 mmol/L (ref 101–111)
CO2: 24 mmol/L (ref 22–32)
CREATININE: 0.66 mg/dL (ref 0.44–1.00)
Calcium: 8.2 mg/dL — ABNORMAL LOW (ref 8.9–10.3)
Glucose, Bld: 95 mg/dL (ref 65–99)
Potassium: 4.4 mmol/L (ref 3.5–5.1)
Sodium: 136 mmol/L (ref 135–145)

## 2015-02-23 LAB — BCR-ABL1, CML/ALL, PCR, QUANT

## 2015-02-23 LAB — TYPE AND SCREEN
ABO/RH(D): O POS
Antibody Screen: NEGATIVE
UNIT DIVISION: 0
UNIT DIVISION: 0
Unit division: 0
Unit division: 0
Unit division: 0

## 2015-02-23 MED ORDER — PANTOPRAZOLE SODIUM 40 MG PO TBEC
40.0000 mg | DELAYED_RELEASE_TABLET | Freq: Two times a day (BID) | ORAL | Status: DC
  Administered 2015-02-23: 40 mg via ORAL
  Filled 2015-02-23: qty 1

## 2015-02-23 MED ORDER — PANTOPRAZOLE SODIUM 40 MG PO TBEC: 40.0000 mg | DELAYED_RELEASE_TABLET | Freq: Two times a day (BID) | ORAL | Status: AC

## 2015-02-23 MED ORDER — ENSURE ENLIVE PO LIQD: 237.0000 mL | Freq: Three times a day (TID) | ORAL | Status: AC

## 2015-02-23 MED ORDER — DILTIAZEM HCL ER 60 MG PO CP12: 60.0000 mg | ORAL_CAPSULE | Freq: Two times a day (BID) | ORAL | Status: AC

## 2015-02-23 MED ORDER — POLYETHYLENE GLYCOL 3350 17 GM/SCOOP PO POWD: 1.0000 | Freq: Once | ORAL | Status: AC

## 2015-02-23 MED ORDER — DIGOXIN 125 MCG PO TABS: 0.1250 mg | ORAL_TABLET | Freq: Every day | ORAL | Status: AC

## 2015-02-23 MED ORDER — POLYVINYL ALCOHOL 1.4 % OP SOLN: 1.0000 [drp] | OPHTHALMIC | Status: AC | PRN

## 2015-02-23 NOTE — Clinical Social Work Placement (Signed)
   CLINICAL SOCIAL WORK PLACEMENT  NOTE  Date:  02/23/2015  Patient Details  Name: [name] MRN: [id number] Date of Birth: July 01, 1923  Clinical Social Work is seeking post-discharge placement for this patient at the [place] level of care (*CSW will initial, date and re-position this form in  chart as items are completed):  Yes   Patient/family provided with [facility]'s list of facilities offering this level of care within the geographic area requested by the patient (or if unable, by the patient's family).  Yes   Patient/family informed of their freedom to choose among providers that offer the needed level of care, that participate in Medicare, Medicaid or managed care program needed by the patient, have an available bed and are willing to accept the patient.  Yes   Patient/family informed of [facility]'s ownership interest in [facility] and [facility], as well as of the fact that they are under no obligation to receive care at these facilities.  PASRR submitted to EDS on 02/17/15     PASRR number received on 02/17/15     Existing PASRR number confirmed on       FL2 transmitted to all facilities in geographic area requested by pt/family on 02/17/15     FL2 transmitted to all facilities within larger geographic area on       Patient informed that his/her managed care company has contracts with or will negotiate with certain facilities, including the following:        Yes   Patient/family informed of bed offers received.  Patient chooses bed at [facility]     Physician recommends and patient chooses bed at      Patient to be transferred to [facility] on 02/23/15.  Patient to be transferred to facility by [facility]     Patient family notified on 02/23/15 of transfer.  Name of family member notified:  NIECE     PHYSICIAN       Additional Comment: Pt / niece in agreement with d/c to [facility]  today. Plan reviewed with niece. Pt being sent for [facility] Re[facility]. If pt is unable to participate or when pt stops progressing  in PT, insurance will not cover placement. Pvt pay vs medicaid reviewed. Palliative Care services also recommended, as needed. SNF is aware plan has been reviewed with family. [facility] transport required. D/c Summary sent to SNF for review prior to dc.   _______________________________________________ Luretha Rued, LCSW  [phone] 02/23/2015, 10:48 AM

## 2015-02-23 NOTE — Discharge Summary (Addendum)
Physician Discharge Summary  Kc Sedlak HQP:[id number] DOB: 02-15-1923 DOA: 02/09/2015  PCP: [name], MD  Admit date: 02/09/2015 Discharge date: 02/23/2015  Time spent: 45 minutes  Recommendations for Outpatient Follow-up:  1. Would recommend palliative care follow the patient at skilled nursing facility as may be hospice eligible given ECOG status 3-4. 2. She is DO NOT RESUSCITATE and family is deciding on DNH/DNI/DNR status and hospice  3. We have simplified multiple medications 4. Continue digoxin 0.125, Cardizem 60 every 12 5. Consideration should be given for MiraLAX being when necessary as she had a GI bleed this hospital visit 6. Patient should visit with Dr. Marin Olp as needed as an outpatient from an oncology perspective--various lab work such as BCR, AML, CLL panel are pending still  Discharge Diagnoses:  Principal Problem:   Atrial fibrillation with RVR Active Problems:   Atrial fibrillation   Acute neutrophilic dermatosis   Chronic diastolic heart failure   Constipation - functional   Microcytic anemia   Asymptomatic bacteriuria   Chronic atrial fibrillation   Leukocytosis   PNA (pneumonia)   Myelodysplastic syndrome   HCAP (healthcare-associated pneumonia)   Bloody stool   Acute blood loss anemia   Palliative care encounter   DNR (do not resuscitate)   Weakness generalized   Discharge Condition: Guarded  Diet recommendation: Liberalize diet as hospice eligible  Filed Weights   02/21/15 0600 02/22/15 0500 02/23/15 0500  Weight: 58 kg (127 lb 13.9 oz) 58.7 kg (129 lb 6.6 oz) 59.5 kg (131 lb 2.8 oz)    History of present illness:  81 y.o. female with A. 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 CT scan of the abdomen and pelvis was  suspicious for rectal stricture. Rectal exam showed rectal impaction and disimpaction was done by the ER doctor, Dr. Darl Householder. Stool was heme positive.   She was noted to have A. fib with RVR and was started on Cardizem infusion. [facility] was consulted by the ER and per request, she was admitted to the hospitalist group. She was started on Laxatives to help prep her for sigmoidoscopy but she was quite agitated overnight and did not take the prep. Despite being given enemas, she had minimal stool output. She underwent a sigmoidoscopy on 7/25. Initial exam prior to this procedure revealed no abnormalities in the rectum. Scope was advanced up to the splenic flexure and no lesions and no stricture was noted.  Did have urinary retention on 7/23-Foley catheter placed - 1000 cc of urine emptied. Started on empiric Rocephin on 7/24 - UA was noted to be positive on admission  On the evening after the sigmoidoscopy on th 25th, the patient developed a severe congested cough. The family notes that she had a mild cough prior to admission but it was definitely much worse on that evening.  A chest x-ray was done on the morning of the 26th and it revealed a patchy sided infiltrate.  Treatment for pneumonia was begun with ceftaz and vancomycin-she completed a full course of antibiotics on 02/22/15 for treatment of healthcare associated pneumonia  On 7/30, she was noted to have dropped her Hb by 3 gm from the day before- she later started having maroon stools  She had ultimately a complicated hospital stay and palliative care was involved in discussions about her  multifactorial failure to thrive, GI bleed, pneumonia, other issues as below. Family is discussing DO NOT RESUSCITATE/DNA/DO NOT INTUBATE and home with hospice eventually once physical therapy is complete at skilled nursing facility  Hospital Course:  Atrial fibrillation with RVR Initially was on Cardizem infusion + Iv Lopressor 2.5 mg QID- switched to oral  Lopressor 25 mg BID on 7/27 - Recommended by cardiology on 7/23 to hold off on anticoagulation due to her advanced age, positive Hemoccults and mental status- they recommended to start aspirin only - As she developed Gi bleeding, and hypotension, we ultimately did hold Lopressor and Cardizem - per cardiology, not a candidate for amiodarone due to ILD - hold off ALL anticoagulation [including aspirin] from here on on discharge to snuff - Currently on digoxin and low-dose short-acting Cardizem 60 mg on discharge  Hypotension - BP dropped to 51O systolic due to above bleed-currently still in the 90s to low 100s range- holding off on hydration as still slightly fluid overloaded  Pneumoniahealthcare associated - according to the family, symptoms started prior to admission and got worse during hospitalization therefore this may be community-acquired pneumonia - WBC also noted to be rising from her baseline which is usually in the 20s - not hypoxic- cough is the main complaint -Blood cultures from 7/23 and 7/26 are negative -Completed antibiotics 8/2 which is 7 days of healthcare associated  Acute pulm edema/ acute diastolic CHF - Lasix being given daily since 7/27- Held due to hypotension/ bleeding - she still has pulm edema but no resp distress- start incentive spirometry and mobilize out of bed to reduce atelectasis   Acute lower GI bleed/ Acute blood loss anemia - was hemoccult positive when checked in the ER - Hb noted to drop from 11 to 7 during this hospital stay -family is not aware if she has ever had a colonoscopy had a flex sig earlier in the admission as rectal stricture was seen on CT- no significant findings  - received 2 UPRBC on 7/30 -last bloody stool at 3 AM on 7/31-required 1 more unit of packed red blood cells as hemoglobin drifted down to 6.9-hemoglobin up to 8.4 this morning-  -Conservative approach is recommended and started on twice a day Protonix -she had a small  black stool in the middle of the night-Continue full liquids today-advanced to solid food tomorrow if stable   Constipation - resolved with laxatives - based upon CT report, there was a concern for a rectal stricture  -7/25 no evidence of rectal stricture (noted on CT) on sigmoidoscopy   UTI? -UA positive - urine culture from 7/23 negative   Urinary retention -Removed Foley and gave a voiding trial - she is noted to be voiding appropriately  Hospital induced delirium / Dementia - probable delirium secondary to a 1st generation antihistamine benadryl [had a lot of itching] and being in the hospital  Acute neutrophilic dermatosis(Sweet syndrome)  - Itching/rash (per the meatus patches throughout her body) - apparently was given Benadryl earlier in the admission which caused increased confusion - Rash has improved significantly during the hospital stay  Myelodysplasia - as WBC count continues to rise, have consulted Oncology - she refused to see doctors as outpt and Dr Marin Olp last saw her (only saw her once) on 2012- per Dr Marin Olp, the [facility] diagosis is inaccurate- she likely had myelodysplasia- he is doing further work up via blood work - he does not want to put her through a bone marrow biopsy due to her frailty -  his note states that 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 -Workup including JAK-2 was negative and PCR-ABL 1, CML/ALL panel is pending    Discharge Exam: Filed Vitals:   02/23/15 0600  BP: 120/61  Pulse: 69  Temp:   Resp: 20    Alert oriented in no distress, feels better than previously, mildly confused but looks close to her normal self No JVD, no bruit, dentition good S1-S2 no murmur rub or gallop, on telemetry 3-1 A. fib rate controlled Soft nontender nondistended abdomen Lower extremities and nontender nonswollen*  Discharge Instructions   Discharge Instructions    Diet - low sodium heart healthy    Complete by:  As  directed      Increase activity slowly    Complete by:  As directed           Current Discharge Medication List    START taking these medications   Details  digoxin (LANOXIN) 0.125 MG tablet Take 1 tablet (0.125 mg total) by mouth daily. Qty: 3 tablet    diltiazem (CARDIZEM SR) 60 MG 12 hr capsule Take 1 capsule (60 mg total) by mouth every 12 (twelve) hours.    feeding supplement, ENSURE ENLIVE, (ENSURE ENLIVE) LIQD Take 237 mLs by mouth 3 (three) times daily between meals. Qty: 237 mL, Refills: 12    pantoprazole (PROTONIX) 40 MG tablet Take 1 tablet (40 mg total) by mouth 2 (two) times daily.   Associated Diagnoses: Chronic atrial fibrillation; Acute neutrophilic dermatosis    polyethylene glycol powder (MIRALAX) powder Take 255 g by mouth once. Qty: 255 g, Refills: 0    polyvinyl alcohol (LIQUIFILM TEARS) 1.4 % ophthalmic solution Place 1 drop into both eyes as needed for dry eyes. Qty: 15 mL, Refills: 0      STOP taking these medications     Sennosides (EX-LAX PO)        No Known Allergies Follow-up Information    Follow up with [name], MD. Go in 1 week.   Specialty:  Family Medicine   Why:  Follow up appointment on Fri. Aug.5th at 11:45   Contact information:   [address] [place] [phone]        The results of significant diagnostics from this hospitalization (including imaging, microbiology, ancillary and laboratory) are listed below for reference.    Significant Diagnostic Studies: Dg Chest 2 View  02/15/2015   CLINICAL DATA:  Shortness of breath. History of chronic myelocytic leukemia.  EXAM: CHEST  2 VIEW  COMPARISON:  February 12, 2015 and January 21, 2011  FINDINGS: There is airspace consolidation in the left mid and lower lung zones. The right lung is clear. Heart is borderline prominent with pulmonary vascularity within normal limits. No adenopathy. Bones are osteoporotic. There is moderate compression of a mid  thoracic vertebral body.  IMPRESSION: Patchy infiltrate left mid lower lung zones. Lungs otherwise clear. No change in cardiac silhouette. Wedge compression fracture mid thoracic region, chronic.   Electronically Signed   By: Lowella Grip III M.D.   On: 02/15/2015 10:52   Ct Abdomen Pelvis W Contrast  02/09/2015   CLINICAL DATA:  81 year old female with a history of constipation for 8 days.  EXAM: CT ABDOMEN AND PELVIS WITH CONTRAST  TECHNIQUE: Multidetector CT imaging of the abdomen and pelvis was performed using the standard protocol following bolus administration of intravenous contrast.  CONTRAST:  80m OMNIPAQUE IOHEXOL 300 MG/ML  SOLN  COMPARISON:  None.  FINDINGS: Lower chest:  Unremarkable appearance of the soft tissues of the chest wall.  Heart size within normal limits.  No pericardial fluid/thickening.  No lower mediastinal adenopathy.  Unremarkable appearance of the distal esophagus.  No hiatal hernia.  No confluent airspace disease, pleural fluid, or pneumothorax within visualized lung. Chronic changes of the lung bases.  Abdomen/pelvis:  Unremarkable appearance of liver and spleen.  Unremarkable appearance of bilateral adrenal glands.  No peripancreatic or pericholecystic fluid or inflammatory changes.  No radio-opaque gallstones.  No intrahepatic or extrahepatic biliary ductal dilatation.  No intra-peritoneal free air or significant free-fluid.  Very large stool burden with formed stool throughout the length of the colon. Formed stool involves the rectum. There is a narrowed segment of rectum. Hyper enhancement of the mucosa of the rectal wall with inflammatory changes and the meso rectum. No evidence of focal fluid collection or abscess. Thickening of the fascia planes within the anatomic pelvis.  Formed stool/fecalization of the distal small bowel.  No free air or significant free fluid within the abdomen.  Right Kidney/Ureter:  No hydronephrosis. No nephrolithiasis. No perinephric  stranding. Unremarkable course of the right ureter.  Left Kidney/Ureter:  No hydronephrosis. No nephrolithiasis. No perinephric stranding.  Unremarkable course of the left ureter.  Unremarkable appearance of the urinary bladder.  Surgical changes of hysterectomy.  No significant vascular calcification. No aneurysm or periaortic fluid identified.  Musculoskeletal:  No displaced fracture identified.  Advanced degenerative changes of the visualized spine. Compression fracture of L2 and L4 of indeterminate age. Each of these levels demonstrates approximately 30% height loss.  Osteopenia.  No displaced fracture identified.  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 Recommend correlation with a history of obstipation/constipation. No evidence of perforation or abscess.  There is a narrowed segment/stricture of the rectum, just above the anorectal junction. Correlation with colonoscopy is recommended to rule out either a benign or malignant stricture.  Signed,  Dulcy Fanny. Earleen Newport, DO  [facility]  [facility]   Electronically Signed   By: Corrie Mckusick D.O.   On: 02/09/2015 16:06   Dg Chest Port 1 View  02/20/2015   CLINICAL DATA:  Acute hypoxia.  EXAM: PORTABLE CHEST - 1 VIEW  COMPARISON:  02/19/2015 and prior radiographs  FINDINGS: Mild cardiomegaly again noted.  A small left pleural effusion and left lower lung consolidation/atelectasis again noted.  Interstitial opacities, left-greater-than-right, are again noted and may represent edema.  There is no evidence of pneumothorax.  There has been little interval change since the prior study.  IMPRESSION: Little interval change with continued small left pleural effusion, left lower lung consolidation/ atelectasis and question interstitial edema.   Electronically Signed   By: Margarette Canada M.D.    On: 02/20/2015 08:05   Dg Chest Port 1 View  02/19/2015   CLINICAL DATA:  Acute shortness of breath and lethargy today.  EXAM: PORTABLE CHEST - 1 VIEW  COMPARISON:  02/18/2015 and prior chest radiographs dating back to 12/06/2010  FINDINGS: Cardiomegaly again identified.  Left lower lung consolidation/airspace disease/ atelectasis noted.  Interstitial opacities are again identified and may represent interstitial edema.  There is no evidence of pneumothorax.  A probable small left pleural effusion noted.  No acute bony abnormalities are identified.  IMPRESSION: Little significant change from the prior study with continued left lower lung consolidation/atelectasis  and interstitial opacities which may represent edema. Question small left pleural effusion.   Electronically Signed   By: Margarette Canada M.D.   On: 02/19/2015 10:14   Dg Chest Port 1 View  02/18/2015   CLINICAL DATA:  Pneumonia. History of interstitial lung disease. History of leukemia.  EXAM: PORTABLE CHEST - 1 VIEW  COMPARISON:  Chest x-rays dated 02/15/2015 and 02/12/2015  FINDINGS: Mild cardiomegaly is unchanged. Again noted is mild central pulmonary vascular congestion and bilateral interstitial thickening, perhaps slightly worsened on the left compared to the previous studies. Suspect small left pleural effusion and left basilar atelectasis.  IMPRESSION: 1. Cardiomegaly with central pulmonary vascular congestion and bilateral interstitial edema which appears slightly worsened on the left when compared to previous exams. Suspect a mild volume overload/CHF superimposed on chronic interstitial fibrosis, and slightly worsened fluid status in the short-term interval. 2. Probable small left pleural effusion and left basilar atelectasis.   Electronically Signed   By: Franki Cabot M.D.   On: 02/18/2015 10:33   Dg Chest Port 1 View  02/12/2015   CLINICAL DATA:  Three day history of cough, shortness of breath and generalized weakness. Current history of  chronic diastolic heart failure.  EXAM: PORTABLE CHEST - 1 VIEW  COMPARISON:  01/21/2011 and earlier.  FINDINGS: Cardiac silhouette moderately enlarged, increased in size since the prior examination. Thoracic aorta atherosclerotic and mildly tortuous, unchanged. Hilar and mediastinal contours otherwise unremarkable. Emphysematous changes throughout both lungs. Pulmonary venous hypertension with minimal interstitial pulmonary edema as there are scattered Kerley B-lines. Suboptimal inspiration with mild atelectasis in the lung bases, left greater than right.  IMPRESSION: 1. Mild CHF superimposed upon baseline COPD/emphysema. 2. Suboptimal inspiration accounts for mild bibasilar atelectasis.   Electronically Signed   By: Evangeline Dakin M.D.   On: 02/12/2015 18:36    Microbiology: Recent Results (from the past 240 hour(s))  Culture, blood (routine x 2) Call MD if unable to obtain prior to antibiotics being given     Status: None   Collection Time: 02/15/15  1:15 PM  Result Value Ref Range Status   Specimen Description BLOOD LEFT ARM  Final   Special Requests BOTTLES DRAWN AEROBIC AND ANAEROBIC 10CC  Final   Culture   Final    NO GROWTH 5 DAYS Performed at [facility]    Report Status 02/20/2015 FINAL  Final  Culture, blood (routine x 2) Call MD if unable to obtain prior to antibiotics being given     Status: None   Collection Time: 02/15/15  1:20 PM  Result Value Ref Range Status   Specimen Description BLOOD RIGHT ARM  Final   Special Requests BOTTLES DRAWN AEROBIC AND ANAEROBIC 10CC  Final   Culture   Final    NO GROWTH 5 DAYS Performed at [facility]    Report Status 02/20/2015 FINAL  Final     Labs: Basic Metabolic Panel:  Recent Labs Lab 02/19/15 0555 02/20/15 0358 02/21/15 0620 02/22/15 0330 02/23/15 0341  NA 136 139 135 136 136  K 3.9 4.2 4.1 4.1 4.4  CL 103 106 105 106 105  CO2 26 24 24 24 24   GLUCOSE 96 110* 98 101* 95  BUN 37* 62* 49* 36* 26*   CREATININE 0.86 0.95 0.78 0.72 0.66  CALCIUM 7.8* 7.8* 7.7* 7.8* 8.2*   Liver Function Tests: No results for input(s): AST, ALT, ALKPHOS, BILITOT, PROT, ALBUMIN in the last 168 hours. No results for input(s): LIPASE, AMYLASE in the  last 168 hours. No results for input(s): AMMONIA in the last 168 hours. CBC:  Recent Labs Lab 02/20/15 1812 02/21/15 0015 02/21/15 0620 02/22/15 0330 02/23/15 0341  WBC 84.0* 82.5* 77.2* 67.0* 61.9*  HGB 7.8* 6.9* 8.4* 8.1* 13.0  HCT 23.6* 21.0* 25.7* 25.2* 39.8  MCV 83.7 84.0 84.5 86.6 87.7  PLT 512* 488* 459* 499* 481*   Cardiac Enzymes: No results for input(s): CKTOTAL, CKMB, CKMBINDEX, TROPONINI in the last 168 hours. BNP: BNP (last 3 results)  Recent Labs  02/18/15 0835  BNP 191.5*    ProBNP (last 3 results) No results for input(s): PROBNP in the last 8760 hours.  CBG:  Recent Labs Lab 02/22/15 0743 02/22/15 1120  GLUCAP 99 111*       Signed:  Nita Sells  [facility] 02/23/2015, 8:12 AM

## 2015-02-23 NOTE — Progress Notes (Addendum)
Pt discharged to SNF via EMS. Report given to paramedic. VS stable, pt is not in pain and is not in acute distress. Sister and niece at the bedside and present for discharge. AVS and discharge Paperwork given to EMS

## 2015-02-24 ENCOUNTER — Other Ambulatory Visit: Payer: Self-pay | Admitting: Hematology & Oncology

## 2015-02-24 DIAGNOSIS — D469 Myelodysplastic syndrome, unspecified: Secondary | ICD-10-CM

## 2015-02-24 LAB — CBC
HCT: 39.8 % (ref 36.0–46.0)
HEMOGLOBIN: 13 g/dL (ref 12.0–15.0)
MCH: 28.6 pg (ref 26.0–34.0)
MCHC: 32.7 g/dL (ref 30.0–36.0)
MCV: 87.7 fL (ref 78.0–100.0)
PLATELETS: 481 10*3/uL — AB (ref 150–400)
RBC: 4.54 MIL/uL (ref 3.87–5.11)
RDW: 19.1 % — ABNORMAL HIGH (ref 11.5–15.5)
WBC: 61.9 10*3/uL (ref 4.0–10.5)

## 2015-03-08 ENCOUNTER — Ambulatory Visit: Payer: Medicare Other | Admitting: Family

## 2015-03-08 ENCOUNTER — Other Ambulatory Visit: Payer: Medicare Other

## 2015-03-24 ENCOUNTER — Telehealth: Payer: Self-pay | Admitting: *Deleted

## 2015-03-24 NOTE — Telephone Encounter (Signed)
Yvette at [facility] states that patient and family have called in hospice services. They would like to know if Dr Marin Olp would be attending. Spoke to Dr Marin Olp who agrees to take attending status.

## 2015-05-03 ENCOUNTER — Telehealth: Payer: Self-pay | Admitting: *Deleted

## 2015-05-03 NOTE — Telephone Encounter (Signed)
Onyeje RN, from hospice, called to notify office that patient states she has not had a bowel movement since 04/12/15. The hospice RN attempted to digitally disimpact the patient, but stated the rectum was clear. She states the patient is asymptomatic. She has good bowel sounds. Dr Marin Olp is aware. He doesn't want any intervention at this point. Asked the Hospice RN to call the office if she became symptomatic.

## 2015-05-24 DEATH — deceased
